# Patient Record
Sex: Male | Born: 1945
Health system: Southern US, Community
[De-identification: ages and names within clinical notes are randomized; demographics above are authoritative.]

## PROBLEM LIST (undated history)

## (undated) DIAGNOSIS — M25519 Pain in unspecified shoulder: Secondary | ICD-10-CM

## (undated) DIAGNOSIS — K5900 Constipation, unspecified: Secondary | ICD-10-CM

## (undated) DIAGNOSIS — R51 Headache: Secondary | ICD-10-CM

## (undated) DIAGNOSIS — R739 Hyperglycemia, unspecified: Secondary | ICD-10-CM

## (undated) DIAGNOSIS — M810 Age-related osteoporosis without current pathological fracture: Secondary | ICD-10-CM

## (undated) DIAGNOSIS — R972 Elevated prostate specific antigen [PSA]: Secondary | ICD-10-CM

## (undated) DIAGNOSIS — E782 Mixed hyperlipidemia: Secondary | ICD-10-CM

## (undated) DIAGNOSIS — M542 Cervicalgia: Secondary | ICD-10-CM

## (undated) DIAGNOSIS — G4733 Obstructive sleep apnea (adult) (pediatric): Secondary | ICD-10-CM

## (undated) DIAGNOSIS — R519 Headache, unspecified: Secondary | ICD-10-CM

## (undated) DIAGNOSIS — T148XXA Other injury of unspecified body region, initial encounter: Secondary | ICD-10-CM

## (undated) DIAGNOSIS — E079 Disorder of thyroid, unspecified: Secondary | ICD-10-CM

## (undated) DIAGNOSIS — M1990 Unspecified osteoarthritis, unspecified site: Secondary | ICD-10-CM

## (undated) DIAGNOSIS — H269 Unspecified cataract: Secondary | ICD-10-CM

## (undated) DIAGNOSIS — G4730 Sleep apnea, unspecified: Secondary | ICD-10-CM

## (undated) DIAGNOSIS — M549 Dorsalgia, unspecified: Secondary | ICD-10-CM

## (undated) DIAGNOSIS — F419 Anxiety disorder, unspecified: Secondary | ICD-10-CM

## (undated) DIAGNOSIS — F32A Depression, unspecified: Secondary | ICD-10-CM

## (undated) DIAGNOSIS — G629 Polyneuropathy, unspecified: Secondary | ICD-10-CM

## (undated) DIAGNOSIS — G43109 Migraine with aura, not intractable, without status migrainosus: Principal | ICD-10-CM

## (undated) DIAGNOSIS — Z8619 Personal history of other infectious and parasitic diseases: Secondary | ICD-10-CM

## (undated) DIAGNOSIS — N401 Enlarged prostate with lower urinary tract symptoms: Secondary | ICD-10-CM

## (undated) DIAGNOSIS — H3530 Unspecified macular degeneration: Secondary | ICD-10-CM

## (undated) DIAGNOSIS — R3915 Urgency of urination: Secondary | ICD-10-CM

## (undated) DIAGNOSIS — Z86010 Personal history of colonic polyps: Secondary | ICD-10-CM

## (undated) DIAGNOSIS — Z9989 Dependence on other enabling machines and devices: Secondary | ICD-10-CM

## (undated) HISTORY — DX: Urgency of urination: R39.15

## (undated) HISTORY — DX: Obstructive sleep apnea (adult) (pediatric): G47.33

## (undated) HISTORY — DX: Constipation, unspecified: K59.00

## (undated) HISTORY — DX: Dorsalgia, unspecified: M54.9

## (undated) HISTORY — DX: Hyperglycemia, unspecified: R73.9

## (undated) HISTORY — PX: TONSILLECTOMY: SUR1361

## (undated) HISTORY — DX: Unspecified macular degeneration: H35.30

## (undated) HISTORY — DX: Depression, unspecified: F32.A

## (undated) HISTORY — DX: Unspecified osteoarthritis, unspecified site: M19.90

## (undated) HISTORY — DX: Headache: R51

## (undated) HISTORY — DX: Pain in unspecified shoulder: M25.519

## (undated) HISTORY — DX: Other injury of unspecified body region, initial encounter: T14.8XXA

## (undated) HISTORY — PX: SKIN GRAFT: SHX250

## (undated) HISTORY — DX: Disorder of thyroid, unspecified: E07.9

## (undated) HISTORY — DX: Dependence on other enabling machines and devices: Z99.89

## (undated) HISTORY — DX: Anxiety disorder, unspecified: F41.9

## (undated) HISTORY — DX: Personal history of other infectious and parasitic diseases: Z86.19

## (undated) HISTORY — DX: Benign prostatic hyperplasia with lower urinary tract symptoms: N40.1

## (undated) HISTORY — DX: Sleep apnea, unspecified: G47.30

## (undated) HISTORY — DX: Polyneuropathy, unspecified: G62.9

## (undated) HISTORY — DX: Elevated prostate specific antigen (PSA): R97.20

## (undated) HISTORY — DX: Unspecified cataract: H26.9

## (undated) HISTORY — DX: Personal history of colonic polyps: Z86.010

## (undated) HISTORY — DX: Age-related osteoporosis without current pathological fracture: M81.0

## (undated) HISTORY — DX: Cervicalgia: M54.2

## (undated) HISTORY — DX: Headache, unspecified: R51.9

## (undated) HISTORY — DX: Mixed hyperlipidemia: E78.2

## (undated) HISTORY — DX: Migraine with aura, not intractable, without status migrainosus: G43.109

---
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
## 2004-04-06 ENCOUNTER — Encounter: Payer: Self-pay | Admitting: Pulmonary Disease

## 2005-07-30 ENCOUNTER — Ambulatory Visit: Payer: Self-pay | Admitting: Internal Medicine

## 2005-09-24 ENCOUNTER — Ambulatory Visit: Payer: Self-pay | Admitting: Internal Medicine

## 2006-02-11 ENCOUNTER — Ambulatory Visit: Payer: Self-pay | Admitting: Internal Medicine

## 2006-02-26 ENCOUNTER — Ambulatory Visit: Payer: Self-pay | Admitting: Internal Medicine

## 2007-05-20 ENCOUNTER — Ambulatory Visit: Payer: Self-pay | Admitting: Internal Medicine

## 2007-05-20 ENCOUNTER — Encounter: Payer: Self-pay | Admitting: Internal Medicine

## 2008-02-03 ENCOUNTER — Encounter: Payer: Self-pay | Admitting: Internal Medicine

## 2008-07-28 ENCOUNTER — Ambulatory Visit: Payer: Self-pay | Admitting: Pulmonary Disease

## 2008-07-28 DIAGNOSIS — G4733 Obstructive sleep apnea (adult) (pediatric): Secondary | ICD-10-CM | POA: Insufficient documentation

## 2008-11-12 ENCOUNTER — Ambulatory Visit: Payer: Self-pay | Admitting: Family Medicine

## 2008-11-12 DIAGNOSIS — L255 Unspecified contact dermatitis due to plants, except food: Secondary | ICD-10-CM

## 2009-06-06 ENCOUNTER — Ambulatory Visit: Payer: Self-pay | Admitting: Internal Medicine

## 2013-12-24 LAB — HM COLONOSCOPY

## 2014-09-01 ENCOUNTER — Ambulatory Visit: Payer: Self-pay | Admitting: Internal Medicine

## 2014-09-01 DIAGNOSIS — R4689 Other symptoms and signs involving appearance and behavior: Secondary | ICD-10-CM | POA: Insufficient documentation

## 2014-09-01 DIAGNOSIS — Z0289 Encounter for other administrative examinations: Secondary | ICD-10-CM

## 2015-04-21 DIAGNOSIS — H43813 Vitreous degeneration, bilateral: Secondary | ICD-10-CM | POA: Diagnosis not present

## 2015-04-21 DIAGNOSIS — H524 Presbyopia: Secondary | ICD-10-CM | POA: Diagnosis not present

## 2015-04-21 DIAGNOSIS — H2513 Age-related nuclear cataract, bilateral: Secondary | ICD-10-CM | POA: Diagnosis not present

## 2015-04-21 DIAGNOSIS — H3530 Unspecified macular degeneration: Secondary | ICD-10-CM | POA: Diagnosis not present

## 2015-06-24 DIAGNOSIS — R972 Elevated prostate specific antigen [PSA]: Secondary | ICD-10-CM | POA: Diagnosis not present

## 2015-06-24 DIAGNOSIS — N400 Enlarged prostate without lower urinary tract symptoms: Secondary | ICD-10-CM | POA: Diagnosis not present

## 2015-07-01 DIAGNOSIS — R972 Elevated prostate specific antigen [PSA]: Secondary | ICD-10-CM | POA: Diagnosis not present

## 2015-07-01 DIAGNOSIS — N401 Enlarged prostate with lower urinary tract symptoms: Secondary | ICD-10-CM | POA: Diagnosis not present

## 2015-07-01 DIAGNOSIS — N529 Male erectile dysfunction, unspecified: Secondary | ICD-10-CM | POA: Diagnosis not present

## 2015-09-24 DIAGNOSIS — H019 Unspecified inflammation of eyelid: Secondary | ICD-10-CM | POA: Diagnosis not present

## 2015-09-24 DIAGNOSIS — H5712 Ocular pain, left eye: Secondary | ICD-10-CM | POA: Diagnosis not present

## 2015-09-24 DIAGNOSIS — H00016 Hordeolum externum left eye, unspecified eyelid: Secondary | ICD-10-CM | POA: Diagnosis not present

## 2015-09-26 DIAGNOSIS — H00014 Hordeolum externum left upper eyelid: Secondary | ICD-10-CM | POA: Diagnosis not present

## 2016-01-23 DIAGNOSIS — R972 Elevated prostate specific antigen [PSA]: Secondary | ICD-10-CM | POA: Diagnosis not present

## 2016-02-08 ENCOUNTER — Encounter: Payer: Self-pay | Admitting: *Deleted

## 2016-02-08 ENCOUNTER — Telehealth: Payer: Self-pay | Admitting: *Deleted

## 2016-02-08 DIAGNOSIS — R972 Elevated prostate specific antigen [PSA]: Secondary | ICD-10-CM | POA: Diagnosis not present

## 2016-02-08 DIAGNOSIS — N529 Male erectile dysfunction, unspecified: Secondary | ICD-10-CM | POA: Diagnosis not present

## 2016-02-08 DIAGNOSIS — N401 Enlarged prostate with lower urinary tract symptoms: Secondary | ICD-10-CM | POA: Diagnosis not present

## 2016-02-08 DIAGNOSIS — N138 Other obstructive and reflux uropathy: Secondary | ICD-10-CM | POA: Diagnosis not present

## 2016-02-08 NOTE — Telephone Encounter (Signed)
Pt unavailable at time of call. Pt will try to return call later today.

## 2016-02-08 NOTE — Telephone Encounter (Signed)
Pre-Visit Call completed with patient and chart updated.   Pre-Visit Info documented in Specialty Comments under [employer].    

## 2016-02-09 ENCOUNTER — Ambulatory Visit (INDEPENDENT_AMBULATORY_CARE_PROVIDER_SITE_OTHER): Payer: Medicare Other | Admitting: Family Medicine

## 2016-02-09 ENCOUNTER — Encounter: Payer: Self-pay | Admitting: Family Medicine

## 2016-02-09 VITALS — BP 122/84 | HR 67 | Temp 98.3°F | Ht 65.0 in | Wt 190.0 lb

## 2016-02-09 DIAGNOSIS — Z860100 Personal history of colon polyps, unspecified: Secondary | ICD-10-CM

## 2016-02-09 DIAGNOSIS — N401 Enlarged prostate with lower urinary tract symptoms: Secondary | ICD-10-CM | POA: Diagnosis not present

## 2016-02-09 DIAGNOSIS — G4733 Obstructive sleep apnea (adult) (pediatric): Secondary | ICD-10-CM

## 2016-02-09 DIAGNOSIS — M25512 Pain in left shoulder: Secondary | ICD-10-CM

## 2016-02-09 DIAGNOSIS — R972 Elevated prostate specific antigen [PSA]: Secondary | ICD-10-CM | POA: Diagnosis not present

## 2016-02-09 DIAGNOSIS — R3915 Urgency of urination: Secondary | ICD-10-CM

## 2016-02-09 DIAGNOSIS — N138 Other obstructive and reflux uropathy: Secondary | ICD-10-CM | POA: Insufficient documentation

## 2016-02-09 DIAGNOSIS — H3530 Unspecified macular degeneration: Secondary | ICD-10-CM

## 2016-02-09 DIAGNOSIS — G43109 Migraine with aura, not intractable, without status migrainosus: Secondary | ICD-10-CM | POA: Diagnosis not present

## 2016-02-09 HISTORY — DX: Personal history of colonic polyps: Z86.010

## 2016-02-09 HISTORY — DX: Migraine with aura, not intractable, without status migrainosus: G43.109

## 2016-02-09 HISTORY — DX: Personal history of colon polyps, unspecified: Z86.0100

## 2016-02-09 HISTORY — DX: Benign prostatic hyperplasia with lower urinary tract symptoms: N40.1

## 2016-02-09 MED ORDER — CYCLOBENZAPRINE HCL 10 MG PO TABS
10.0000 mg | ORAL_TABLET | Freq: Every evening | ORAL | Status: DC | PRN
Start: 2016-02-09 — End: 2016-08-10

## 2016-02-09 NOTE — Assessment & Plan Note (Signed)
Used Flomax previously and it worked somewhat but not enough so he chose to stop. Is urinating well, but does move his urine well still

## 2016-02-09 NOTE — Assessment & Plan Note (Signed)
Follows with Dr Pete Glatter PSA generally between 6-7.5, more recently 7.9 Has had numerous biopsies all at [facility] and with Dr Pete Glatter all normal

## 2016-02-09 NOTE — Progress Notes (Signed)
Subjective:    Patient ID: Christopher Haynes, male    DOB: September 20, 1946, 70 y.o.   MRN: [id number]  Chief Complaint  Patient presents with  . Establish Care    HPI Patient is in today for new patient appointment. Patient has a past medical history significant for migraine headaches, macular degeneration, sleep apnea, colonic polyps. Is complaining of left shoulder pain. Describes stiffness as well as pain across the shoulder and up into the neck. No falls or recent injury. Follows with the [facility] and they manage his CPAP and mask as well as help him with his medications. He does have occasional optical migraines and has been seen by ophthalmology but no recent flare. Denies CP/palp/SOB/HA/congestion/fevers/GI or GU c/o. Taking meds as prescribed  Past Medical History  Diagnosis Date  . OSA on CPAP   . Headache   . Migraine with visual aura 02/09/2016  . H/O measles   . Thyroid disease     thyroiditis, h/o in 37s  . Elevated PSA   . Benign prostatic hyperplasia (BPH) with urinary urgency 02/09/2016  . History of colonic polyps 02/09/2016    [facility] roughly [facility] years ago. Now on 5 year plan  . Macular degeneration of left eye 02/19/2016  . Pain in joint, shoulder region 02/19/2016    Past Surgical History  Procedure Laterality Date  . Tonsillectomy  Age 44  . Skin graft  Age 20  . Ankle fracture surgery  66    Family History  Problem Relation Age of Onset  . Hypertension Mother   . Diabetes Mother   . Colon cancer Father   . Cancer Father     rectal with mets  . Hypertension Brother   . Obesity Son   . Other Son     fatty liver  . Allergic Disorder Son     seasonal  . Stroke Paternal Grandfather     Social History   Social History  . Marital Status: Married    Spouse Name: N/A  . Number of Children: N/A  . Years of Education: N/A   Occupational History  . [employer]    Social History Main Topics  . Smoking status: Never Smoker    . Smokeless tobacco: Not on file  . Alcohol Use: 0.0 oz/week    0 Standard drinks or equivalent per week  . Drug Use: No  . Sexual Activity: Yes     Comment: lives with wife, work in Scientist, research (life sciences) estate, no dietary restrictions   Other Topics Concern  . Not on file   Social History Narrative    Outpatient Prescriptions Prior to Visit  Medication Sig Dispense Refill  . OVER THE COUNTER MEDICATION OTC Iron Supplement     No facility-administered medications prior to visit.    No Active Allergies  Review of Systems  Constitutional: Negative for fever, chills and malaise/fatigue.  HENT: Negative for congestion and hearing loss.   Eyes: Negative for discharge.  Respiratory: Negative for cough, sputum production and shortness of breath.   Cardiovascular: Negative for chest pain, palpitations and leg swelling.  Gastrointestinal: Negative for heartburn, nausea, vomiting, abdominal pain, diarrhea, constipation and blood in stool.  Genitourinary: Negative for dysuria, urgency, frequency and hematuria.  Musculoskeletal: Positive for joint pain. Negative for myalgias, back pain and falls.  Skin: Negative for rash.  Neurological: Negative for dizziness, sensory change, loss of consciousness, weakness and headaches.  Endo/Heme/Allergies: Negative for environmental allergies. Does not bruise/bleed  easily.  Psychiatric/Behavioral: Negative for depression and suicidal ideas. The patient is not nervous/anxious and does not have insomnia.        Objective:    Physical Exam  Constitutional: He is oriented to person, place, and time. He appears well-developed and well-nourished. No distress.  HENT:  Head: Normocephalic and atraumatic.  Nose: Nose normal.  Eyes: Right eye exhibits no discharge. Left eye exhibits no discharge.  Neck: Normal range of motion. Neck supple.  Cardiovascular: Normal rate and regular rhythm.   No murmur heard. Pulmonary/Chest: Effort normal and breath sounds normal.    Abdominal: Soft. Bowel sounds are normal. There is no tenderness.  Musculoskeletal: He exhibits no edema.  Neurological: He is alert and oriented to person, place, and time.  Skin: Skin is warm and dry.  Psychiatric: He has a normal mood and affect.  Nursing note and vitals reviewed.   BP 122/84 mmHg  Pulse 67  Temp(Src) 98.3 F (36.8 C) (Oral)  Ht '5\' 5"'  (1.651 m)  Wt 190 lb (86.183 kg)  BMI 31.62 kg/m2  SpO2 98% Wt Readings from Last 3 Encounters:  02/16/16 184 lb (83.462 kg)  02/09/16 190 lb (86.183 kg)  06/06/09 193 lb 12.8 oz (87.907 kg)     No results found for: WBC, HGB, HCT, PLT, GLUCOSE, CHOL, TRIG, HDL, LDLDIRECT, LDLCALC, ALT, AST, NA, K, CL, CREATININE, BUN, CO2, TSH, PSA, INR, GLUF, HGBA1C, MICROALBUR  No results found for: TSH No results found for: WBC, HGB, HCT, MCV, PLT No results found for: NA, K, CHLORIDE, CO2, GLUCOSE, BUN, CREATININE, BILITOT, ALKPHOS, AST, ALT, PROT, ALBUMIN, CALCIUM, ANIONGAP, EGFR, GFR No results found for: CHOL No results found for: HDL No results found for: LDLCALC No results found for: TRIG No results found for: CHOLHDL No results found for: HGBA1C     Assessment & Plan:   Problem List Items Addressed This Visit    Benign prostatic hyperplasia (BPH) with urinary urgency    Used Flomax previously and it worked somewhat but not enough so he chose to stop. Is urinating well, but does move his urine well still      Elevated PSA    Follows with Dr Felipa Eth PSA generally between 6-7.5, more recently 7.9 Has had numerous biopsies all at [facility] and with Dr Felipa Eth all normal      History of colonic polyps   Macular degeneration of left eye    Follows with opthamology      Migraine with visual aura - Primary    Encouraged increased hydration, 64 ounces of clear fluids daily. Minimize alcohol and caffeine. Eat small frequent meals with lean proteins and complex carbs. Avoid high and low blood sugars. Get adequate sleep, 7-8 hours  a night. Needs exercise daily preferably in the morning.      Relevant Medications   traMADol (ULTRAM) 50 MG tablet   cyclobenzaprine (FLEXERIL) 10 MG tablet   Obstructive sleep apnea    Uses CPAP with full mask[facility] and [facility]ly      Pain in joint, shoulder region    Shoulder and neck. No acute injury. Encouraged moist heat and gentle stretching as tolerated. May try NSAIDs and prescription meds as directed and report if symptoms worsen or seek immediate care. May use Flexeril prn         I am having Mr. Palmeri start on cyclobenzaprine. I am also having him maintain his OVER THE COUNTER MEDICATION, diazepam, traMADol, and Omega-3 Fatty Acids (FISH  OIL PO).  Meds ordered this encounter  Medications  . diazepam (VALIUM) 10 MG tablet    Sig: Take 1 tablet by mouth daily as needed.  . traMADol (ULTRAM) 50 MG tablet    Sig: Take 1 tablet by mouth daily as needed.  . Omega-3 Fatty Acids (FISH OIL PO)    Sig: Take by mouth daily.  . cyclobenzaprine (FLEXERIL) 10 MG tablet    Sig: Take 1 tablet (10 mg total) by mouth at bedtime as needed for muscle spasms.    Dispense:  30 tablet    Refill:  1     Penni Homans, MD

## 2016-02-09 NOTE — Progress Notes (Signed)
Pre visit review using our clinic review tool, if applicable. No additional management support is needed unless otherwise documented below in the visit note. 

## 2016-02-09 NOTE — Patient Instructions (Addendum)
Salon Pas gel or patches regular or Lidocaine and/or Aspercreme gel or lidocaine patches   Back Pain, Adult Back pain is very common in adults.The cause of back pain is rarely dangerous and the pain often gets better over time.The cause of your back pain may not be known. Some common causes of back pain include:  Strain of the muscles or ligaments supporting the spine.  Wear and tear (degeneration) of the spinal disks.  Arthritis.  Direct injury to the back. For many people, back pain may return. Since back pain is rarely dangerous, most people can learn to manage this condition on their own. HOME CARE INSTRUCTIONS Watch your back pain for any changes. The following actions may help to lessen any discomfort you are feeling:  Remain active. It is stressful on your back to sit or stand in one place for long periods of time. Do not sit, drive, or stand in one place for more than 30 minutes at a time. Take short walks on even surfaces as soon as you are able.Try to increase the length of time you walk each day.  Exercise regularly as directed by your health care provider. Exercise helps your back heal faster. It also helps avoid future injury by keeping your muscles strong and flexible.  Do not stay in bed.Resting more than 1-2 days can delay your recovery.  Pay attention to your body when you bend and lift. The most comfortable positions are those that put less stress on your recovering back. Always use proper lifting techniques, including:  Bending your knees.  Keeping the load close to your body.  Avoiding twisting.  Find a comfortable position to sleep. Use a firm mattress and lie on your side with your knees slightly bent. If you lie on your back, put a pillow under your knees.  Avoid feeling anxious or stressed.Stress increases muscle tension and can worsen back pain.It is important to recognize when you are anxious or stressed and learn ways to manage it, such as with  exercise.  Take medicines only as directed by your health care provider. Over-the-counter medicines to reduce pain and inflammation are often the most helpful.Your health care provider may prescribe muscle relaxant drugs.These medicines help dull your pain so you can more quickly return to your normal activities and healthy exercise.  Apply ice to the injured area:  Put ice in a plastic bag.  Place a towel between your skin and the bag.  Leave the ice on for 20 minutes, 2-3 times a day for the first 2-3 days. After that, ice and heat may be alternated to reduce pain and spasms.  Maintain a healthy weight. Excess weight puts extra stress on your back and makes it difficult to maintain good posture. SEEK MEDICAL CARE IF:  You have pain that is not relieved with rest or medicine.  You have increasing pain going down into the legs or buttocks.  You have pain that does not improve in one week.  You have night pain.  You lose weight.  You have a fever or chills. SEEK IMMEDIATE MEDICAL CARE IF:   You develop new bowel or bladder control problems.  You have unusual weakness or numbness in your arms or legs.  You develop nausea or vomiting.  You develop abdominal pain.  You feel faint.   This information is not intended to replace advice given to you by your health care provider. Make sure you discuss any questions you have with your health care provider.  Document Released: 12/10/2005 Document Revised: 12/31/2014 Document Reviewed: 04/13/2014 Elsevier Interactive Patient Education 2016 Else[employer] Inc[employer]   Thoracic Outlet Syndrome Thoracic outlet syndrome (TOS) is a group of signs and symptoms that result when the vein, artery, or nerves that supply your arm and hand are squeezed (compressed). To reach your arm, all of these have to pass through a tight space under your collarbone and above your top rib (thoracic outlet). There are three types of TOS:  Compression of the nerves  that supply your arm and hand is called neurogenic TOS. Most people with TOS have this type.  Compression of the vein that returns blood from your arm and hand (subclavian vein) is called venous TOS.  Compression of the artery that carries blood to your arm and hand (subclavian artery) is called arterial TOS. Arterial TOS is the rarest type. Depending on which structures are affected, you may have symptoms on one side or both sides of your body. CAUSES  Neurogenic TOS may be caused by swelling or scarring in your neck muscles that results in the narrowing of your thoracic outlet. This leads to nerve compression. It can happen from:  Neck injuries from an auto accident (whiplash).  Falls.  Repetitive stress on your neck from working with your arms. This stress could be from using a keyboard all day or working on an assembly line.  Venous TOS may be caused by doing hard work with your arms, especially if you have to lift your arms above your head. A blood clot may form in the vein.  Arterial TOS may be caused by having an extra rib at the base of your neck (cervical rib). This rib presses on your subclavian artery. Over time, this pressure may cause a clot to form inside the artery, or the artery may weaken and balloon outward (aneurysm). RISK FACTORS  You may be at greater risk for neurogenic TOS after a neck injury or repetitive stress on your neck.  You may be at greater risk for venous TOS if you do strenuous and repetitive work with your arms.  You may be at greater risk for arterial TOS if you were born with a cervical rib. Risk factors for any type of TOS include:  Being male.  Being overweight.  Having poor posture. SIGNS AND SYMPTOMS  Your signs and symptoms will depend on the type of TOS that you have. Signs and symptoms of neurogenic TOS may include:  Pain in your shoulder, arm, or hand.  Tingling or numbness in your shoulder, arm, or hand.  Tiredness or weakness  of your shoulder, arm, or hand.  Neck pain.  Headache. Signs and symptoms of venous TOS may include:  Pain and swelling of your whole arm.  Arm skin that is darker than usual. Signs and symptoms of arterial TOS may include:  Pain and cramps in your arm or hand.  Pale arm skin.  Very cold hands. All signs and symptoms of TOS may be worse when you hold your arms over your head. DIAGNOSIS Your health care provider may suspect TOS from your symptoms. A physical exam will be done. During the exam, your health care provider may ask you to hold your arms over your head to check whether your symptoms get worse. Tests may also be done to confirm the diagnosis and to find out what is causing TOS. These may include:  Imaging studies, such as:  X-rays to look for a cervical rib.  A test using sound waves to  create an image (ultrasound).  CT scan.  MRI.  A test that involves measuring and recording the pulses in your wrists (pulse volume recording).  A test that involves measuring the conduction speed of nerve impulses in your arm (nerve conduction velocity test).  A test in which X-rays are done after dye is injected into your subclavian artery or vein (venography or arteriography). TREATMENT  Treatment depends on the type of TOS that you have.  Neurogenic TOS may be treated with:  Physical therapy to learn stretching exercises and good posture.  Occupational therapy to improve your workplace and home environment.  Medicine, including pain medicine, muscle relaxants, and anti-inflammatory medicine.  Surgery to remove scarred neck muscles or the first rib. This is rarely done for this type of TOS.  Venous TOS may be treated with:  Medicine, including blood thinners or blood clot dissolvers.  Surgery to remove a blood clot.  Surgery to remove the uppermost rib to make more space in the thoracic outlet.  Arterial TOS may be treated with surgery to:  Remove the cervical  rib.  Remove a blood clot (thrombus).  Repair an aneurysm. HOME CARE INSTRUCTIONS  Take medicines only as directed by your health care provider.  Maintain a healthy weight. Lose weight as directed by your health care provider.  Do stretching exercises at home as directed by your health care provider or physical therapist.  Maintain good posture.  Do not carry heavy bags over your shoulder.  Do not repetitively lift heavy objects over your head.  Take frequent breaks to stretch and rest your arms if you work at a keyboard or do other repetitive work with your hands and arms.  Keep all follow-up visits as directed by your health care provider. This is important. SEEK MEDICAL CARE IF:  You have pain, cramps, numbness, or tingling in your arm or hand.  Your arm or hand frequently feels tired.  Your arm develops a darker skin color than usual.  Your hand feels cold.  You have frequent headaches or neckaches. SEEK IMMEDIATE MEDICAL CARE IF:   You lose feeling in your arm or hand.  You are unable to move your fingers.  Your fingers turn a dark color.   This information is not intended to replace advice given to you by your health care provider. Make sure you discuss any questions you have with your health care provider.   Document Released: 11/30/2002 Document Revised: 12/31/2014 Document Reviewed: 05/12/2014 Elsevier Interactive Patient Education [employer]

## 2016-02-16 ENCOUNTER — Telehealth: Payer: Self-pay | Admitting: Family Medicine

## 2016-02-16 ENCOUNTER — Encounter (HOSPITAL_BASED_OUTPATIENT_CLINIC_OR_DEPARTMENT_OTHER): Payer: Self-pay | Admitting: *Deleted

## 2016-02-16 ENCOUNTER — Emergency Department (HOSPITAL_BASED_OUTPATIENT_CLINIC_OR_DEPARTMENT_OTHER): Payer: Worker's Compensation

## 2016-02-16 ENCOUNTER — Emergency Department (HOSPITAL_BASED_OUTPATIENT_CLINIC_OR_DEPARTMENT_OTHER)
Admission: EM | Admit: 2016-02-16 | Discharge: 2016-02-16 | Disposition: A | Discharge status: Home / Self Care | Payer: Worker's Compensation | Attending: Emergency Medicine | Admitting: Emergency Medicine

## 2016-02-16 DIAGNOSIS — Y998 Other external cause status: Secondary | ICD-10-CM | POA: Insufficient documentation

## 2016-02-16 DIAGNOSIS — G43109 Migraine with aura, not intractable, without status migrainosus: Secondary | ICD-10-CM | POA: Insufficient documentation

## 2016-02-16 DIAGNOSIS — S20211A Contusion of right front wall of thorax, initial encounter: Secondary | ICD-10-CM

## 2016-02-16 DIAGNOSIS — W11XXXA Fall on and from ladder, initial encounter: Secondary | ICD-10-CM | POA: Diagnosis not present

## 2016-02-16 DIAGNOSIS — Z87438 Personal history of other diseases of male genital organs: Secondary | ICD-10-CM | POA: Insufficient documentation

## 2016-02-16 DIAGNOSIS — R0781 Pleurodynia: Secondary | ICD-10-CM | POA: Diagnosis not present

## 2016-02-16 DIAGNOSIS — Z86010 Personal history of colonic polyps: Secondary | ICD-10-CM | POA: Insufficient documentation

## 2016-02-16 DIAGNOSIS — S0012XA Contusion of left eyelid and periocular area, initial encounter: Secondary | ICD-10-CM | POA: Diagnosis not present

## 2016-02-16 DIAGNOSIS — Y9389 Activity, other specified: Secondary | ICD-10-CM | POA: Diagnosis not present

## 2016-02-16 DIAGNOSIS — G4733 Obstructive sleep apnea (adult) (pediatric): Secondary | ICD-10-CM | POA: Diagnosis not present

## 2016-02-16 DIAGNOSIS — Z8739 Personal history of other diseases of the musculoskeletal system and connective tissue: Secondary | ICD-10-CM | POA: Diagnosis not present

## 2016-02-16 DIAGNOSIS — Y9289 Other specified places as the place of occurrence of the external cause: Secondary | ICD-10-CM | POA: Diagnosis not present

## 2016-02-16 DIAGNOSIS — S20311A Abrasion of right front wall of thorax, initial encounter: Secondary | ICD-10-CM | POA: Diagnosis not present

## 2016-02-16 DIAGNOSIS — Z8719 Personal history of other diseases of the digestive system: Secondary | ICD-10-CM | POA: Diagnosis not present

## 2016-02-16 DIAGNOSIS — S299XXA Unspecified injury of thorax, initial encounter: Secondary | ICD-10-CM | POA: Diagnosis present

## 2016-02-16 DIAGNOSIS — Z79899 Other long term (current) drug therapy: Secondary | ICD-10-CM | POA: Diagnosis not present

## 2016-02-16 MED ORDER — OXYCODONE-ACETAMINOPHEN 5-325 MG PO TABS
2.0000 | ORAL_TABLET | Freq: Once | ORAL | Status: AC
  Administered 2016-02-16: 2 via ORAL
  Filled 2016-02-16: qty 2

## 2016-02-16 MED ORDER — OXYCODONE-ACETAMINOPHEN 5-325 MG PO TABS
2.0000 | ORAL_TABLET | ORAL | Status: DC | PRN
Start: 2016-02-16 — End: 2016-02-20

## 2016-02-16 MED FILL — OXYCODONE/APAP 5-325: 5-325 | 2 days supply | Qty: 20 | Fill #0

## 2016-02-16 NOTE — ED Notes (Signed)
He fell 7' off a ladder landing onto concrete. Injury to his right shoulder and right ribs. Sob. He also hit his left eye.

## 2016-02-16 NOTE — Telephone Encounter (Signed)
[facility] - Client TELEPHONE ADVICE RECORD   [facility]     Patient Name: Christopher Haynes Initial Comment Caller states her husband just fell off a ladder, and having a hard time breathing.   DOB: 04-Jan-1946      Nurse Assessment  Nurse: Tera Mater RN, Elnita Maxwell Date/Time (Eastern Time): 02/16/2016 2:34:14 PM  Confirm and document reason for call. If symptomatic, describe symptoms. You must click the next button to save text entered. ---Caller states that he was approx 3-4 ft up on a ladder when the rung broke and he feel striking his right ribs on the metal and then his head just above the left eye. Pt is c/o sob and redness with bruising to the area.  Has the patient traveled out of the country within the last 30 days? ---Not Applicable  Does the patient have any new or worsening symptoms? ---Yes  Will a triage be completed? ---Yes  Related visit to physician within the last 2 weeks? ---No  Does the PT have any chronic conditions? (i.e. diabetes, asthma, etc.) ---No  Is this a behavioral health or substance abuse call? ---No    Guidelines     Guideline Title Affirmed Question Affirmed Notes   Chest Injury [1] Difficulty breathing AND [2] not severe    Final Disposition User   Go to ED Now Tera Mater, RN, Cheryl     Referrals   [facility] - ED   Disagree/Comply: Comply

## 2016-02-16 NOTE — Telephone Encounter (Signed)
Per patient's chart, he went to the ER as advised.

## 2016-02-16 NOTE — Telephone Encounter (Signed)
Wife called stating that patient had just fallen off of a ladder. Stated that he could not move and was having difficulty breathing. Wife wanted to bring him to the office for an X-Ray. Transferred to [facility]. Spoke with Selena Batten.

## 2016-02-16 NOTE — ED Provider Notes (Signed)
CSN: [id number]     Arrival date & time 02/16/16  1505 History   First MD Initiated Contact with Patient 02/16/16 1510     Chief Complaint  Patient presents with  . Fall      HPI  Patient presents for evaluation after a fall from a ladder. He was up proximally 7 feet off the ground on a ladder. One leg of the ladder broke. He landed on concrete with his right chest wall against the ladder and the ground. Has a contusion above his left eye. No headache or loss of conscious. No neck or back pain. Has abrasion to the skin and tenderness and pain in the right lower ribs. No abdominal pain. No upper or lower extremity pain  Past Medical History  Diagnosis Date  . OSA on CPAP   . Headache   . Migraine with visual aura 02/09/2016  . H/O measles   . 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 Elevated PSA   . Benign prostatic hyperplasia (BPH) with urinary urgency 02/09/2016  . History of colonic polyps 02/09/2016    [facility]. Now on 5 year plan   Past Surgical History  Procedure Laterality Date  . Tonsillectomy  Age 27  . Skin graft  Age 59  . Ankle fracture surgery  66   Family History  Problem Relation Age of Onset  . Hypertension Mother   . Diabetes Mother   . Colon cancer Father   . Cancer Father     rectal with mets  . Hypertension Brother   . Obesity Son   . Other Son     fatty liver  . Allergic Disorder Son     seasonal  . Stroke Paternal Grandfather    Social History  Substance Use Topics  . Smoking status: Never Smoker   . Smokeless tobacco: None  . Alcohol Use: 0.0 oz/week    0 Standard drinks or equivalent per week    Review of Systems  Constitutional: Negative for fever, chills, diaphoresis, appetite change and fatigue.  HENT: Negative for mouth sores, sore throat and trouble swallowing.        Left eyebrow contusion and hematoma  Eyes: Negative for visual disturbance.  Respiratory: Negative for cough,  chest tightness, shortness of breath and wheezing.        Right chest wall abrasion and pain  Cardiovascular: Negative for chest pain.  Gastrointestinal: Negative for nausea, vomiting, abdominal pain, diarrhea and abdominal distention.  Endocrine: Negative for polydipsia, polyphagia and polyuria.  Genitourinary: Negative for dysuria, frequency and hematuria.  Musculoskeletal: Negative for gait problem.  Skin: Negative for color change, pallor and rash.  Neurological: Negative for dizziness, syncope, light-headedness and headaches.  Hematological: Does not bruise/bleed easily.  Psychiatric/Behavioral: Negative for behavioral problems and confusion.      Allergies  Review of patient's allergies indicates no active allergies.  Home Medications   Prior to Admission medications   Medication Sig Start Date End Date Taking? Authorizing Provider  cyclobenzaprine (FLEXERIL) 10 MG tablet Take 1 tablet (10 mg total) by mouth at bedtime as needed for muscle spasms. 02/09/16   Bradd Canary, MD  diazepam (VALIUM) 10 MG tablet Take 1 tablet by mouth daily as needed. 06/22/15   Historical Provider, MD  Omega-3 Fatty Acids (FISH OIL PO) Take by mouth daily.    Historical Provider, MD  OVER THE COUNTER MEDICATION OTC Iron Supplement  Historical Provider, MD  oxyCODONE-acetaminophen (PERCOCET/ROXICET) 5-325 MG tablet Take 2 tablets by mouth every 4 (four) hours as needed. 02/16/16   Rolland Porter, MD  traMADol (ULTRAM) 50 MG tablet Take 1 tablet by mouth daily as needed. 12/06/14   Historical Provider, MD   BP 145/94 mmHg  Pulse 82  Temp(Src) 98.1 F (36.7 C) (Oral)  Resp 18  Ht  (1.651 m)  Wt 184 lb (83.462 kg)  BMI 30.62 kg/m2  SpO2 100% Physical Exam  Constitutional: He is oriented to person, place, and time. He appears well-developed and well-nourished. No distress.  HENT:  Head: Normocephalic.    Eyes: Conjunctivae are normal. Pupils are equal, round, and reactive to light. No  scleral icterus.  Neck: Normal range of motion. Neck supple. No thyromegaly present.  Cardiovascular: Normal rate and regular rhythm.  Exam reveals no gallop and no friction rub.   No murmur heard. Pulmonary/Chest: Effort normal and breath sounds normal. No respiratory distress. He has no wheezes. He has no rales.    Abdominal: Soft. Bowel sounds are normal. He exhibits no distension. There is no tenderness. There is no rebound.  Musculoskeletal: Normal range of motion.  Neurological: He is alert and oriented to person, place, and time.  Skin: Skin is warm and dry. No rash noted.  Psychiatric: He has a normal mood and affect. His behavior is normal.    ED Course  Procedures (including critical care time) Labs Review Labs Reviewed - No data to display  Imaging Review Dg Ribs Unilateral W/chest Right  02/16/2016  CLINICAL DATA:  Right anterior rib pain after falling from ladder today. EXAM: RIGHT RIBS AND CHEST - 3+ VIEW COMPARISON:  None. FINDINGS: Metallic BB was placed over the area of pain near the right lateral costal margin. No underlying rib fracture, pleural effusion or pneumothorax seen. The heart size and mediastinal contours are normal. The lungs are clear. There is deformity of the proximal right humeral diaphysis which appears nonacute. This may be secondary to an osteochondroma or remote fracture. This is incompletely visualized. IMPRESSION: 1. No evidence of acute rib fracture, pleural effusion or pneumothorax. 2. No acute cardiopulmonary process. 3. Proximal right humeral deformity, likely osteochondroma or sequela of remote trauma. Electronically Signed   By: Carey Bullocks M.D.   On: 02/16/2016 16:02   I have personally reviewed and evaluated these images and lab results as part of my medical decision-making.   EKG Interpretation None      MDM   Final diagnoses:  Chest wall contusion, right, initial encounter   No pneumothorax. No obvious rib fractures. Given  Percocet by mouth. Prescription for the same. Discussed pulmonary toilet and incentive spirometry.    Rolland Porter, MD 02/16/16 [phone]

## 2016-02-16 NOTE — Discharge Instructions (Signed)
Chest Contusion °A contusion is a deep bruise. Bruises happen when an injury causes bleeding under the skin. Signs of bruising include pain, puffiness (swelling), and discolored skin. The bruise may turn blue, purple, or yellow.  °HOME CARE °· Put ice on the injured area. °¨ Put ice in a plastic bag. °¨ Place a towel between the skin and the bag. °¨ Leave the ice on for 15-20 minutes at a time, 03-04 times a day for the first 48 hours. °· Only take medicine as told by your doctor. °· Rest. °· Take deep breaths (deep-breathing exercises) as told by your doctor. °· Stop smoking if you smoke. °· Do not lift objects over 5 pounds (2.3 kilograms) for 3 days or longer if told by your doctor. °GET HELP RIGHT AWAY IF:  °· You have more bruising or puffiness. °· You have pain that gets worse. °· You have trouble breathing. °· You are dizzy, weak, or pass out (faint). °· You have blood in your pee (urine) or poop (stool). °· You cough up or throw up (vomit) blood. °· Your puffiness or pain is not helped with medicines. °MAKE SURE YOU:  °· Understand these instructions. °· Will watch your condition. °· Will get help right away if you are not doing well or get worse. °  °This information is not intended to replace advice given to you by your health care provider. Make sure you discuss any questions you have with your health care provider. °  °Document Released: 05/28/2008 Document Revised: 09/03/2012 Document Reviewed: 06/02/2012 °Elsevier Interactive Patient Education ©2016 [employer] ° °

## 2016-02-19 ENCOUNTER — Encounter: Payer: Self-pay | Admitting: Family Medicine

## 2016-02-19 DIAGNOSIS — H3530 Unspecified macular degeneration: Secondary | ICD-10-CM | POA: Insufficient documentation

## 2016-02-19 DIAGNOSIS — M25519 Pain in unspecified shoulder: Secondary | ICD-10-CM

## 2016-02-19 HISTORY — DX: Unspecified macular degeneration: H35.30

## 2016-02-19 HISTORY — DX: Pain in unspecified shoulder: M25.519

## 2016-02-19 NOTE — Assessment & Plan Note (Signed)
Shoulder and neck. No acute injury. Encouraged moist heat and gentle stretching as tolerated. May try NSAIDs and prescription meds as directed and report if symptoms worsen or seek immediate care. May use Flexeril prn

## 2016-02-19 NOTE — Assessment & Plan Note (Signed)
Encouraged increased hydration, 64 ounces of clear fluids daily. Minimize alcohol and caffeine. Eat small frequent meals with lean proteins and complex carbs. Avoid high and low blood sugars. Get adequate sleep, 7-8 hours a night. Needs exercise daily preferably in the morning.  

## 2016-02-19 NOTE — Assessment & Plan Note (Signed)
Follows with opthamology ?

## 2016-02-19 NOTE — Assessment & Plan Note (Signed)
Uses CPAP with full mask, [facility] and uses it nightly

## 2016-02-20 ENCOUNTER — Telehealth: Payer: Self-pay

## 2016-02-20 ENCOUNTER — Encounter: Payer: Self-pay | Admitting: Family Medicine

## 2016-02-20 ENCOUNTER — Ambulatory Visit (INDEPENDENT_AMBULATORY_CARE_PROVIDER_SITE_OTHER): Payer: Medicare Other | Admitting: Family Medicine

## 2016-02-20 VITALS — BP 140/82 | HR 82 | Temp 97.8°F | Ht 65.0 in | Wt 195.0 lb

## 2016-02-20 DIAGNOSIS — N401 Enlarged prostate with lower urinary tract symptoms: Secondary | ICD-10-CM | POA: Diagnosis not present

## 2016-02-20 DIAGNOSIS — K5900 Constipation, unspecified: Secondary | ICD-10-CM | POA: Diagnosis not present

## 2016-02-20 DIAGNOSIS — R1084 Generalized abdominal pain: Secondary | ICD-10-CM

## 2016-02-20 DIAGNOSIS — S20219A Contusion of unspecified front wall of thorax, initial encounter: Secondary | ICD-10-CM

## 2016-02-20 DIAGNOSIS — T148XXA Other injury of unspecified body region, initial encounter: Secondary | ICD-10-CM

## 2016-02-20 DIAGNOSIS — R3915 Urgency of urination: Secondary | ICD-10-CM

## 2016-02-20 HISTORY — DX: Other injury of unspecified body region, initial encounter: T14.8XXA

## 2016-02-20 HISTORY — DX: Constipation, unspecified: K59.00

## 2016-02-20 LAB — URINALYSIS
BILIRUBIN URINE: NEGATIVE
HGB URINE DIPSTICK: NEGATIVE
Ketones, ur: NEGATIVE
LEUKOCYTES UA: NEGATIVE
NITRITE: NEGATIVE
Specific Gravity, Urine: 1.025 (ref 1.000–1.030)
Total Protein, Urine: NEGATIVE
UROBILINOGEN UA: 0.2 (ref 0.0–1.0)
Urine Glucose: NEGATIVE
pH: 6 (ref 5.0–8.0)

## 2016-02-20 LAB — COMPREHENSIVE METABOLIC PANEL
ALBUMIN: 4.1 g/dL (ref 3.5–5.2)
ALK PHOS: 87 U/L (ref 39–117)
ALT: 25 U/L (ref 0–53)
AST: 27 U/L (ref 0–37)
BUN: 13 mg/dL (ref 6–23)
CALCIUM: 9.6 mg/dL (ref 8.4–10.5)
CHLORIDE: 102 meq/L (ref 96–112)
CO2: 31 mEq/L (ref 19–32)
Creatinine, Ser: 0.86 mg/dL (ref 0.40–1.50)
GFR: 93.5 mL/min (ref >60.00)
Glucose, Bld: 108 mg/dL — ABNORMAL HIGH (ref 70–99)
POTASSIUM: 4.3 meq/L (ref 3.5–5.1)
Sodium: 139 mEq/L (ref 135–145)
TOTAL PROTEIN: 7.3 g/dL (ref 6.0–8.3)
Total Bilirubin: 0.4 mg/dL (ref 0.2–1.2)

## 2016-02-20 LAB — CBC
HCT: 41 % (ref 39.0–52.0)
HEMOGLOBIN: 14 g/dL (ref 13.0–17.0)
MCHC: 34.1 g/dL (ref 30.0–36.0)
MCV: 87 fl (ref 78.0–100.0)
PLATELETS: 268 10*3/uL (ref 150.0–400.0)
RBC: 4.71 Mil/uL (ref 4.22–5.81)
RDW: 12.9 % (ref 11.5–15.5)
WBC: 7.4 10*3/uL (ref 4.0–10.5)

## 2016-02-20 MED ORDER — OXYCODONE-ACETAMINOPHEN 10-325 MG PO TABS: 1.0000 | ORAL_TABLET | ORAL | Status: DC | PRN

## 2016-02-20 MED FILL — OXYCODONE-APAP 10-325 TAB: 10-325 | 10 days supply | Qty: 60 | Fill #0

## 2016-02-20 NOTE — Assessment & Plan Note (Signed)
Had some retention initially after abdominal wall contusion but restarted Flomax and urinating well with blood or pain. Will check UA today

## 2016-02-20 NOTE — Assessment & Plan Note (Signed)
S/p contusion. Moving somewhat better now. Encouraged increased hydration and fiber in diet. Daily probiotics. If bowels not moving can use MOM 2 tbls po in 4 oz of warm prune juice by mouth every 2-3 days. If no results then repeat in 4 hours with  Dulcolax suppository pr, may repeat again in 4 more hours as needed. Seek care if symptoms worsen. Consider daily Miralax and/or Dulcolax if symptoms persist.

## 2016-02-20 NOTE — Progress Notes (Signed)
Pre visit review using our clinic review tool, if applicable. No additional management support is needed unless otherwise documented below in the visit note. 

## 2016-02-20 NOTE — Telephone Encounter (Signed)
Pt has an appt scheduled with Dr. Abner Greenspan today (02/20/16) at 11 am.

## 2016-02-20 NOTE — Assessment & Plan Note (Addendum)
Right chest wall after fall from ladder last week, he fell striking his chest wall on metal ladder leg. Was seen in ER and rib xray was negative for fracture. He was given a small amTylenol/Acetaminophen/APAP 3000 mg in 24 hours. Naproxen 220 mg. 1-2 tabs twice daily with foods as needed with food Lidocaine patches as neededount of Percocet which only helped when he took 2. Change to Percocet 10/325 tabs 1 tab po q 4 hours prn severe pain. Ice tid report worsening symptoms with SOB, abdominal pain, constipation etc for further evaluation

## 2016-02-20 NOTE — Progress Notes (Signed)
Patient ID: Christopher Haynes, male   DOB: 06-13-1946, 70 y.o.   MRN: [id number]   Subjective:    Patient ID: [name], male    DOB: 1945-12-29, 70 y.o.   MRN: [id number]  Chief Complaint  Patient presents with  . Fall    HPI Patient is in today for evaluation of pain status post fall off of a ladder. Last week he was at work when the leg of a ladder when out from under him and he fell and his right chest wall/abdomen directly onto the metal leg. He had severe pain quickly. Shortness of breath and even some diaphoresis when the pain is severe. He was seen in the ER and x-ray of the ribs was negative for fracture but his pain is persistent. He was given a small number of Percocet which were somewhat helpful when he took 2 at a time but his pain is persistent. He denies that it is worsening. He did no constipation having trouble moving his bowels just after the fall but it is improving. They have had to give him MiraLAX as well as an enema but now he's moving bowels. They have not seen any bloody or tarry stool. There's been no diarrhea or anorexia. No nausea vomiting. He also difficulty with urinary retention initially but that has improved since they restart Flomax. They denied dysuria or hematuria. He has pain from his right mid axillary chest wall down through his right flank which worsens with position changes, deep breath and coughing.  Past Medical History  Diagnosis Date  . OSA on CPAP   . Headache   . Migraine with visual aura 02/09/2016  . H/O measles   . 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 Elevated PSA   . Benign prostatic hyperplasia (BPH) with urinary urgency 02/09/2016  . History of colonic polyps 02/09/2016    [facility] roughly [facility] years ago. Now on 5 year plan  . Macular degeneration of left eye 02/19/2016  . Pain in joint, shoulder region 02/19/2016  . Contusion 02/20/2016  . Constipation 02/20/2016    Past Surgical History  Procedure  Laterality Date  . Tonsillectomy  Age 35  . Skin graft  Age 19  . Ankle fracture surgery  66    Family History  Problem Relation Age of Onset  . Hypertension Mother   . Diabetes Mother   . Colon cancer Father   . Cancer Father     rectal with mets  . Hypertension Brother   . Obesity Son   . Other Son     fatty liver  . Allergic Disorder Son     seasonal  . Stroke Paternal Grandfather     Social History   Social History  . Marital Status: Married    Spouse Name: N/A  . Number of Children: N/A  . Years of Education: N/A   Occupational History  . [employer]    Social History Main Topics  . Smoking status: Never Smoker   . Smokeless tobacco: Not on file  . Alcohol Use: 0.0 oz/week    0 Standard drinks or equivalent per week  . Drug Use: No  . Sexual Activity: Yes     Comment: lives with wife, work in Scientist, research (life sciences) estate, no dietary restrictions   Other Topics Concern  . Not on file   Social History Narrative    Outpatient Prescriptions Prior to Visit  Medication Sig Dispense Refill  .  cyclobenzaprine (FLEXERIL) 10 MG tablet Take 1 tablet (10 mg total) by mouth at bedtime as needed for muscle spasms. 30 tablet 1  . diazepam (VALIUM) 10 MG tablet Take 1 tablet by mouth daily as needed.    . Omega-3 Fatty Acids (FISH OIL PO) Take by mouth daily.    Marland Kitchen OVER THE COUNTER MEDICATION OTC Iron Supplement    . traMADol (ULTRAM) 50 MG tablet Take 1 tablet by mouth daily as needed.    Marland Kitchen oxyCODONE-acetaminophen (PERCOCET/ROXICET) 5-325 MG tablet Take 2 tablets by mouth every 4 (four) hours as needed. 20 tablet 0   No facility-administered medications prior to visit.    No Active Allergies  Review of Systems  Constitutional: Negative for fever and malaise/fatigue.  HENT: Negative for congestion.   Eyes: Negative for discharge.  Respiratory: Positive for shortness of breath. Negative for cough, sputum production and wheezing.   Cardiovascular: Positive for chest pain.  Negative for palpitations and leg swelling.  Gastrointestinal: Positive for abdominal pain and constipation. Negative for nausea, vomiting, diarrhea, blood in stool and melena.  Genitourinary: Positive for flank pain. Negative for dysuria, urgency, frequency and hematuria.  Musculoskeletal: Positive for myalgias, back pain and falls.  Skin: Negative for rash.  Neurological: Negative for loss of consciousness and headaches.  Endo/Heme/Allergies: Negative for environmental allergies.  Psychiatric/Behavioral: Negative for depression. The patient is not nervous/anxious.        Objective:    Physical Exam  Constitutional: He is oriented to person, place, and time. He appears well-developed and well-nourished. No distress.  HENT:  Head: Normocephalic and atraumatic.  Nose: Nose normal.  Eyes: Right eye exhibits no discharge. Left eye exhibits no discharge.  Neck: Normal range of motion. Neck supple.  Cardiovascular: Normal rate and regular rhythm.   No murmur heard. Pulmonary/Chest: Effort normal and breath sounds normal.  Abdominal: Soft. Bowel sounds are normal. There is no tenderness.  Musculoskeletal: He exhibits no edema.  Neurological: He is alert and oriented to person, place, and time.  Skin: Skin is warm and dry.  Psychiatric: He has a normal mood and affect.  Nursing note and vitals reviewed.   BP 140/82 mmHg  Pulse 82  Temp(Src) 97.8 F (36.6 C) (Oral)  Ht '5\' 5"'  (1.651 m)  Wt 195 lb (88.451 kg)  BMI 32.45 kg/m2  SpO2 99% Wt Readings from Last 3 Encounters:  02/20/16 195 lb (88.451 kg)  02/16/16 184 lb (83.462 kg)  02/09/16 190 lb (86.183 kg)     No results found for: WBC, HGB, HCT, PLT, GLUCOSE, CHOL, TRIG, HDL, LDLDIRECT, LDLCALC, ALT, AST, NA, K, CL, CREATININE, BUN, CO2, TSH, PSA, INR, GLUF, HGBA1C, MICROALBUR  No results found for: TSH No results found for: WBC, HGB, HCT, MCV, PLT No results found for: NA, K, CHLORIDE, CO2, GLUCOSE, BUN, CREATININE, BILITOT,  ALKPHOS, AST, ALT, PROT, ALBUMIN, CALCIUM, ANIONGAP, EGFR, GFR No results found for: CHOL No results found for: HDL No results found for: LDLCALC No results found for: TRIG No results found for: CHOLHDL No results found for: HGBA1C     Assessment & Plan:   Problem List Items Addressed This Visit    Benign prostatic hyperplasia (BPH) with urinary urgency    Had some retention initially after abdominal wall contusion but restarted Flomax and urinating well with blood or pain. Will check UA today      Constipation    S/p contusion. Moving somewhat better now. Encouraged increased hydration and fiber in diet. Daily probiotics. If  bowels not moving can use MOM 2 tbls po in 4 oz of warm prune juice by mouth every 2-3 days. If no results then repeat in 4 hours with  Dulcolax suppository pr, may repeat again in 4 more hours as needed. Seek care if symptoms worsen. Consider daily Miralax and/or Dulcolax if symptoms persist.       Contusion    Right chest wall after fall from ladder last week, he fell striking his chest wall on metal ladder leg. Was seen in ER and rib xray was negative for fracture. He was given a small amTylenol/Acetaminophen/APAP 3000 mg in 24 hours. Naproxen 220 mg. 1-2 tabs twice daily with foods as needed with food Lidocaine patches as neededount of Percocet which only helped when he took 2. Change to Percocet 10/325 tabs 1 tab po q 4 hours prn severe pain. Ice tid report worsening symptoms with SOB, abdominal pain, constipation etc for further evaluation        Other Visit Diagnoses    Generalized abdominal pain    -  Primary    Relevant Orders    CBC    Comprehensive metabolic panel    Urinalysis       I have discontinued Mr. Lasch oxyCODONE-acetaminophen. I am also having him start on oxyCODONE-acetaminophen. Additionally, I am having him maintain his OVER THE COUNTER MEDICATION, diazepam, traMADol, Omega-3 Fatty Acids (FISH OIL PO), and cyclobenzaprine.  Meds  ordered this encounter  Medications  . oxyCODONE-acetaminophen (PERCOCET) 10-325 MG tablet    Sig: Take 1 tablet by mouth every 4 (four) hours as needed for pain.    Dispense:  60 tablet    Refill:  0     Penni Homans, MD
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
## 2016-02-20 NOTE — Patient Instructions (Addendum)
Encouraged increased hydration and fiber in diet. Daily probiotics. If bowels not moving can use MOM 2 tbls po in 4 oz of warm prune juice by mouth every 2-3 days. If no results then repeat in 4 hours with  Dulcolax suppository pr, may repeat again in 4 more hours as needed. Seek care if symptoms worsen. Consider daily Miralax and/or Dulcolax if symptoms persist.   Tylenol/Acetaminophen/APAP 3000 mg in 24 hours  Naproxen 220 mg. 1-2 tabs twice daily with foods as needed with food  Lidocaine patches as needed  Cardiac Contusion Cardiac contusion is an injury, or bruise, to the heart. With cardiac contusion, the chambers of the heart (atria and ventricles) are injured by strong impact (trauma) to the chest area. Mild injuries to the heart may cause no symptoms. More serious trauma to the heart may cause pain and irregular heartbeat. In rare cases, it can lead to shock and death. Depending on the trauma or accident, other body parts, such as the lungs and ribs, may also be injured. Prompt treatment is important to avoid complications. CAUSES  Chest trauma is the main cause. This may happen due to:   A car or bike accident.  Sports.  CPR.  Falling. RISK FACTORS  Driving recklessly or without a seat belt.  Playing contact sports.  SIGNS AND SYMPTOMS Symptoms of cardiac contusion may include:   Chest pain and discomfort.  Shortness of breath.  Fast or irregular heartbeat.  Bruising and skin discoloration.  Weakness.  Nausea or vomiting.  Passing out.  DIAGNOSIS  Diagnosis may include:   Medical history and physical exam.  Blood tests.  Chest X-ray.  Electrocardiogram (ECG).  Echocardiogram. An abdominal ultrasound and more detailed heart imaging may be performed to identify further injury. TREATMENT  Treatment depends on the severity of the contusion. In mild cases, treatment is not necessary. You may need:  Monitoring for 1-2 days.  Rest and supportive care at  home.  Follow-up examination by your health care provider. In more severe cases, treatment may include:  Medicines to manage fluids, pain, blood pressure, and heart rhythm. These may be given through an IV tube (intravenously).  Oxygen therapy.  Pacemaker to manage heart rhythm.  Ventilation to assist with breathing.  Chest tube to drain fluids.  Surgery to repair damaged structures in the heart. HOME CARE INSTRUCTIONS  Rest while your injury heals as directed by your health care provider.  Limit your activity as directed by your health care provider. Only return to your daily activities, such as work and sports, once your health care provider has approved.  Take medicines only as directed by your health care provider.  Do not drive or operate heavy machinery while taking pain medicine.  Apply ice to the injured area:  Put ice in a plastic bag.  Place a towel between your skin and the bag.  Leave the ice on for 20 minutes, 2-3 times a day.  Keep all follow-up visits as directed by your health care provider. This is important. SEEK MEDICAL CARE IF:   You feel weak or short of breath.  You have changes in your heartbeat.  Your pain is not controlled by medicine.  You have new symptoms. SEEK IMMEDIATE MEDICAL CARE IF:  You have severe chest pain.  You have an irregular heartbeat.  You have pain in your calf or leg. MAKE SURE YOU:   Understand these instructions.  Will watch your condition.  Will get help right away if you are  not doing well or get worse.   This information is not intended to replace advice given to you by your health care provider. Make sure you discuss any questions you have with your health care provider.   Document Released: 07/07/2014 Document Revised: 08/31/2015 Document Reviewed: 07/07/2014 Elsevier Interactive Patient Education [employer]

## 2016-04-23 DIAGNOSIS — H2513 Age-related nuclear cataract, bilateral: Secondary | ICD-10-CM | POA: Diagnosis not present

## 2016-04-23 DIAGNOSIS — H524 Presbyopia: Secondary | ICD-10-CM | POA: Diagnosis not present

## 2016-04-23 DIAGNOSIS — H353131 Nonexudative age-related macular degeneration, bilateral, early dry stage: Secondary | ICD-10-CM | POA: Diagnosis not present

## 2016-04-23 DIAGNOSIS — H1131 Conjunctival hemorrhage, right eye: Secondary | ICD-10-CM | POA: Diagnosis not present

## 2016-07-31 DIAGNOSIS — R972 Elevated prostate specific antigen [PSA]: Secondary | ICD-10-CM | POA: Diagnosis not present

## 2016-08-07 DIAGNOSIS — R972 Elevated prostate specific antigen [PSA]: Secondary | ICD-10-CM | POA: Diagnosis not present

## 2016-08-07 DIAGNOSIS — N401 Enlarged prostate with lower urinary tract symptoms: Secondary | ICD-10-CM | POA: Diagnosis not present

## 2016-08-07 DIAGNOSIS — N529 Male erectile dysfunction, unspecified: Secondary | ICD-10-CM | POA: Diagnosis not present

## 2016-08-07 DIAGNOSIS — N138 Other obstructive and reflux uropathy: Secondary | ICD-10-CM | POA: Diagnosis not present

## 2016-08-10 ENCOUNTER — Encounter: Payer: Self-pay | Admitting: Family Medicine

## 2016-08-10 ENCOUNTER — Telehealth: Payer: Self-pay | Admitting: Family Medicine

## 2016-08-10 ENCOUNTER — Ambulatory Visit (INDEPENDENT_AMBULATORY_CARE_PROVIDER_SITE_OTHER): Payer: Medicare Other | Admitting: Family Medicine

## 2016-08-10 DIAGNOSIS — R739 Hyperglycemia, unspecified: Secondary | ICD-10-CM | POA: Diagnosis not present

## 2016-08-10 DIAGNOSIS — M5489 Other dorsalgia: Secondary | ICD-10-CM | POA: Diagnosis not present

## 2016-08-10 MED ORDER — CYCLOBENZAPRINE HCL 10 MG PO TABS: 10.0000 mg | ORAL_TABLET | Freq: Every evening | ORAL | 2 refills | Status: DC | PRN

## 2016-08-10 NOTE — Progress Notes (Signed)
Pre visit review using our clinic review tool, if applicable. No additional management support is needed unless otherwise documented below in the visit note. 

## 2016-08-10 NOTE — Telephone Encounter (Signed)
[facility]

## 2016-08-10 NOTE — Patient Instructions (Signed)
Get Hep C  results and an immunization [facility] from [facility] Try the     Back Pain, Adult Back pain is very common in adults.The cause of back pain is rarely dangerous and the pain often gets better over time.The cause of your back pain may not be known. Some common causes of back pain include:  Strain of the muscles or ligaments supporting the spine.  Wear and tear (degeneration) of the spinal disks.  Arthritis.  Direct injury to the back. For many people, back pain may return. Since back pain is rarely dangerous, most people can learn to manage this condition on their own. HOME CARE INSTRUCTIONS Watch your back pain for any changes. The following actions may help to lessen any discomfort you are feeling:  Remain active. It is stressful on your back to sit or stand in one place for long periods of time. Do not sit, drive, or stand in one place for more than 30 minutes at a time. Take short walks on even surfaces as soon as you are able.Try to increase the length of time you walk each day.  Exercise regularly as directed by your health care provider. Exercise helps your back heal faster. It also helps avoid future injury by keeping your muscles strong and flexible.  Do not stay in bed.Resting more than 1-2 days can delay your recovery.  Pay attention to your body when you bend and lift. The most comfortable positions are those that put less stress on your recovering back. Always use proper lifting techniques, including:  Bending your knees.  Keeping the load close to your body.  Avoiding twisting.  Find a comfortable position to sleep. Use a firm mattress and lie on your side with your knees slightly bent. If you lie on your back, put a pillow under your knees.  Avoid feeling anxious or stressed.Stress increases muscle tension and can worsen back pain.It is important to recognize when you are anxious or stressed and learn ways to manage it, such as with exercise.  Take medicines  only as directed by your health care provider. Over-the-counter medicines to reduce pain and inflammation are often the most helpful.Your health care provider may prescribe muscle relaxant drugs.These medicines help dull your pain so you can more quickly return to your normal activities and healthy exercise.  Apply ice to the injured area:  Put ice in a plastic bag.  Place a towel between your skin and the bag.  Leave the ice on for 20 minutes, 2-3 times a day for the first 2-3 days. After that, ice and heat may be alternated to reduce pain and spasms.  Maintain a healthy weight. Excess weight puts extra stress on your back and makes it difficult to maintain good posture. SEEK MEDICAL CARE IF:  You have pain that is not relieved with rest or medicine.  You have increasing pain going down into the legs or buttocks.  You have pain that does not improve in one week.  You have night pain.  You lose weight.  You have a fever or chills. SEEK IMMEDIATE MEDICAL CARE IF:   You develop new bowel or bladder control problems.  You have unusual weakness or numbness in your arms or legs.  You develop nausea or vomiting.  You develop abdominal pain.  You feel faint.   This information is not intended to replace advice given to you by your health care provider. Make sure you discuss any questions you have with your health care provider.  Document Released: 12/10/2005 Document Revised: 12/31/2014 Document Reviewed: 04/13/2014 Elsevier Interactive Patient Education [employer].

## 2016-08-20 ENCOUNTER — Encounter: Payer: Self-pay | Admitting: Family Medicine

## 2016-08-20 DIAGNOSIS — M549 Dorsalgia, unspecified: Secondary | ICD-10-CM

## 2016-08-20 DIAGNOSIS — R739 Hyperglycemia, unspecified: Secondary | ICD-10-CM

## 2016-08-20 HISTORY — DX: Dorsalgia, unspecified: M54.9

## 2016-08-20 HISTORY — DX: Hyperglycemia, unspecified: R73.9

## 2016-08-20 NOTE — Progress Notes (Signed)
Patient ID: Christopher Haynes, male   DOB: [birth year], 70 y.o.   MRN: [id number]   Subjective:    Patient ID: Christopher Haynes, male    DOB: 07-15-1946, 70 y.o.   MRN: [id number]  Chief Complaint  Patient presents with  . Follow-up    HPI Patient is in today for follow up. Is struggling with intermittent back pain and diffuse myalgias at times. No injury or falls, no incontinence or radiculopathy. Denies CP/palp/SOB/HA/congestion/fevers/GI or GU c/o. Taking meds as prescribed  Past Medical History:  Diagnosis Date  . Back pain 08/20/2016  . Benign prostatic hyperplasia (BPH) with urinary urgency 02/09/2016  . Constipation 02/20/2016  . Contusion 02/20/2016  . Elevated PSA   . H/O measles   . Headache   . History of colonic polyps 02/09/2016   [facility]. Now on 5 year plan  . Hyperglycemia 08/20/2016  . Macular degeneration of left eye 02/19/2016  . Migraine with visual aura 02/09/2016  . OSA on CPAP   . Pain in joint, shoulder region 02/19/2016  . Thyroid disease    thyroiditis, h/o in 56s    Past Surgical History:  Procedure Laterality Date  . ANKLE FRACTURE SURGERY  66  . SKIN GRAFT  Age 62  . TONSILLECTOMY  Age 48    Family History  Problem Relation Age of Onset  . Hypertension Mother   . Diabetes Mother   . Colon cancer Father   . Cancer Father     rectal with mets  . Hypertension Brother   . Obesity Son   . Other Son     fatty liver  . Allergic Disorder Son     seasonal  . Stroke Paternal Grandfather     Social History   Social History  . Marital status: Married    Spouse name: N/A  . Number of children: N/A  . Years of education: N/A   Occupational History  . [employer]    Social History Main Topics  . Smoking status: Never Smoker  . Smokeless tobacco: Not on file  . Alcohol use 0.0 oz/week  . Drug use: No  . 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 Not on file   Social History Narrative  . No narrative on file    Outpatient Medications Prior to Visit  Medication Sig Dispense Refill  . diazepam (VALIUM) 10 MG tablet Take 1 tablet by mouth daily as needed.    . Omega-3 Fatty Acids (FISH OIL PO) Take by mouth daily.    Marland Kitchen OVER THE COUNTER MEDICATION OTC Iron Supplement    . oxyCODONE-acetaminophen (PERCOCET) 10-325 MG tablet Take 1 tablet by mouth every 4 (four) hours as needed for pain. 60 tablet 0  . traMADol (ULTRAM) 50 MG tablet Take 1 tablet by mouth daily as needed.    . cyclobenzaprine (FLEXERIL) 10 MG tablet Take 1 tablet (10 mg total) by mouth at bedtime as needed for muscle spasms. 30 tablet 1   No facility-administered medications prior to visit.     No Active Allergies  Review of Systems  Constitutional: Negative for fever and malaise/fatigue.  HENT: Negative for congestion.   Eyes: Negative for blurred vision.  Respiratory: Negative for shortness of breath.   Cardiovascular: Negative for chest pain, palpitations and leg swelling.  Gastrointestinal: Negative for abdominal pain, blood in  stool and nausea.  Genitourinary: Negative for dysuria and frequency.  Musculoskeletal: Positive for back pain. Negative for falls.  Skin: Negative for rash.  Neurological: Negative for dizziness, loss of consciousness and headaches.  Endo/Heme/Allergies: Negative for environmental allergies.  Psychiatric/Behavioral: Negative for depression. The patient is not nervous/anxious.        Objective:    Physical Exam  Constitutional: He is oriented to person, place, and time. He appears well-developed and well-nourished. No distress.  HENT:  Head: Normocephalic and atraumatic.  Nose: Nose normal.  Eyes: Right eye exhibits no discharge. Left eye exhibits no discharge.  Neck: Normal range of motion. Neck supple.  Cardiovascular: Normal rate and regular rhythm.   No murmur heard. Pulmonary/Chest: Effort  normal and breath sounds normal.  Abdominal: Soft. Bowel sounds are normal. There is no tenderness.  Musculoskeletal: He exhibits no edema.  Neurological: He is alert and oriented to person, place, and time.  Skin: Skin is warm and dry.  Psychiatric: He has a normal mood and affect.  Nursing note and vitals reviewed.   BP 122/72 (BP Location: Left Arm, Patient Position: Sitting, Cuff Size: Normal)   Pulse (!) 57   Temp 98.1 F (36.7 C) (Oral)   Ht 5\' 5"  (1.651 m)   Wt 184 lb (83.5 kg)   SpO2 96%   BMI 30.62 kg/m  Wt Readings from Last 3 Encounters:  08/10/16 184 lb (83.5 kg)  02/20/16 195 lb (88.5 kg)  02/16/16 184 lb (83.5 kg)     Lab Results  Component Value Date   WBC 7.4 02/20/2016   HGB 14.0 02/20/2016   HCT 41.0 02/20/2016   PLT 268.0 02/20/2016   GLUCOSE 108 (H) 02/20/2016   ALT 25 02/20/2016   AST 27 02/20/2016   NA 139 02/20/2016   K 4.3 02/20/2016   CL 102 02/20/2016   CREATININE 0.86 02/20/2016   BUN 13 02/20/2016   CO2 31 02/20/2016    No results found for: TSH Lab Results  Component Value Date   WBC 7.4 02/20/2016   HGB 14.0 02/20/2016   HCT 41.0 02/20/2016   MCV 87.0 02/20/2016   PLT 268.0 02/20/2016   Lab Results  Component Value Date   NA 139 02/20/2016   K 4.3 02/20/2016   CO2 31 02/20/2016   GLUCOSE 108 (H) 02/20/2016   BUN 13 02/20/2016   CREATININE 0.86 02/20/2016   BILITOT 0.4 02/20/2016   ALKPHOS 87 02/20/2016   AST 27 02/20/2016   ALT 25 02/20/2016   PROT 7.3 02/20/2016   ALBUMIN 4.1 02/20/2016   CALCIUM 9.6 02/20/2016   GFR 93.50 02/20/2016   No results found for: CHOL No results found for: HDL No results found for: LDLCALC No results found for: TRIG No results found for: CHOLHDL No results found for: ONGE9B     Assessment & Plan:   Problem List Items Addressed This Visit    Back pain    Encouraged moist heat and gentle stretching as tolerated. May try NSAIDs and prescription meds as directed and report if  symptoms worsen or seek immediate care. Encouraged topical treatments, given refill on Flexeril to use prn      Relevant Medications   cyclobenzaprine (FLEXERIL) 10 MG tablet   Hyperglycemia     minimize simple carbs. Increase exercise as tolerated.        Other Visit Diagnoses   None.     I am having Mr. Creelman maintain his OVER THE COUNTER MEDICATION, diazepam, traMADol,  Omega-3 Fatty Acids (FISH OIL PO), oxyCODONE-acetaminophen, and cyclobenzaprine.  Meds ordered this encounter  Medications  . cyclobenzaprine (FLEXERIL) 10 MG tablet    Sig: Take 1 tablet (10 mg total) by mouth at bedtime as needed for muscle spasms.    Dispense:  30 tablet    Refill:  2     Danise EdgeBLYTH, Cleota Pellerito, MD

## 2016-08-20 NOTE — Assessment & Plan Note (Signed)
minimize simple carbs. Increase exercise as tolerated.  

## 2016-08-20 NOTE — Assessment & Plan Note (Signed)
Encouraged moist heat and gentle stretching as tolerated. May try NSAIDs and prescription meds as directed and report if symptoms worsen or seek immediate care. Encouraged topical treatments, given refill on Flexeril to use prn

## 2017-02-08 ENCOUNTER — Encounter: Payer: Self-pay | Admitting: Family Medicine

## 2017-02-08 ENCOUNTER — Ambulatory Visit (INDEPENDENT_AMBULATORY_CARE_PROVIDER_SITE_OTHER): Payer: Medicare Other | Admitting: Family Medicine

## 2017-02-08 VITALS — BP 140/78 | HR 73 | Temp 98.3°F | Wt 191.6 lb

## 2017-02-08 DIAGNOSIS — G8929 Other chronic pain: Secondary | ICD-10-CM

## 2017-02-08 DIAGNOSIS — G4733 Obstructive sleep apnea (adult) (pediatric): Secondary | ICD-10-CM

## 2017-02-08 DIAGNOSIS — E782 Mixed hyperlipidemia: Secondary | ICD-10-CM | POA: Diagnosis not present

## 2017-02-08 DIAGNOSIS — Z0000 Encounter for general adult medical examination without abnormal findings: Secondary | ICD-10-CM | POA: Diagnosis not present

## 2017-02-08 DIAGNOSIS — R972 Elevated prostate specific antigen [PSA]: Secondary | ICD-10-CM | POA: Diagnosis not present

## 2017-02-08 DIAGNOSIS — M549 Dorsalgia, unspecified: Secondary | ICD-10-CM | POA: Diagnosis not present

## 2017-02-08 DIAGNOSIS — R739 Hyperglycemia, unspecified: Secondary | ICD-10-CM

## 2017-02-08 DIAGNOSIS — K5900 Constipation, unspecified: Secondary | ICD-10-CM

## 2017-02-08 HISTORY — DX: Mixed hyperlipidemia: E78.2

## 2017-02-08 LAB — CBC
HCT: 41.8 % (ref 39.0–52.0)
Hemoglobin: 14 g/dL (ref 13.0–17.0)
MCHC: 33.6 g/dL (ref 30.0–36.0)
MCV: 88.7 fl (ref 78.0–100.0)
Platelets: 229 10*3/uL (ref 150.0–400.0)
RBC: 4.71 Mil/uL (ref 4.22–5.81)
RDW: 13.2 % (ref 11.5–15.5)
WBC: 6.5 10*3/uL (ref 4.0–10.5)

## 2017-02-08 LAB — COMPREHENSIVE METABOLIC PANEL
ALT: 16 U/L (ref 0–53)
AST: 15 U/L (ref 0–37)
Albumin: 3.9 g/dL (ref 3.5–5.2)
Alkaline Phosphatase: 89 U/L (ref 39–117)
BUN: 18 mg/dL (ref 6–23)
CHLORIDE: 109 meq/L (ref 96–112)
CO2: 27 meq/L (ref 19–32)
CREATININE: 0.85 mg/dL (ref 0.40–1.50)
Calcium: 8.9 mg/dL (ref 8.4–10.5)
GFR: 94.51 mL/min (ref >60.00)
GLUCOSE: 108 mg/dL — AB (ref 70–99)
Potassium: 4.5 mEq/L (ref 3.5–5.1)
Sodium: 143 mEq/L (ref 135–145)
Total Bilirubin: 0.3 mg/dL (ref 0.2–1.2)
Total Protein: 6.2 g/dL (ref 6.0–8.3)

## 2017-02-08 LAB — LIPID PANEL
CHOL/HDL RATIO: 4
Cholesterol: 205 mg/dL — ABNORMAL HIGH (ref 0–200)
HDL: 48.9 mg/dL (ref >39.00)
LDL CALC: 137 mg/dL — AB (ref 0–99)
NONHDL: 156.08
Triglycerides: 95 mg/dL (ref 0.0–149.0)
VLDL: 19 mg/dL (ref 0.0–40.0)

## 2017-02-08 LAB — PSA: PSA: 7.85 ng/mL — ABNORMAL HIGH (ref 0.10–4.00)

## 2017-02-08 LAB — HEMOGLOBIN A1C: HEMOGLOBIN A1C: 6.1 % (ref 4.6–6.5)

## 2017-02-08 LAB — TSH: TSH: 1.84 u[IU]/mL (ref 0.35–4.50)

## 2017-02-08 NOTE — Progress Notes (Addendum)
Patient ID: Christopher Haynes, male   DOB: 1946/04/07, 71 y.o.   MRN: [id number]   Subjective:    Patient ID: Christopher Haynes, male    DOB: 04/02/1946, 71 y.o.   MRN: [id number]  Chief Complaint  Patient presents with  . Annual Exam  I acted as a Neurosurgeon for Dr. Abner Greenspan. Princess, RMA   HPI  Patient is in today for an annual exam and follow up on numerous medical concerns. He struggles with hyperglycemia, hyperlipidemia, back pain, constipation and long history or elevated PSA. No new or concerning urinary symptoms. Denies CP/palp/SOB/HA/congestion/fevers/GI or GU c/o. Taking meds as prescribed. Back pain is stable and uses Tramadol sparingly with good response. Is doing well with ADLs at home and tries to maintain a heart healthy diet.   Past Medical History:  Diagnosis Date  . Back pain 08/20/2016  . Benign prostatic hyperplasia (BPH) with urinary urgency 02/09/2016  . Constipation 02/20/2016  . Contusion 02/20/2016  . Elevated PSA   . H/O measles   . Headache   . History of colonic polyps 02/09/2016   [facility]. Now on 5 year plan  . Hyperglycemia 08/20/2016  . Hyperlipidemia, mixed 02/08/2017  . Macular degeneration of left eye 02/19/2016  . Migraine with visual aura 02/09/2016  . OSA on CPAP   . Pain in joint, shoulder region 02/19/2016  . Thyroid disease    thyroiditis, h/o in 24s    Past Surgical History:  Procedure Laterality Date  . ANKLE FRACTURE SURGERY  66  . SKIN GRAFT  Age 71  . TONSILLECTOMY  Age 57    Family History  Problem Relation Age of Onset  . Hypertension Mother   . Diabetes Mother   . Colon cancer Father   . Cancer Father     rectal with mets  . Hypertension Brother   . Obesity Son   . Other Son     fatty liver  . Allergic Disorder Son     seasonal  . Stroke Paternal Grandfather     Social History   Social History  . Marital status: Married    Spouse name: N/A  . Number of children: N/A  . Years of  education: N/A   Occupational History  . [employer]    Social History Main Topics  . Smoking status: Never Smoker  . Smokeless tobacco: Never Used  . Alcohol use 0.0 oz/week  . Drug use: No  . 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 Not on file   Social History Narrative  . No narrative on file    Outpatient Medications Prior to Visit  Medication Sig Dispense Refill  . cyclobenzaprine (FLEXERIL) 10 MG tablet Take 1 tablet (10 mg total) by mouth at bedtime as needed for muscle spasms. 30 tablet 2  . OVER THE COUNTER MEDICATION OTC Iron Supplement    . diazepam (VALIUM) 10 MG tablet Take 1 tablet by mouth daily as needed.    . Omega-3 Fatty Acids (FISH OIL PO) Take by mouth daily.    . traMADol (ULTRAM) 50 MG tablet Take 1 tablet by mouth daily as needed.    Marland Kitchen oxyCODONE-acetaminophen (PERCOCET) 10-325 MG tablet Take 1 tablet by mouth every 4 (four) hours as needed for pain. (Patient not taking: Reported on 02/08/2017) 60 tablet 0   No facility-administered medications prior  to visit.     Allergies  Allergen Reactions  . Aspirin Hives    Patient states that takes Aspirin regularly    Review of Systems  Constitutional: Negative for fever and malaise/fatigue.  HENT: Negative for congestion.   Eyes: Negative for blurred vision.  Respiratory: Negative for shortness of breath.   Cardiovascular: Negative for chest pain, palpitations and leg swelling.  Gastrointestinal: Positive for constipation. Negative for abdominal pain, blood in stool and nausea.  Genitourinary: Negative for dysuria and frequency.  Musculoskeletal: Positive for back pain. Negative for falls.  Skin: Negative for rash.  Neurological: Negative for dizziness, loss of consciousness and headaches.  Endo/Heme/Allergies: Negative for environmental allergies.  Psychiatric/Behavioral: Negative for depression. The patient is not  nervous/anxious.        Objective:    Physical Exam  Constitutional: He is oriented to person, place, and time. He appears well-developed and well-nourished. No distress.  HENT:  Head: Normocephalic and atraumatic.  Nose: Nose normal.  Eyes: Right eye exhibits no discharge. Left eye exhibits no discharge.  Neck: Normal range of motion. Neck supple.  Cardiovascular: Normal rate and regular rhythm.   No murmur heard. Pulmonary/Chest: Effort normal and breath sounds normal.  Abdominal: Soft. Bowel sounds are normal. There is no tenderness.  Musculoskeletal: He exhibits no edema.  Neurological: He is alert and oriented to person, place, and time.  Skin: Skin is warm and dry.  Psychiatric: He has a normal mood and affect.  Nursing note and vitals reviewed.   BP 140/78 (BP Location: Left Arm, Patient Position: Sitting, Cuff Size: Normal)   Pulse 73   Temp 98.3 F (36.8 C) (Oral)   Wt 191 lb 9.6 oz (86.9 kg)   SpO2 100%   BMI 31.88 kg/m  Wt Readings from Last 3 Encounters:  02/08/17 191 lb 9.6 oz (86.9 kg)  08/10/16 184 lb (83.5 kg)  02/20/16 195 lb (88.5 kg)     Lab Results  Component Value Date   WBC 6.5 02/08/2017   HGB 14.0 02/08/2017   HCT 41.8 02/08/2017   PLT 229.0 02/08/2017   GLUCOSE 108 (H) 02/08/2017   CHOL 205 (H) 02/08/2017   TRIG 95.0 02/08/2017   HDL 48.90 02/08/2017   LDLCALC 137 (H) 02/08/2017   ALT 16 02/08/2017   AST 15 02/08/2017   NA 143 02/08/2017   K 4.5 02/08/2017   CL 109 02/08/2017   CREATININE 0.85 02/08/2017   BUN 18 02/08/2017   CO2 27 02/08/2017   TSH 1.84 02/08/2017   PSA 7.85 (H) 02/08/2017   HGBA1C 6.1 02/08/2017    Lab Results  Component Value Date   TSH 1.84 02/08/2017   Lab Results  Component Value Date   WBC 6.5 02/08/2017   HGB 14.0 02/08/2017   HCT 41.8 02/08/2017   MCV 88.7 02/08/2017   PLT 229.0 02/08/2017   Lab Results  Component Value Date   NA 143 02/08/2017   K 4.5 02/08/2017   CO2 27 02/08/2017    GLUCOSE 108 (H) 02/08/2017   BUN 18 02/08/2017   CREATININE 0.85 02/08/2017   BILITOT 0.3 02/08/2017   ALKPHOS 89 02/08/2017   AST 15 02/08/2017   ALT 16 02/08/2017   PROT 6.2 02/08/2017   ALBUMIN 3.9 02/08/2017   CALCIUM 8.9 02/08/2017   GFR 94.51 02/08/2017   Lab Results  Component Value Date   CHOL 205 (H) 02/08/2017   Lab Results  Component Value Date   HDL 48.90 02/08/2017  Lab Results  Component Value Date   LDLCALC 137 (H) 02/08/2017   Lab Results  Component Value Date   TRIG 95.0 02/08/2017   Lab Results  Component Value Date   CHOLHDL 4 02/08/2017   Lab Results  Component Value Date   HGBA1C 6.1 02/08/2017       Assessment & Plan:   Problem List Items Addressed This Visit    Obstructive sleep apnea    [facility] [place] Using CPAP machine checked annually      Relevant Orders   CBC (Completed)   Comprehensive metabolic panel (Completed)   Elevated PSA    Follows with Dr Pete GlatterStoneKing      Relevant Orders   PSA (Completed)   Constipation    Encouraged increased hydration and fiber in diet. Daily probiotics. If bowels not moving can use MOM 2 tbls po in 4 oz of warm prune juice by mouth every 2-3 days. If no results then repeat in 4 hours with  Dulcolax suppository pr, may repeat again in 4 more hours as needed. Seek care if symptoms worsen. Consider daily Miralax and/or Dulcolax if symptoms persist.       Relevant Orders   CBC (Completed)   Comprehensive metabolic panel (Completed)   Back pain    Encouraged moist heat and gentle stretching as tolerated. May try NSAIDs and prescription meds as directed and report if symptoms worsen or seek immediate care. Tramadol prn sparingly      Hyperglycemia    hgba1c acceptable, minimize simple carbs. Increase exercise as tolerated.       Relevant Orders   Hemoglobin A1c (Completed)   CBC (Completed)   Comprehensive metabolic panel (Completed)   Hyperlipidemia, mixed   Relevant Orders    Lipid panel (Completed)   TSH (Completed)   Medicare annual wellness visit, subsequent - Primary    Patient denies any difficulties at home. No trouble with ADLs, depression or falls. See EMR for functional status screen and depression screen. No recent changes to vision or hearing. Is UTD with immunizations. Is UTD with screening. Discussed Advanced Directives. Encouraged heart healthy diet, exercise as tolerated and adequate sleep. See patient's problem list for health risk factors to monitor. See AVS for preventative healthcare recommendation schedule. [facility] for [facility] of other providers.  No concerns regarding ADLs, memory, vision or hearing. See sunfction in [facility] for further documentation.          I have discontinued Mr. Devota PaceDavila's oxyCODONE-acetaminophen. I am also having him maintain his OVER THE COUNTER MEDICATION, diazepam, traMADol, Omega-3 Fatty Acids (FISH OIL PO), and cyclobenzaprine.  No orders of the defined types were placed in this encounter.   CMA served as Neurosurgeonscribe during this visit. History, Physical and Plan performed by medical provider. Documentation and orders reviewed and attested to.  Danise EdgeStacey Blyth, MD

## 2017-02-08 NOTE — Assessment & Plan Note (Signed)
[facility] [place] Using CPAP machine checked annually

## 2017-02-08 NOTE — Assessment & Plan Note (Signed)
Encouraged increased hydration and fiber in diet. Daily probiotics. If bowels not moving can use MOM 2 tbls po in 4 oz of warm prune juice by mouth every 2-3 days. If no results then repeat in 4 hours with  Dulcolax suppository pr, may repeat again in 4 more hours as needed. Seek care if symptoms worsen. Consider daily Miralax and/or Dulcolax if symptoms persist.  

## 2017-02-08 NOTE — Patient Instructions (Signed)
DASH diet and MIND diet Preventive Care 65 Years and Older, Male Preventive care refers to lifestyle choices and visits with your health care provider that can promote health and wellness. What does preventive care include?  A yearly physical exam. This is also called an annual well check.  Dental exams once or twice a year.  Routine eye exams. Ask your health care provider how often you should have your eyes checked.  Personal lifestyle choices, including:  Daily care of your teeth and gums.  Regular physical activity.  Eating a healthy diet.  Avoiding tobacco and drug use.  Limiting alcohol use.  Practicing safe sex.  Taking low doses of aspirin every day.  Taking vitamin and mineral supplements as recommended by your health care provider. What happens during an annual well check? The services and screenings done by your health care provider during your annual well check will depend on your age, overall health, lifestyle risk factors, and family history of disease. Counseling  Your health care provider may ask you questions about your:  Alcohol use.  Tobacco use.  Drug use.  Emotional well-being.  Home and relationship well-being.  Sexual activity.  Eating habits.  History of falls.  Memory and ability to understand (cognition).  Work and work Statistician. Screening  You may have the following tests or measurements:  Height, weight, and BMI.  Blood pressure.  Lipid and cholesterol levels. These may be checked every 5 years, or more frequently if you are over 35 years old.  Skin check.  Lung cancer screening. You may have this screening every year starting at age 62 if you have a 30-pack-year history of smoking and currently smoke or have quit within the past 15 years.  Fecal occult blood test (FOBT) of the stool. You may have this test every year starting at age 66.  Flexible sigmoidoscopy or colonoscopy. You may have a sigmoidoscopy every 5 years  or a colonoscopy every 10 years starting at age 94.  Prostate cancer screening. Recommendations will vary depending on your family history and other risks.  Hepatitis C blood test.  Hepatitis B blood test.  Sexually transmitted disease (STD) testing.  Diabetes screening. This is done by checking your blood sugar (glucose) after you have not eaten for a while (fasting). You may have this done every 1-3 years.  Abdominal aortic aneurysm (AAA) screening. You may need this if you are a current or former smoker.  Osteoporosis. You may be screened starting at age 54 if you are at high risk. Talk with your health care provider about your test results, treatment options, and if necessary, the need for more tests. Vaccines  Your health care provider may recommend certain vaccines, such as:  Influenza vaccine. This is recommended every year.  Tetanus, diphtheria, and acellular pertussis (Tdap, Td) vaccine. You may need a Td booster every 10 years.  Varicella vaccine. You may need this if you have not been vaccinated.  Zoster vaccine. You may need this after age 40.  Measles, mumps, and rubella (MMR) vaccine. You may need at least one dose of MMR if you were born in 1957 or later. You may also need a second dose.  Pneumococcal 13-valent conjugate (PCV13) vaccine. One dose is recommended after age 65.  Pneumococcal polysaccharide (PPSV23) vaccine. One dose is recommended after age 61.  Meningococcal vaccine. You may need this if you have certain conditions.  Hepatitis A vaccine. You may need this if you have certain conditions or if you travel  or work in places where you may be exposed to hepatitis A.  Hepatitis B vaccine. You may need this if you have certain conditions or if you travel or work in places where you may be exposed to hepatitis B.  Haemophilus influenzae type b (Hib) vaccine. You may need this if you have certain risk factors. Talk to your health care provider about which  screenings and vaccines you need and how often you need them. This information is not intended to replace advice given to you by your health care provider. Make sure you discuss any questions you have with your health care provider. Document Released: 01/06/2016 Document Revised: 08/29/2016 Document Reviewed: 10/11/2015 Elsevier Interactive Patient Education  2017 [employer].

## 2017-02-08 NOTE — Assessment & Plan Note (Signed)
hgba1c acceptable, minimize simple carbs. Increase exercise as tolerated.  

## 2017-02-08 NOTE — Assessment & Plan Note (Signed)
Follows with Dr StoneKing.  

## 2017-02-08 NOTE — Progress Notes (Signed)
Pre visit review using our clinic review tool, if applicable. No additional management support is needed unless otherwise documented below in the visit note. 

## 2017-02-11 DIAGNOSIS — Z0000 Encounter for general adult medical examination without abnormal findings: Secondary | ICD-10-CM | POA: Insufficient documentation

## 2017-02-11 NOTE — Assessment & Plan Note (Addendum)
Patient denies any difficulties at home. No trouble with ADLs, depression or falls. See EMR for functional status screen and depression screen. No recent changes to vision or hearing. Is UTD with immunizations. Is UTD with screening. Discussed Advanced Directives. Encouraged heart healthy diet, exercise as tolerated and adequate sleep. See patient's problem list for health risk factors to monitor. See AVS for preventative healthcare recommendation schedule. See Care Team function in [facility] for names of other providers.  No concerns regarding ADLs, memory, vision or hearing. See sunfction in EPIC for further documentation.

## 2017-02-11 NOTE — Assessment & Plan Note (Signed)
Encouraged moist heat and gentle stretching as tolerated. May try NSAIDs and prescription meds as directed and report if symptoms worsen or seek immediate care. Tramadol prn sparingly

## 2017-04-24 DIAGNOSIS — R972 Elevated prostate specific antigen [PSA]: Secondary | ICD-10-CM | POA: Diagnosis not present

## 2017-04-25 DIAGNOSIS — H353132 Nonexudative age-related macular degeneration, bilateral, intermediate dry stage: Secondary | ICD-10-CM | POA: Diagnosis not present

## 2017-04-25 DIAGNOSIS — H25813 Combined forms of age-related cataract, bilateral: Secondary | ICD-10-CM | POA: Diagnosis not present

## 2017-04-25 DIAGNOSIS — H43813 Vitreous degeneration, bilateral: Secondary | ICD-10-CM | POA: Diagnosis not present

## 2017-04-25 DIAGNOSIS — H524 Presbyopia: Secondary | ICD-10-CM | POA: Diagnosis not present

## 2017-05-03 DIAGNOSIS — N401 Enlarged prostate with lower urinary tract symptoms: Secondary | ICD-10-CM | POA: Diagnosis not present

## 2017-05-03 DIAGNOSIS — N529 Male erectile dysfunction, unspecified: Secondary | ICD-10-CM | POA: Diagnosis not present

## 2017-05-03 DIAGNOSIS — N138 Other obstructive and reflux uropathy: Secondary | ICD-10-CM | POA: Diagnosis not present

## 2017-05-03 DIAGNOSIS — R972 Elevated prostate specific antigen [PSA]: Secondary | ICD-10-CM | POA: Diagnosis not present

## 2017-05-15 DIAGNOSIS — N420 Calculus of prostate: Secondary | ICD-10-CM | POA: Diagnosis not present

## 2017-05-15 DIAGNOSIS — R972 Elevated prostate specific antigen [PSA]: Secondary | ICD-10-CM | POA: Diagnosis not present

## 2017-08-05 ENCOUNTER — Encounter: Payer: Self-pay | Admitting: Family Medicine

## 2017-08-05 ENCOUNTER — Ambulatory Visit (INDEPENDENT_AMBULATORY_CARE_PROVIDER_SITE_OTHER): Payer: Medicare Other | Admitting: Family Medicine

## 2017-08-05 DIAGNOSIS — G629 Polyneuropathy, unspecified: Secondary | ICD-10-CM

## 2017-08-05 DIAGNOSIS — R739 Hyperglycemia, unspecified: Secondary | ICD-10-CM | POA: Diagnosis not present

## 2017-08-05 DIAGNOSIS — Z79891 Long term (current) use of opiate analgesic: Secondary | ICD-10-CM | POA: Diagnosis not present

## 2017-08-05 DIAGNOSIS — Z79899 Other long term (current) drug therapy: Secondary | ICD-10-CM | POA: Diagnosis not present

## 2017-08-05 DIAGNOSIS — G6289 Other specified polyneuropathies: Secondary | ICD-10-CM | POA: Diagnosis not present

## 2017-08-05 DIAGNOSIS — M542 Cervicalgia: Secondary | ICD-10-CM

## 2017-08-05 DIAGNOSIS — F419 Anxiety disorder, unspecified: Secondary | ICD-10-CM | POA: Diagnosis not present

## 2017-08-05 DIAGNOSIS — R972 Elevated prostate specific antigen [PSA]: Secondary | ICD-10-CM | POA: Diagnosis not present

## 2017-08-05 DIAGNOSIS — E782 Mixed hyperlipidemia: Secondary | ICD-10-CM

## 2017-08-05 DIAGNOSIS — M25562 Pain in left knee: Secondary | ICD-10-CM

## 2017-08-05 DIAGNOSIS — M25561 Pain in right knee: Secondary | ICD-10-CM | POA: Insufficient documentation

## 2017-08-05 DIAGNOSIS — I824Z9 Acute embolism and thrombosis of unspecified deep veins of unspecified distal lower extremity: Secondary | ICD-10-CM | POA: Insufficient documentation

## 2017-08-05 HISTORY — DX: Anxiety disorder, unspecified: F41.9

## 2017-08-05 HISTORY — DX: Polyneuropathy, unspecified: G62.9

## 2017-08-05 HISTORY — DX: Cervicalgia: M54.2

## 2017-08-05 LAB — COMPREHENSIVE METABOLIC PANEL
ALBUMIN: 4 g/dL (ref 3.5–5.2)
ALK PHOS: 82 U/L (ref 39–117)
ALT: 23 U/L (ref 0–53)
AST: 23 U/L (ref 0–37)
BUN: 19 mg/dL (ref 6–23)
CALCIUM: 9 mg/dL (ref 8.4–10.5)
CHLORIDE: 104 meq/L (ref 96–112)
CO2: 30 mEq/L (ref 19–32)
CREATININE: 0.82 mg/dL (ref 0.40–1.50)
GFR: 98.37 mL/min (ref >60.00)
Glucose, Bld: 107 mg/dL — ABNORMAL HIGH (ref 70–99)
POTASSIUM: 4 meq/L (ref 3.5–5.1)
Sodium: 141 mEq/L (ref 135–145)
Total Bilirubin: 0.5 mg/dL (ref 0.2–1.2)
Total Protein: 6.1 g/dL (ref 6.0–8.3)

## 2017-08-05 LAB — HEMOGLOBIN A1C: Hgb A1c MFr Bld: 6 % (ref 4.6–6.5)

## 2017-08-05 MED ORDER — DIAZEPAM 10 MG PO TABS: 10.0000 mg | ORAL_TABLET | Freq: Every day | ORAL | 2 refills | Status: DC | PRN

## 2017-08-05 MED ORDER — TRAMADOL HCL 50 MG PO TABS: 50.0000 mg | ORAL_TABLET | Freq: Every day | ORAL | 0 refills | Status: DC | PRN

## 2017-08-05 NOTE — Assessment & Plan Note (Signed)
Is struggling with increased stress at work and in the world in general. Is allowed a refill of Diazepam to use prn just not with the Tramadol, uses for insomnia prn

## 2017-08-05 NOTE — Assessment & Plan Note (Signed)
Worse after over use. Uses the Tramadol with good relief a couple times a month. Refill given

## 2017-08-05 NOTE — Assessment & Plan Note (Signed)
Felt a pop in left knee while trying to help move a bar about 3 weeks ago. Improving but still significant pain if hits or twists it. Has been alternating ice and heat and it is improving, will notify us if does not resolve

## 2017-08-05 NOTE — Assessment & Plan Note (Signed)
hgba1c acceptable, minimize simple carbs. Increase exercise as tolerated. Check level again

## 2017-08-05 NOTE — Assessment & Plan Note (Signed)
Encouraged heart healthy diet, increase exercise, avoid trans fats, consider a krill oil cap daily 

## 2017-08-05 NOTE — Assessment & Plan Note (Signed)
L>R foot, add a vitamin B complex and monitor

## 2017-08-05 NOTE — Patient Instructions (Addendum)
Try some Lidocaine gel on top of the Diclofenac, Aspercreme, [employer] and [address]. Shingrix new shingles shot 2 shots over 6 months, can be obtained at the pharmacy Prevnar  Is cheaper at pharmacy  Vitamin B complex for the foot  Knee Pain, Adult Knee pain in adults is common. It can be caused by many things, including:  Arthritis.  A fluid-filled sac (cyst) or growth in your knee.  An infection in your knee.  An injury that will not heal.  Damage, swelling, or irritation of the tissues that support your knee.  Knee pain is usually not a sign of a serious problem. The pain may go away on its own with time and rest. If it does not, a health care provider may order tests to find the cause of the pain. These may include:  Imaging tests, such as an X-ray, MRI, or ultrasound.  Joint aspiration. In this test, fluid is removed from the knee.  Arthroscopy. In this test, a lighted tube is inserted into knee and an image is projected onto a TV screen.  A biopsy. In this test, a sample of tissue is removed from the body and studied under a microscope.  Follow these instructions at home: Pay attention to any changes in your symptoms. Take these actions to relieve your pain. Activity  Rest your knee.  Do not do things that cause pain or make pain worse.  Avoid high-impact activities or exercises, such as running, jumping rope, or doing jumping jacks. General instructions  Take over-the-counter and prescription medicines only as told by your health care provider.  Raise (elevate) your knee above the level of your heart when you are sitting or lying down.  Sleep with a pillow under your knee.  If directed, apply ice to the knee: ? Put ice in a plastic bag. ? Place a towel between your skin and the bag. ? Leave the ice on for 20 minutes, 2-3 times a day.  Ask your health care provider if you should wear an elastic knee support.  Lose weight if you are overweight. Extra weight  can put pressure on your knee.  Do not use any products that contain nicotine or tobacco, such as cigarettes and e-cigarettes. Smoking may slow the healing of any bone and joint problems that you may have. If you need help quitting, ask your health care provider. Contact a health care provider if:  Your knee pain continues, changes, or gets worse.  You have a fever along with knee pain.  Your knee buckles or locks up.  Your knee swells, and the swelling becomes worse. Get help right away if:  Your knee feels warm to the touch.  You cannot move your knee.  You have severe pain in your knee.  You have chest pain.  You have trouble breathing. Summary  Knee pain in adults is common. It can be caused by many things, including, arthritis, infection, cysts, or injury.  Knee pain is usually not a sign of a serious problem, but if it does not go away, a health care provider may perform tests to know the cause of the pain.  Pay attention to any changes in your symptoms. Relieve your pain with rest, medicines, light activity, and use of ice.  Get help if your pain continues or becomes very severe, or if your knee buckles or locks up, or if you have chest pain or trouble breathing. This information is not intended to replace advice given to you  by your health care provider. Make sure you discuss any questions you have with your health care provider. Document Released: 10/07/2007 Document Revised: 11/30/2016 Document Reviewed: 11/30/2016 Elsevier Interactive Patient Education  [employer]2018 Elsevier Inc.

## 2017-08-05 NOTE — Assessment & Plan Note (Signed)
Is following with Dr [facility].

## 2017-08-05 NOTE — Progress Notes (Signed)
Subjective:  I acted as a Neurosurgeon for Dr. Abner Greenspan. [place], [place]  Patient ID: Christopher Haynes, male    DOB: [birth year], 71 y.o.   MRN: [id number]  No chief complaint on file.   HPI  Patient is in today for 6 month follow up. Patient has no  Acute concerns. About 3 weeks ago he was lifting a heavy bar and interstitial his left knee. Felt a bit of a pop. No swelling or warmth or redness that he has been bracing and alternating ice and heat with good results. Does believe it is on the mend. No recent febrile illness or hospitalization. Does acknowledge some recent worsening of numbness in his feet. Right is beginning to have symptoms versus left has had symptoms for years secondary to an old injury. Denies CP/palp/SOB/HA/congestion/fevers/GI or GU c/o. Taking meds as prescribed  Patient Care Team: Bradd Canary, MD as PCP - General (Family Medicine) Pete Glatter, Danford Bad., MD as Referring Physician (Urology) Mckinley Jewel, MD as Consulting Physician (Ophthalmology) Floyde Parkins, MD (Unknown Physician Specialty)   Past Medical History:  Diagnosis Date  . Anxiety 08/05/2017  . Back pain 08/20/2016  . Benign prostatic hyperplasia (BPH) with urinary urgency 02/09/2016  . Constipation 02/20/2016  . Contusion 02/20/2016  . Elevated PSA   . H/O measles   . Headache   . History of colonic polyps 02/09/2016   [facility]. Now on 5 year plan  . Hyperglycemia 08/20/2016  . Hyperlipidemia, mixed 02/08/2017  . Macular degeneration of left eye 02/19/2016  . Migraine with visual aura 02/09/2016  . Neck pain 08/05/2017  . OSA on CPAP   . Pain in joint, shoulder region 02/19/2016  . Peripheral neuropathy 08/05/2017  . Thyroid disease    thyroiditis, h/o in 35s    Past Surgical History:  Procedure Laterality Date  . ANKLE FRACTURE SURGERY  66  . SKIN GRAFT  Age 20  . TONSILLECTOMY  Age 30    Family History  Problem Relation Age of Onset  .  Hypertension Mother   . Diabetes Mother   . Colon cancer Father   . Cancer Father        rectal with mets  . Hypertension Brother   . Obesity Son   . Other Son        fatty liver  . Allergic Disorder Son        seasonal  . Stroke Paternal Grandfather     Social History   Social History  . Marital status: Married    Spouse name: N/A  . Number of children: N/A  . Years of education: N/A   Occupational History  . [employer]    Social History Main Topics  . Smoking status: Never Smoker  . Smokeless tobacco: Never Used  . Alcohol use 0.0 oz/week  . Drug use: No  . 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 Not on file   Social History Narrative  . No narrative on file    Outpatient Medications Prior to Visit  Medication Sig Dispense Refill  . cyclobenzaprine (FLEXERIL) 10 MG tablet Take 1 tablet (10 mg total) by mouth at bedtime as needed for muscle spasms. 30 tablet 2  . Omega-3 Fatty Acids (FISH OIL PO) Take by mouth daily.    Marland Kitchen OVER THE COUNTER MEDICATION OTC Iron Supplement    .  diazepam (VALIUM) 10 MG tablet Take 1 tablet by mouth daily as needed.    . traMADol (ULTRAM) 50 MG tablet Take 1 tablet by mouth daily as needed.     No facility-administered medications prior to visit.     Allergies  Allergen Reactions  . Aspirin Hives    Patient states that takes Aspirin regularly    Review of Systems  Constitutional: Negative for fever and malaise/fatigue.  HENT: Negative for congestion.   Eyes: Negative for blurred vision.  Respiratory: Negative for cough and shortness of breath.   Cardiovascular: Negative for chest pain, palpitations and leg swelling.  Gastrointestinal: Negative for vomiting.  Musculoskeletal: Positive for joint pain. Negative for back pain.  Skin: Negative for rash.  Neurological: Negative for loss of consciousness and headaches.       Objective:     Physical Exam  Constitutional: He is oriented to person, place, and time. He appears well-developed and well-nourished. No distress.  HENT:  Head: Normocephalic and atraumatic.  Eyes: Conjunctivae are normal.  Neck: Normal range of motion. No thyromegaly present.  Cardiovascular: Normal rate and regular rhythm.   Pulmonary/Chest: Effort normal and breath sounds normal. He has no wheezes.  Abdominal: Soft. Bowel sounds are normal. There is no tenderness.  Musculoskeletal: Normal range of motion. He exhibits no edema or deformity.  Neurological: He is alert and oriented to person, place, and time.  Skin: Skin is warm and dry. He is not diaphoretic.  Psychiatric: He has a normal mood and affect.    BP 136/80 (BP Location: Left Arm, Patient Position: Sitting, Cuff Size: Normal)   Pulse 99   Temp 98.4 F (36.9 C) (Oral)   Resp 18   Wt 186 lb 6.4 oz (84.6 kg)   SpO2 94%   BMI 31.02 kg/m  Wt Readings from Last 3 Encounters:  08/05/17 186 lb 6.4 oz (84.6 kg)  02/08/17 191 lb 9.6 oz (86.9 kg)  08/10/16 184 lb (83.5 kg)   BP Readings from Last 3 Encounters:  08/05/17 136/80  02/08/17 140/78  08/10/16 122/72     Immunization History  Administered Date(s) Administered  . Influenza-Unspecified 09/24/2015  . Pneumococcal-Unspecified 12/24/2009  . Tdap 12/25/2011    Health Maintenance  Topic Date Due  . PNA vac Low Risk Adult (1 of 2 - PCV13) 05/09/2011  . INFLUENZA VACCINE  07/24/2017  . COLONOSCOPY  12/24/2018  . TETANUS/TDAP  12/24/2021  . Hepatitis C Screening  Completed    Lab Results  Component Value Date   WBC 6.5 02/08/2017   HGB 14.0 02/08/2017   HCT 41.8 02/08/2017   PLT 229.0 02/08/2017   GLUCOSE 108 (H) 02/08/2017   CHOL 205 (H) 02/08/2017   TRIG 95.0 02/08/2017   HDL 48.90 02/08/2017   LDLCALC 137 (H) 02/08/2017   ALT 16 02/08/2017   AST 15 02/08/2017   NA 143 02/08/2017   K 4.5 02/08/2017   CL 109 02/08/2017   CREATININE 0.85 02/08/2017   BUN 18  02/08/2017   CO2 27 02/08/2017   TSH 1.84 02/08/2017   PSA 7.85 (H) 02/08/2017   HGBA1C 6.1 02/08/2017    Lab Results  Component Value Date   TSH 1.84 02/08/2017   Lab Results  Component Value Date   WBC 6.5 02/08/2017   HGB 14.0 02/08/2017   HCT 41.8 02/08/2017   MCV 88.7 02/08/2017   PLT 229.0 02/08/2017   Lab Results  Component Value Date   NA 143 02/08/2017   K  4.5 02/08/2017   CO2 27 02/08/2017   GLUCOSE 108 (H) 02/08/2017   BUN 18 02/08/2017   CREATININE 0.85 02/08/2017   BILITOT 0.3 02/08/2017   ALKPHOS 89 02/08/2017   AST 15 02/08/2017   ALT 16 02/08/2017   PROT 6.2 02/08/2017   ALBUMIN 3.9 02/08/2017   CALCIUM 8.9 02/08/2017   GFR 94.51 02/08/2017   Lab Results  Component Value Date   CHOL 205 (H) 02/08/2017   Lab Results  Component Value Date   HDL 48.90 02/08/2017   Lab Results  Component Value Date   LDLCALC 137 (H) 02/08/2017   Lab Results  Component Value Date   TRIG 95.0 02/08/2017   Lab Results  Component Value Date   CHOLHDL 4 02/08/2017   Lab Results  Component Value Date   HGBA1C 6.1 02/08/2017         Assessment & Plan:   Problem List Items Addressed This Visit    Elevated PSA    Is [facility] with [facility]bin [facility] at [facility].      Hyperglycemia    hgba1c acceptable, minimize simple carbs. Increase exercise as tolerated. Check level again      Relevant Orders   Comprehensive metabolic panel   Hemoglobin A1c   Hyperlipidemia, mixed    Encouraged heart healthy diet, increase exercise, avoid trans fats, consider a krill oil cap daily      Neck pain    Worse after over use. Uses the Tramadol with good relief a couple times a month. Refill given      Anxiety    Is struggling with increased stress at work and in the world in general. Is allowed a refill of Diazepam to use prn just not with the Tramadol, uses for insomnia prn      Relevant Medications   diazepam (VALIUM) 10 MG tablet   Knee pain, left    Felt a pop in  left knee while trying to help move a bar about 3 weeks ago. Improving but still significant pain if hits or twists it. Has been alternating ice and heat and it is improving, will notify us if does not resolve      Peripheral neuropathy    L>R foot, add a vitamin B complex and monitor      Relevant Medications   diazepam (VALIUM) 10 MG tablet      I have changed Mr. Frix diazepam and traMADol. I am also having him maintain his OVER THE COUNTER MEDICATION, Omega-3 Fatty Acids (FISH OIL PO), and cyclobenzaprine.  Meds ordered this encounter  Medications  . diazepam (VALIUM) 10 MG tablet    Sig: Take 1 tablet (10 mg total) by mouth daily as needed.    Dispense:  30 tablet    Refill:  2  . traMADol (ULTRAM) 50 MG tablet    Sig: Take 1 tablet (50 mg total) by mouth daily as needed.    Dispense:  30 tablet    Refill:  0    CMA served as scribe during this visit. History, Physical and Plan performed by medical provider. Documentation and orders reviewed and attested to.  Danise Edge, MD

## 2017-09-19 ENCOUNTER — Ambulatory Visit (INDEPENDENT_AMBULATORY_CARE_PROVIDER_SITE_OTHER): Payer: Medicare Other | Admitting: Family Medicine

## 2017-09-19 ENCOUNTER — Ambulatory Visit (HOSPITAL_BASED_OUTPATIENT_CLINIC_OR_DEPARTMENT_OTHER)
Admission: RE | Admit: 2017-09-19 | Discharge: 2017-09-19 | Disposition: A | Discharge status: Home / Self Care | Payer: Medicare Other | Source: Ambulatory Visit | Attending: Family Medicine | Admitting: Family Medicine

## 2017-09-19 ENCOUNTER — Encounter: Payer: Self-pay | Admitting: Family Medicine

## 2017-09-19 VITALS — BP 118/80

## 2017-09-19 DIAGNOSIS — M25562 Pain in left knee: Secondary | ICD-10-CM

## 2017-09-19 DIAGNOSIS — S8992XA Unspecified injury of left lower leg, initial encounter: Secondary | ICD-10-CM | POA: Diagnosis not present

## 2017-09-19 MED ORDER — TRAMADOL HCL 50 MG PO TABS: 50.0000 mg | ORAL_TABLET | Freq: Every day | ORAL | 0 refills | Status: AC | PRN

## 2017-09-19 MED FILL — traMADol HCL 50 MG TABS: 50 | 30 days supply | Qty: 30 | Fill #0

## 2017-09-19 NOTE — Progress Notes (Signed)
Patient ID: Christopher Haynes, male    DOB: [birth year]  Age: 71 y.o. MRN: [id number]    Subjective:  Subjective  HPI [name] presents for L knee pain --  He accidentally twisted his knee while  Review of Systems  Constitutional: Negative.   HENT: Negative for congestion, ear pain, hearing loss, nosebleeds, postnasal drip, rhinorrhea, sinus pressure, sneezing and tinnitus.   Eyes: Negative for photophobia, discharge, itching and visual disturbance.  Respiratory: Negative.   Cardiovascular: Negative.   Gastrointestinal: Negative for abdominal distention, abdominal pain, anal bleeding, blood in stool and constipation.  Endocrine: Negative.   Genitourinary: Negative.   Musculoskeletal: Positive for joint swelling.  Skin: Negative.   Allergic/Immunologic: Negative.   Neurological: Negative for dizziness, weakness, light-headedness, numbness and headaches.  Psychiatric/Behavioral: Negative for agitation, confusion, decreased concentration, dysphoric mood, sleep disturbance and suicidal ideas. The patient is not nervous/anxious.     History Past Medical History:  Diagnosis Date  . Anxiety 08/05/2017  . Back pain 08/20/2016  . Benign prostatic hyperplasia (BPH) with urinary urgency 02/09/2016  . Constipation 02/20/2016  . Contusion 02/20/2016  . Elevated PSA   . H/O measles   . Headache   . History of colonic polyps 02/09/2016   [facility]. Now on 5 year plan  . Hyperglycemia 08/20/2016  . Hyperlipidemia, mixed 02/08/2017  . Macular degeneration of left eye 02/19/2016  . Migraine with visual aura 02/09/2016  . Neck pain 08/05/2017  . OSA on CPAP   . Pain in joint, shoulder region 02/19/2016  . Peripheral neuropathy 08/05/2017  . Thyroid disease    thyroiditis, h/o in 24s    He has a past surgical history that includes Tonsillectomy (Age 56); Skin graft (Age 25); and Ankle fracture surgery (66).   His family history includes Allergic Disorder  in his son; Cancer in his father; Colon cancer in his father; Diabetes in his mother; Hypertension in his brother and mother; Obesity in his son; Other in his son; Stroke in his paternal grandfather.He reports that he has never smoked. He has never used smokeless tobacco. He reports that he drinks alcohol. He reports that he does not use drugs.  Current Outpatient Prescriptions on File Prior to Visit  Medication Sig Dispense Refill  . cyclobenzaprine (FLEXERIL) 10 MG tablet Take 1 tablet (10 mg total) by mouth at bedtime as needed for muscle spasms. 30 tablet 2  . diazepam (VALIUM) 10 MG tablet Take 1 tablet (10 mg total) by mouth daily as needed. 30 tablet 2  . Omega-3 Fatty Acids (FISH OIL PO) Take by mouth daily.    Marland Kitchen OVER THE COUNTER MEDICATION OTC Iron Supplement     No current facility-administered medications on file prior to visit.      Objective:  Objective  Physical Exam  Musculoskeletal: He exhibits edema and tenderness.       Left knee: He exhibits swelling. Tenderness found. Medial joint line tenderness noted.  Nursing note and vitals reviewed.  BP 118/80  Wt Readings from Last 3 Encounters:  08/05/17 186 lb 6.4 oz (84.6 kg)  02/08/17 191 lb 9.6 oz (86.9 kg)  08/10/16 184 lb (83.5 kg)     Lab Results  Component Value Date   WBC 6.5 02/08/2017   HGB 14.0 02/08/2017   HCT 41.8 02/08/2017   PLT 229.0 02/08/2017   GLUCOSE 107 (H) 08/05/2017   CHOL 205 (H) 02/08/2017   TRIG 95.0 02/08/2017   HDL 48.90  02/08/2017   LDLCALC 137 (H) 02/08/2017   ALT 23 08/05/2017   AST 23 08/05/2017   NA 141 08/05/2017   K 4.0 08/05/2017   CL 104 08/05/2017   CREATININE 0.82 08/05/2017   BUN 19 08/05/2017   CO2 30 08/05/2017   TSH 1.84 02/08/2017   PSA 7.85 (H) 02/08/2017   HGBA1C 6.0 08/05/2017    Dg Knee Complete 4 Views Left  Result Date: 09/19/2017 CLINICAL DATA:  Injured left knee yesterday with pain medially EXAM: LEFT KNEE - COMPLETE 4+ VIEW COMPARISON:  None.  FINDINGS: Very mild degenerative changes present in the left knee primarily involving the medial compartment where there is slight loss of joint space and mild spurring. The lateral and patellofemoral compartments are relatively well preserved. No fracture is seen. No definite joint effusion is noted although a tiny amount of fluid may be present. IMPRESSION: 1. No acute fracture. Question tiny amount of fluid in the joint space. 2. Degenerative change primarily involving the medial compartment. Electronically Signed   By: Dwyane Dee M.D.   On: 09/19/2017 17:09     Assessment & Plan:  Plan  I am having [name] maintain his OVER THE COUNTER MEDICATION, Omega-3 Fatty Acids (FISH OIL PO), cyclobenzaprine, diazepam, and traMADol.  Meds ordered this encounter  Medications  . traMADol (ULTRAM) 50 MG tablet    Sig: Take 1 tablet (50 mg total) by mouth daily as needed.    Dispense:  30 tablet    Refill:  0    Problem List Items Addressed This Visit      Unprioritized   Knee pain, left - Primary   Relevant Medications   traMADol (ULTRAM) 50 MG tablet   Other Relevant Orders   DG Knee Complete 4 Views Left (Completed)    ice , elevation, rest and compression Refer to sport med if no better  Follow-up: Return if symptoms worsen or fail to improve.  Donato Schultz, DO

## 2017-09-19 NOTE — Patient Instructions (Signed)

## 2017-09-20 ENCOUNTER — Telehealth: Payer: Self-pay | Admitting: Family Medicine

## 2017-09-20 NOTE — Telephone Encounter (Signed)
Caller name: Remo Lipps  Relation to pt: from [facility] back number: [phone] c   Reason for call:   Would like to discuss date of service 09/19/17  claim # [id number] regarding workers comp , please advise (advised insurance we don't handle workers comp)

## 2017-11-18 DIAGNOSIS — R972 Elevated prostate specific antigen [PSA]: Secondary | ICD-10-CM | POA: Diagnosis not present

## 2017-11-25 ENCOUNTER — Other Ambulatory Visit: Payer: Self-pay | Admitting: Orthopaedic Surgery

## 2017-11-26 DIAGNOSIS — N401 Enlarged prostate with lower urinary tract symptoms: Secondary | ICD-10-CM | POA: Diagnosis not present

## 2017-11-26 DIAGNOSIS — N529 Male erectile dysfunction, unspecified: Secondary | ICD-10-CM | POA: Diagnosis not present

## 2017-11-26 DIAGNOSIS — N138 Other obstructive and reflux uropathy: Secondary | ICD-10-CM | POA: Diagnosis not present

## 2017-11-26 DIAGNOSIS — R972 Elevated prostate specific antigen [PSA]: Secondary | ICD-10-CM | POA: Diagnosis not present

## 2017-11-29 DIAGNOSIS — H0014 Chalazion left upper eyelid: Secondary | ICD-10-CM | POA: Diagnosis not present

## 2017-12-06 ENCOUNTER — Encounter (HOSPITAL_BASED_OUTPATIENT_CLINIC_OR_DEPARTMENT_OTHER): Payer: Self-pay | Admitting: *Deleted

## 2017-12-10 ENCOUNTER — Other Ambulatory Visit: Payer: Self-pay | Admitting: Orthopaedic Surgery

## 2017-12-11 NOTE — H&P (Signed)
Christopher Haynes is an 71 y.o. male.   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  He is here through [employer] about his left knee.  About 2 months ago he was taking out some trash and twisted and felt a terrible pain in his left knee.  He fell to the ground.  This occurred on 09/18/17.  He has been taking some ibuprofen to no avail.  He has also been working with some elevation and ice.  This wakes him from sleep and makes walking fairly difficult.  He has constant severe pain in the knee.  He does not feel it is getting better and if anything it is getting worse.    Imaging/Tests: I reviewed an MRI scan films and report of a study done [facility] on 10/27/17.  This does show a complex tear of the medial meniscus but also a subacute subchondral fracture of the medial tibial plateau which is minimally impacted.  Past Medical History:  Diagnosis Date  . Anxiety 08/05/2017  . Back pain 08/20/2016  . Benign prostatic hyperplasia (BPH) with urinary urgency 02/09/2016  . Constipation 02/20/2016  . Contusion 02/20/2016  . Elevated PSA   . H/O measles   . Headache   . History of colonic polyps 02/09/2016   [facility] roughly 2 years ago. Now on 5 year plan  . Hyperglycemia 08/20/2016  . Hyperlipidemia, mixed 02/08/2017  . Macular degeneration of left eye 02/19/2016  . Migraine with visual aura 02/09/2016  . Neck pain 08/05/2017  . OSA on CPAP   . Pain in joint, shoulder region 02/19/2016  . Peripheral neuropathy 08/05/2017  . Thyroid disease    thyroiditis, h/o in 5120s    Past Surgical History:  Procedure Laterality Date  . ANKLE FRACTURE SURGERY  66  . SKIN GRAFT  Age 71  . TONSILLECTOMY  Age 89    Family History  Problem Relation Age of Onset  . Hypertension Mother   . Diabetes Mother   . Colon cancer Father   . Cancer Father        rectal with mets  . Hypertension Brother   . Obesity  Son   . Other Son        fatty liver  . Allergic Disorder Son        seasonal  . Stroke Paternal Grandfather    Social History:  reports that  has never smoked. he has never used smokeless tobacco. He reports that he drinks alcohol. He reports that he does not use drugs.  Allergies:  Allergies  Allergen Reactions  . Aspirin Hives    Patient states that takes Aspirin regularly    No medications prior to admission.    No results found for this or any previous visit (from the past 48 hour(s)). No results found.  Review of Systems  Musculoskeletal: Positive for joint pain.       Left knee  All other systems reviewed and are negative.   Height 5\' 5"  (1.651 m), weight 76.2 kg (168 lb). Physical Exam  Constitutional: He is oriented to person, place, and time. He appears well-developed and well-nourished.  HENT:  Head: Normocephalic and atraumatic.  Eyes: Pupils are equal, round, and reactive to light.  Neck: Normal range of motion.  Cardiovascular: Normal rate and regular rhythm.  Respiratory: Effort normal.  GI: Soft.  Musculoskeletal:  Left knee  is terribly painful over the medial joint line and about the proximal tibia on the medial aspect.  His motion is nearly full.  He has trace effusion.  Ligaments feel stable.  Hip motion is full and straight leg raise is negative.  Sensation and motor function are intact in his feet with palpable pulses on both sides.    Neurological: He is alert and oriented to person, place, and time.  Skin: Skin is warm and dry.  Psychiatric: He has a normal mood and affect. His behavior is normal. Judgment and thought content normal.     Assessment/Plan Assessment: Left knee torn medial meniscus and subchondral fracture by MRI 2018  Plan: Christopher Haynes persists with some terrible pain about his left knee now 2 months from his injury.  Things are not getting better.  His MRI scan is 6 weeks from the injury and still shows significant edema about his  fracture site.  I think his best option is surgical.  I reviewed risk of anesthesia and infection related to an outpatient procedure where we will perform an arthroscopy to address his torn meniscus and also a sub-chondroplasty to address the fracture.    Bernice Mullin, Ginger OrganNDREW PAUL, PA-C 12/11/2017, 8:15 AM

## 2017-12-13 ENCOUNTER — Ambulatory Visit (HOSPITAL_BASED_OUTPATIENT_CLINIC_OR_DEPARTMENT_OTHER)
Admission: RE | Admit: 2017-12-13 | Discharge: 2017-12-13 | Disposition: A | Discharge status: Home / Self Care | Payer: Worker's Compensation | Source: Ambulatory Visit | Attending: Orthopaedic Surgery | Admitting: Orthopaedic Surgery

## 2017-12-13 ENCOUNTER — Encounter (HOSPITAL_BASED_OUTPATIENT_CLINIC_OR_DEPARTMENT_OTHER): Payer: Self-pay

## 2017-12-13 ENCOUNTER — Ambulatory Visit (HOSPITAL_BASED_OUTPATIENT_CLINIC_OR_DEPARTMENT_OTHER): Payer: Worker's Compensation | Admitting: Certified Registered"

## 2017-12-13 ENCOUNTER — Other Ambulatory Visit: Payer: Self-pay

## 2017-12-13 ENCOUNTER — Encounter (HOSPITAL_BASED_OUTPATIENT_CLINIC_OR_DEPARTMENT_OTHER)
Admission: RE | Disposition: A | Discharge status: Home / Self Care | Payer: Self-pay | Source: Ambulatory Visit | Attending: Orthopaedic Surgery

## 2017-12-13 DIAGNOSIS — E782 Mixed hyperlipidemia: Secondary | ICD-10-CM | POA: Diagnosis not present

## 2017-12-13 DIAGNOSIS — G629 Polyneuropathy, unspecified: Secondary | ICD-10-CM | POA: Insufficient documentation

## 2017-12-13 DIAGNOSIS — S82142A Displaced bicondylar fracture of left tibia, initial encounter for closed fracture: Secondary | ICD-10-CM | POA: Diagnosis not present

## 2017-12-13 DIAGNOSIS — M94262 Chondromalacia, left knee: Secondary | ICD-10-CM | POA: Diagnosis not present

## 2017-12-13 DIAGNOSIS — F419 Anxiety disorder, unspecified: Secondary | ICD-10-CM | POA: Insufficient documentation

## 2017-12-13 DIAGNOSIS — R3915 Urgency of urination: Secondary | ICD-10-CM | POA: Diagnosis not present

## 2017-12-13 DIAGNOSIS — G4733 Obstructive sleep apnea (adult) (pediatric): Secondary | ICD-10-CM | POA: Insufficient documentation

## 2017-12-13 DIAGNOSIS — Z79899 Other long term (current) drug therapy: Secondary | ICD-10-CM | POA: Insufficient documentation

## 2017-12-13 DIAGNOSIS — S83242A Other tear of medial meniscus, current injury, left knee, initial encounter: Secondary | ICD-10-CM | POA: Insufficient documentation

## 2017-12-13 DIAGNOSIS — N401 Enlarged prostate with lower urinary tract symptoms: Secondary | ICD-10-CM | POA: Insufficient documentation

## 2017-12-13 DIAGNOSIS — X501XXA Overexertion from prolonged static or awkward postures, initial encounter: Secondary | ICD-10-CM | POA: Diagnosis not present

## 2017-12-13 DIAGNOSIS — Y93E9 Activity, other interior property and clothing maintenance: Secondary | ICD-10-CM | POA: Diagnosis not present

## 2017-12-13 HISTORY — PX: KNEE ARTHROSCOPY WITH SUBCHONDROPLASTY: SHX6732

## 2017-12-13 SURGERY — ARTHROSCOPY, KNEE, WITH SUBCHONDROPLASTY
Anesthesia: General | Site: Knee | Laterality: Left

## 2017-12-13 MED ORDER — ONDANSETRON HCL 4 MG/2ML IJ SOLN
INTRAMUSCULAR | Status: AC
  Filled 2017-12-13: qty 2

## 2017-12-13 MED ORDER — LIDOCAINE 2% (20 MG/ML) 5 ML SYRINGE
INTRAMUSCULAR | Status: AC
  Filled 2017-12-13: qty 5

## 2017-12-13 MED ORDER — SCOPOLAMINE 1 MG/3DAYS TD PT72: 1.0000 | MEDICATED_PATCH | Freq: Once | TRANSDERMAL | Status: DC | PRN

## 2017-12-13 MED ORDER — BUPIVACAINE-EPINEPHRINE 0.5% -1:200000 IJ SOLN
INTRAMUSCULAR | Status: DC | PRN
  Administered 2017-12-13: 20 mL

## 2017-12-13 MED ORDER — CEFAZOLIN SODIUM-DEXTROSE 2-4 GM/100ML-% IV SOLN
INTRAVENOUS | Status: AC
  Filled 2017-12-13: qty 100

## 2017-12-13 MED ORDER — HYDROMORPHONE HCL 2 MG PO TABS: 2.0000 mg | ORAL_TABLET | ORAL | 0 refills | Status: DC | PRN

## 2017-12-13 MED ORDER — DEXAMETHASONE SODIUM PHOSPHATE 10 MG/ML IJ SOLN
INTRAMUSCULAR | Status: AC
  Filled 2017-12-13: qty 1

## 2017-12-13 MED ORDER — FENTANYL CITRATE (PF) 100 MCG/2ML IJ SOLN
50.0000 ug | INTRAMUSCULAR | Status: DC | PRN
  Administered 2017-12-13 (×2): 50 ug via INTRAVENOUS

## 2017-12-13 MED ORDER — MIDAZOLAM HCL 2 MG/2ML IJ SOLN
1.0000 mg | INTRAMUSCULAR | Status: DC | PRN
  Administered 2017-12-13: 2 mg via INTRAVENOUS

## 2017-12-13 MED ORDER — LACTATED RINGERS IV SOLN: INTRAVENOUS | Status: DC

## 2017-12-13 MED ORDER — CHLORHEXIDINE GLUCONATE 4 % EX LIQD: 60.0000 mL | Freq: Once | CUTANEOUS | Status: DC

## 2017-12-13 MED ORDER — LIDOCAINE 2% (20 MG/ML) 5 ML SYRINGE
INTRAMUSCULAR | Status: DC | PRN
Start: 2017-12-13 — End: 2017-12-13
  Administered 2017-12-13: 40 mg via INTRAVENOUS

## 2017-12-13 MED ORDER — FENTANYL CITRATE (PF) 100 MCG/2ML IJ SOLN
INTRAMUSCULAR | Status: AC
  Filled 2017-12-13: qty 2

## 2017-12-13 MED ORDER — PROPOFOL 10 MG/ML IV BOLUS
INTRAVENOUS | Status: DC | PRN
  Administered 2017-12-13: 180 mg via INTRAVENOUS

## 2017-12-13 MED ORDER — CEFAZOLIN SODIUM-DEXTROSE 2-4 GM/100ML-% IV SOLN
2.0000 g | INTRAVENOUS | Status: AC
Start: 2017-12-13 — End: 2017-12-13
  Administered 2017-12-13: 2 g via INTRAVENOUS

## 2017-12-13 MED ORDER — MIDAZOLAM HCL 2 MG/2ML IJ SOLN
INTRAMUSCULAR | Status: AC
  Filled 2017-12-13: qty 2

## 2017-12-13 MED ORDER — DEXAMETHASONE SODIUM PHOSPHATE 4 MG/ML IJ SOLN
INTRAMUSCULAR | Status: DC | PRN
  Administered 2017-12-13: 10 mg via INTRAVENOUS

## 2017-12-13 MED ORDER — PROPOFOL 10 MG/ML IV BOLUS
INTRAVENOUS | Status: AC
  Filled 2017-12-13: qty 40

## 2017-12-13 MED ORDER — FENTANYL CITRATE (PF) 100 MCG/2ML IJ SOLN
25.0000 ug | INTRAMUSCULAR | Status: DC | PRN
  Administered 2017-12-13 (×2): 50 ug via INTRAVENOUS

## 2017-12-13 MED ORDER — HYDROMORPHONE HCL 2 MG PO TABS
ORAL_TABLET | ORAL | Status: AC
  Filled 2017-12-13: qty 1

## 2017-12-13 MED ORDER — SODIUM CHLORIDE 0.9 % IR SOLN
Status: DC | PRN
  Administered 2017-12-13: 1

## 2017-12-13 MED ORDER — HYDROMORPHONE HCL 2 MG PO TABS
2.0000 mg | ORAL_TABLET | Freq: Once | ORAL | Status: AC
  Administered 2017-12-13: 2 mg via ORAL

## 2017-12-13 MED ORDER — LACTATED RINGERS IV SOLN
INTRAVENOUS | Status: DC
  Administered 2017-12-13: 12:00:00 via INTRAVENOUS

## 2017-12-13 MED FILL — HYDROmorphone HCL 2 MG TABS: 2 | 4 days supply | Qty: 40 | Fill #0

## 2017-12-13 SURGICAL SUPPLY — 39 items
BANDAGE ACE 6X5 VEL STRL LF (GAUZE/BANDAGES/DRESSINGS) ×2 IMPLANT
BLADE CUDA 5.5 (BLADE) IMPLANT
BLADE CUTTER GATOR 3.5 (BLADE) ×2 IMPLANT
BLADE GREAT WHITE 4.2 (BLADE) IMPLANT
BNDG GAUZE ELAST 4 BULKY (GAUZE/BANDAGES/DRESSINGS) ×2 IMPLANT
DRAPE ARTHROSCOPY W/POUCH 90 (DRAPES) ×2 IMPLANT
DRAPE OEC MINIVIEW 54X84 (DRAPES) ×1 IMPLANT
DRAPE U-SHAPE 47X51 STRL (DRAPES) ×2 IMPLANT
DRSG EMULSION OIL 3X3 NADH (GAUZE/BANDAGES/DRESSINGS) ×2 IMPLANT
DURAPREP 26ML APPLICATOR (WOUND CARE) ×4 IMPLANT
ELECT MENISCUS 165MM 90D (ELECTRODE) ×1 IMPLANT
ELECT REM PT RETURN 9FT ADLT (ELECTROSURGICAL) ×2
ELECTRODE REM PT RTRN 9FT ADLT (ELECTROSURGICAL) ×1 IMPLANT
GAUZE SPONGE 4X4 12PLY STRL (GAUZE/BANDAGES/DRESSINGS) ×2 IMPLANT
GLOVE BIO SURGEON STRL SZ 6.5 (GLOVE) ×1 IMPLANT
GLOVE BIO SURGEON STRL SZ8 (GLOVE) ×4 IMPLANT
GLOVE BIOGEL PI IND STRL 7.0 (GLOVE) IMPLANT
GLOVE BIOGEL PI IND STRL 8 (GLOVE) ×2 IMPLANT
GLOVE BIOGEL PI INDICATOR 7.0 (GLOVE) ×1
GLOVE BIOGEL PI INDICATOR 8 (GLOVE) ×2
GOWN STRL REUS W/ TWL LRG LVL3 (GOWN DISPOSABLE) ×1 IMPLANT
GOWN STRL REUS W/ TWL XL LVL3 (GOWN DISPOSABLE) ×2 IMPLANT
GOWN STRL REUS W/TWL LRG LVL3 (GOWN DISPOSABLE) ×2
GOWN STRL REUS W/TWL XL LVL3 (GOWN DISPOSABLE) ×4
IV NS IRRIG 3000ML ARTHROMATIC (IV SOLUTION) ×2 IMPLANT
KIT MIXER ACCUMIX (KITS) ×2 IMPLANT
KNEE KIT SCP W/SIDE ACCUPORT (Joint) ×1 IMPLANT
KNEE WRAP E Z 3 GEL PACK (MISCELLANEOUS) ×2 IMPLANT
MANIFOLD NEPTUNE II (INSTRUMENTS) ×1 IMPLANT
PACK ARTHROSCOPY DSU (CUSTOM PROCEDURE TRAY) ×2 IMPLANT
PACK BASIN DAY SURGERY FS (CUSTOM PROCEDURE TRAY) ×2 IMPLANT
PENCIL BUTTON HOLSTER BLD 10FT (ELECTRODE) ×1 IMPLANT
SET ARTHROSCOPY TUBING (MISCELLANEOUS) ×2
SET ARTHROSCOPY TUBING LN (MISCELLANEOUS) ×1 IMPLANT
SHEET MEDIUM DRAPE 40X70 STRL (DRAPES) ×3 IMPLANT
SYR 3ML 18GX1 1/2 (SYRINGE) IMPLANT
TOWEL OR 17X24 6PK STRL BLUE (TOWEL DISPOSABLE) ×2 IMPLANT
TOWEL OR NON WOVEN STRL DISP B (DISPOSABLE) ×2 IMPLANT
WATER STERILE IRR 1000ML POUR (IV SOLUTION) ×2 IMPLANT

## 2017-12-13 NOTE — Op Note (Signed)
#  [id number] 

## 2017-12-13 NOTE — Discharge Instructions (Signed)

## 2017-12-13 NOTE — Anesthesia Preprocedure Evaluation (Addendum)
Anesthesia Evaluation  Patient identified by MRN, date of birth, ID band Patient awake    Reviewed: Allergy & Precautions, NPO status , Patient's Chart, lab work & pertinent test results  Airway Mallampati: II  TM Distance: >3 FB     Dental   Pulmonary sleep apnea ,    breath sounds clear to auscultation       Cardiovascular negative cardio ROS   Rhythm:Regular Rate:Normal     Neuro/Psych    GI/Hepatic negative GI ROS, Neg liver ROS,   Endo/Other  negative endocrine ROS  Renal/GU negative Renal ROS     Musculoskeletal   Abdominal   Peds  Hematology   Anesthesia Other Findings   Reproductive/Obstetrics                             Anesthesia Physical Anesthesia Plan  ASA: III  Anesthesia Plan: General   Post-op Pain Management:    Induction: Intravenous  PONV Risk Score and Plan: 2 and Treatment may vary due to age or medical condition, Ondansetron, Dexamethasone and Midazolam  Airway Management Planned:   Additional Equipment:   Intra-op Plan:   Post-operative Plan: Extubation in OR  Informed Consent: I have reviewed the patients History and Physical, chart, labs and discussed the procedure including the risks, benefits and alternatives for the proposed anesthesia with the patient or authorized representative who has indicated his/her understanding and acceptance.   Dental advisory given  Plan Discussed with: CRNA and Anesthesiologist  Anesthesia Plan Comments:         Anesthesia Quick Evaluation

## 2017-12-13 NOTE — Transfer of Care (Signed)
Immediate Anesthesia Transfer of Care Note  Patient: Christopher Haynes  Procedure(s) Performed: KNEE ARTHROSCOPY WITH SUBCHONDROPLASTY (Left Knee)  Patient Location: PACU  Anesthesia Type:General  Level of Consciousness: awake and sedated  Airway & Oxygen Therapy: Patient Spontanous Breathing and Patient connected to face mask oxygen  Post-op Assessment: Report given to RN and Post -op Vital signs reviewed and stable  Post vital signs: Reviewed and stable  Last Vitals:  Vitals:   12/13/17 1116  BP: 133/76  Pulse: (!) 58  Resp: 18  Temp: 36.6 C  SpO2: 100%    Last Pain:  Vitals:   12/13/17 1116  TempSrc: Oral         Complications: No apparent anesthesia complications

## 2017-12-13 NOTE — Anesthesia Procedure Notes (Signed)
Procedure Name: LMA Insertion Performed by: Hannan Hutmacher W, CRNA Pre-anesthesia Checklist: Patient identified, Emergency Drugs available, Suction available and Patient being monitored Patient Re-evaluated:Patient Re-evaluated prior to induction Oxygen Delivery Method: Circle system utilized Preoxygenation: Pre-oxygenation with 100% oxygen Induction Type: IV induction Ventilation: Mask ventilation without difficulty LMA: LMA inserted LMA Size: 5.0 Number of attempts: 1 Placement Confirmation: positive ETCO2 Tube secured with: Tape Dental Injury: Teeth and Oropharynx as per pre-operative assessment        

## 2017-12-13 NOTE — Op Note (Signed)
NAMLurline Idol:  Mayorquin, Mahin                ACCOUNT NO.:  [id number]  MEDICAL RECORD NO.:  [id number]  LOCATION:                                 FACILITY:  PHYSICIAN:  Lubertha Basqueeter G. Jerl Santosalldorf, M.D.     DATE OF BIRTH:  DATE OF PROCEDURE:  12/13/2017 DATE OF DISCHARGE:                              OPERATIVE REPORT   PREOPERATIVE DIAGNOSES: 1. Left knee torn medial meniscus. 2. Left knee chondromalacia. 3. Left knee subchondral fracture, medial tibial plateau.  POSTOPERATIVE DIAGNOSES: 1. Left knee torn medial meniscus. 2. Left knee chondromalacia. 3. Left knee subchondral fracture, medial tibial plateau.  PROCEDURES: 1. Left knee partial medial meniscectomy. 2. Left knee abrasion chondroplasty. 3. Left knee percutaneous arthroscopic assisted fixation of medial     tibial plateau fracture.  ANESTHESIA:  General.  SURGEON:  Lubertha BasquePeter G. Jerl Santosalldorf, M.D.  ASSISTANT:  Elodia FlorenceAndrew Nida, PA.  INDICATION FOR PROCEDURE:  The patient is a 71 year old man who works as a Mudloggerbuilding manager.  About 3 months back, he was taking out trash and twisted and had terrible pain in his left knee.  He fell to the ground. He has been persisting with difficulty despite ibuprofen and rest.  He underwent an MRI scan, which showed a medial meniscus tear but also a subchondral fracture of the tibial plateau.  A good deal of his pain was below the joint line though he did have a trace effusion and a McMurray test, which was positive for pain.  He was offered a knee arthroscopy along with a sub-chondroplasty to address his tibial plateau fracture. Informed operative consent was obtained after discussion of possible complications including reaction to anesthesia and infection.  SUMMARY OF FINDINGS AND PROCEDURE:  Under general anesthesia, the patient first underwent a left knee arthroscopy.  The suprapatellar pouch was benign while the patellofemoral joint exhibited some focal breakdown on the undersurface of the patella.  This  was addressed with chondroplasty and abrasion to bleeding bone on some small areas.  In the medial compartment, he had a displaced tear of the medial meniscus, addressed with about 10% partial medial meniscectomy.  He had really minimal degenerative change in this compartment.  ACL looked normal and the lateral compartment was benign.  At this point, we performed the percutaneous fixation of his medial tibial plateau fracture.  We performed a sub-chondroplasty with the Biomet system placing calcium phosphate into the defect.  We used fluoroscopy throughout the case to make appropriate intraoperative decisions and read all of these views myself.  DESCRIPTION OF PROCEDURE:  The patient was taken to the operating suite where general anesthetic was applied without difficulty.  He was positioned supine and prepped and draped in normal sterile fashion. After administration of IV Kefzol and an appropriate time out, an arthroscopy of the left knee was performed through total of 2 portals. Findings were as noted above.  Procedure consisted predominantly of the partial medial meniscectomy, which was done with basket and shaver taking this back to a stable rim.  We then performed the chondroplasty and abrasion as described above.  At this point, we performed the percutaneous arthroscopic assisted fixation of the medial tibial plateau fracture.  We  used fluoroscopy throughout this portion of the case.  I made a small incision over the medial tibial plateau and advanced the guide pin through the fracture site.  This was found to be in appropriate position on AP and lateral views and centered within the fracture.  I then implanted 5 mL of calcium phosphate implant and allowed about 10 minutes for this to fully harden.  I then placed the scope in the knee once again and found no extravasation of the material into the joint.  We irrigated followed by placement of Adaptic over the portals.  Dry gauze  was applied along with a loose Ace wrap.  Estimated blood loss and intraoperative fluids can be obtained from anesthesia records.  DISPOSITION:  The patient was extubated in the operating room and taken to recovery room in stable addition.  He was to go home same-day and follow up in the office closely.  I will contact him by phone tonight.     Lubertha BasquePeter G. Jerl Santosalldorf, M.D.     PGD/MEDQ  D:  12/13/2017  T:  12/13/2017  Job:  [id number]

## 2017-12-13 NOTE — Interval H&P Note (Signed)
History and Physical Interval Note:  12/13/2017 12:56 PM  Christopher Haynes  has presented today for surgery, with the diagnosis of LEFT KNEE MEDIAL MENISCAL TEAR AND SUBCHONDRAL FRACTURE  The various methods of treatment have been discussed with the patient and family. After consideration of risks, benefits and other options for treatment, the patient has consented to  Procedure(s): KNEE ARTHROSCOPY WITH SUBCHONDROPLASTY (Left) as a surgical intervention .  The patient's history has been reviewed, patient examined, no change in status, stable for surgery.  I have reviewed the patient's chart and labs.  Questions were answered to the patient's satisfaction.     Skylynne Schlechter G

## 2017-12-18 ENCOUNTER — Encounter (HOSPITAL_BASED_OUTPATIENT_CLINIC_OR_DEPARTMENT_OTHER): Payer: Self-pay | Admitting: Orthopaedic Surgery

## 2017-12-23 NOTE — Anesthesia Postprocedure Evaluation (Signed)
Anesthesia Post Note  Patient: Christopher Haynes  Procedure(s) Performed: KNEE ARTHROSCOPY WITH SUBCHONDROPLASTY (Left Knee)     Patient location during evaluation: PACU Anesthesia Type: General Level of consciousness: awake Pain management: pain level controlled Respiratory status: spontaneous breathing Cardiovascular status: stable Anesthetic complications: no    Last Vitals:  Vitals:   12/13/17 1515 12/13/17 1550  BP: (!) 145/87 (!) 151/75  Pulse: 71 77  Resp: 14 16  Temp:  36.6 C  SpO2: 100% 100%    Last Pain:  Vitals:   12/18/17 0949  TempSrc:   PainSc: 0-No pain                 Lakeshia Dohner

## 2018-02-13 ENCOUNTER — Ambulatory Visit (INDEPENDENT_AMBULATORY_CARE_PROVIDER_SITE_OTHER): Payer: Medicare Other | Admitting: Family Medicine

## 2018-02-13 ENCOUNTER — Encounter: Payer: Self-pay | Admitting: Family Medicine

## 2018-02-13 VITALS — BP 120/72 | HR 51 | Temp 97.6°F | Resp 18 | Ht 65.0 in | Wt 174.8 lb

## 2018-02-13 DIAGNOSIS — E782 Mixed hyperlipidemia: Secondary | ICD-10-CM | POA: Diagnosis not present

## 2018-02-13 DIAGNOSIS — Z0000 Encounter for general adult medical examination without abnormal findings: Secondary | ICD-10-CM

## 2018-02-13 DIAGNOSIS — M25562 Pain in left knee: Secondary | ICD-10-CM | POA: Diagnosis not present

## 2018-02-13 DIAGNOSIS — Z79899 Other long term (current) drug therapy: Secondary | ICD-10-CM | POA: Diagnosis not present

## 2018-02-13 DIAGNOSIS — R972 Elevated prostate specific antigen [PSA]: Secondary | ICD-10-CM

## 2018-02-13 DIAGNOSIS — F419 Anxiety disorder, unspecified: Secondary | ICD-10-CM

## 2018-02-13 DIAGNOSIS — E559 Vitamin D deficiency, unspecified: Secondary | ICD-10-CM | POA: Diagnosis not present

## 2018-02-13 DIAGNOSIS — R739 Hyperglycemia, unspecified: Secondary | ICD-10-CM | POA: Diagnosis not present

## 2018-02-13 DIAGNOSIS — G6289 Other specified polyneuropathies: Secondary | ICD-10-CM | POA: Diagnosis not present

## 2018-02-13 LAB — CBC
HEMATOCRIT: 43.2 % (ref 39.0–52.0)
Hemoglobin: 14.4 g/dL (ref 13.0–17.0)
MCHC: 33.4 g/dL (ref 30.0–36.0)
MCV: 90.4 fl (ref 78.0–100.0)
PLATELETS: 249 10*3/uL (ref 150.0–400.0)
RBC: 4.78 Mil/uL (ref 4.22–5.81)
RDW: 13.5 % (ref 11.5–15.5)
WBC: 4.2 10*3/uL (ref 4.0–10.5)

## 2018-02-13 LAB — NO SPECIMEN RECEIVED: Tests (Ordered): 92489

## 2018-02-13 LAB — TSH: TSH: 2.25 u[IU]/mL (ref 0.35–4.50)

## 2018-02-13 LAB — COMPREHENSIVE METABOLIC PANEL
ALBUMIN: 4 g/dL (ref 3.5–5.2)
ALK PHOS: 94 U/L (ref 39–117)
ALT: 14 U/L (ref 0–53)
AST: 15 U/L (ref 0–37)
BUN: 15 mg/dL (ref 6–23)
CALCIUM: 9.7 mg/dL (ref 8.4–10.5)
CHLORIDE: 106 meq/L (ref 96–112)
CO2: 31 mEq/L (ref 19–32)
Creatinine, Ser: 0.89 mg/dL (ref 0.40–1.50)
GFR: 89.36 mL/min (ref >60.00)
Glucose, Bld: 98 mg/dL (ref 70–99)
POTASSIUM: 4.8 meq/L (ref 3.5–5.1)
SODIUM: 142 meq/L (ref 135–145)
TOTAL PROTEIN: 6.9 g/dL (ref 6.0–8.3)
Total Bilirubin: 0.6 mg/dL (ref 0.2–1.2)

## 2018-02-13 LAB — LIPID PANEL
CHOLESTEROL: 199 mg/dL (ref 0–200)
HDL: 64.2 mg/dL (ref >39.00)
LDL CALC: 112 mg/dL — AB (ref 0–99)
NonHDL: 134.42
TRIGLYCERIDES: 113 mg/dL (ref 0.0–149.0)
Total CHOL/HDL Ratio: 3
VLDL: 22.6 mg/dL (ref 0.0–40.0)

## 2018-02-13 LAB — HEMOGLOBIN A1C: Hgb A1c MFr Bld: 5.8 % (ref 4.6–6.5)

## 2018-02-13 LAB — FOLATE: FOLATE: 12.8 ng/mL (ref >5.9)

## 2018-02-13 LAB — VITAMIN D 25 HYDROXY (VIT D DEFICIENCY, FRACTURES): VITD: 34.49 ng/mL (ref 30.00–100.00)

## 2018-02-13 MED ORDER — DIAZEPAM 10 MG PO TABS: 10.0000 mg | ORAL_TABLET | Freq: Every day | ORAL | 2 refills | Status: DC | PRN

## 2018-02-13 MED FILL — diazePAM 10 MG TABS: 10 | 30 days supply | Qty: 30 | Fill #0

## 2018-02-13 NOTE — Assessment & Plan Note (Signed)
Following Dr Pete GlatterStoneKing and sees them every 6 months. Has had multiple biopsies in past

## 2018-02-13 NOTE — Assessment & Plan Note (Signed)
New symptoms in right foot recently. Will check some labs to confirm no deficiency

## 2018-02-13 NOTE — Assessment & Plan Note (Signed)
Is following with Dr Margreta Journeyahldorf after a work related injury

## 2018-02-13 NOTE — Patient Instructions (Signed)
Preventive Care 65 Years and Older, Male Preventive care refers to lifestyle choices and visits with your health care provider that can promote health and wellness. What does preventive care include?  A yearly physical exam. This is also called an annual well check.  Dental exams once or twice a year.  Routine eye exams. Ask your health care provider how often you should have your eyes checked.  Personal lifestyle choices, including: ? Daily care of your teeth and gums. ? Regular physical activity. ? Eating a healthy diet. ? Avoiding tobacco and drug use. ? Limiting alcohol use. ? Practicing safe sex. ? Taking low doses of aspirin every day. ? Taking vitamin and mineral supplements as recommended by your health care provider. What happens during an annual well check? The services and screenings done by your health care provider during your annual well check will depend on your age, overall health, lifestyle risk factors, and family history of disease. Counseling Your health care provider may ask you questions about your:  Alcohol use.  Tobacco use.  Drug use.  Emotional well-being.  Home and relationship well-being.  Sexual activity.  Eating habits.  History of falls.  Memory and ability to understand (cognition).  Work and work environment.  Screening You may have the following tests or measurements:  Height, weight, and BMI.  Blood pressure.  Lipid and cholesterol levels. These may be checked every 5 years, or more frequently if you are over 50 years old.  Skin check.  Lung cancer screening. You may have this screening every year starting at age 55 if you have a 30-pack-year history of smoking and currently smoke or have quit within the past 15 years.  Fecal occult blood test (FOBT) of the stool. You may have this test every year starting at age 50.  Flexible sigmoidoscopy or colonoscopy. You may have a sigmoidoscopy every 5 years or a colonoscopy every 10  years starting at age 50.  Prostate cancer screening. Recommendations will vary depending on your family history and other risks.  Hepatitis C blood test.  Hepatitis B blood test.  Sexually transmitted disease (STD) testing.  Diabetes screening. This is done by checking your blood sugar (glucose) after you have not eaten for a while (fasting). You may have this done every 1-3 years.  Abdominal aortic aneurysm (AAA) screening. You may need this if you are a current or former smoker.  Osteoporosis. You may be screened starting at age 70 if you are at high risk.  Talk with your health care provider about your test results, treatment options, and if necessary, the need for more tests. Vaccines Your health care provider may recommend certain vaccines, such as:  Influenza vaccine. This is recommended every year.  Tetanus, diphtheria, and acellular pertussis (Tdap, Td) vaccine. You may need a Td booster every 10 years.  Varicella vaccine. You may need this if you have not been vaccinated.  Zoster vaccine. You may need this after age 60.  Measles, mumps, and rubella (MMR) vaccine. You may need at least one dose of MMR if you were born in 1957 or later. You may also need a second dose.  Pneumococcal 13-valent conjugate (PCV13) vaccine. One dose is recommended after age 72.  Pneumococcal polysaccharide (PPSV23) vaccine. One dose is recommended after age 72.  Meningococcal vaccine. You may need this if you have certain conditions.  Hepatitis A vaccine. You may need this if you have certain conditions or if you travel or work in places where you   may be exposed to hepatitis A.  Hepatitis B vaccine. You may need this if you have certain conditions or if you travel or work in places where you may be exposed to hepatitis B.  Haemophilus influenzae type b (Hib) vaccine. You may need this if you have certain risk factors.  Talk to your health care provider about which screenings and vaccines  you need and how often you need them. This information is not intended to replace advice given to you by your health care provider. Make sure you discuss any questions you have with your health care provider. Document Released: 01/06/2016 Document Revised: 08/29/2016 Document Reviewed: 10/11/2015 Elsevier Interactive Patient Education  2018 [employer]  

## 2018-02-13 NOTE — Assessment & Plan Note (Signed)
minimize simple carbs. Increase exercise as tolerated.  

## 2018-02-13 NOTE — Progress Notes (Signed)
Subjective:  I acted as a Neurosurgeonscribe for Dr. Abner GreenspanBlyth. Princess, [place]MA  Patient ID: Christopher IdolFidel Haynes, male    DOB: Feb 11, 1946, 72 y.o.   MRN: [id number]  No chief complaint on file.   HPI  Patient is in today for an annual exam and follow up on chronic medical concerns including anxiety, peripheral neuropathy, hyperglycemia and more. He feels well today. He suffered a fall at work and had a left knee injury including to his medial meniscus. He also cracked his patella and it had to be treated by Dr Margreta Journeyahldorf. He has been off work as a result but is improving. He is doing well with his activities of daily living and tries to stay active and maintain a heart healthy diet. Denies CP/palp/SOB/HA/congestion/fevers/GI or GU c/o. Taking meds as prescribed  Patient Care Team: Bradd CanaryBlyth, Stacey A, MD as PCP - General (Family Medicine) Pete GlatterStoneking, Danford BadBradley J., MD as Referring Physician (Urology) Mckinley JewelStoneburner, Sara, MD as Consulting Physician (Ophthalmology) Floyde Parkinsyer, Christopher, MD (Unknown Physician Specialty)   Past Medical History:  Diagnosis Date  . Anxiety 08/05/2017  . Back pain 08/20/2016  . Benign prostatic hyperplasia (BPH) with urinary urgency 02/09/2016  . Constipation 02/20/2016  . Contusion 02/20/2016  . Elevated PSA   . H/O measles   . Headache   . History of colonic polyps 02/09/2016   [facility] roughly [facility] years ago. Now on 5 year plan  . Hyperglycemia 08/20/2016  . Hyperlipidemia, mixed 02/08/2017  . Macular degeneration of left eye 02/19/2016  . Migraine with visual aura 02/09/2016  . Neck pain 08/05/2017  . OSA on CPAP   . Pain in joint, shoulder region 02/19/2016  . Peripheral neuropathy 08/05/2017  . Thyroid disease    thyroiditis, h/o in 5920s    Past Surgical History:  Procedure Laterality Date  . ANKLE FRACTURE SURGERY  66  . KNEE ARTHROSCOPY WITH SUBCHONDROPLASTY Left 12/13/2017   Procedure: KNEE ARTHROSCOPY WITH SUBCHONDROPLASTY;  Surgeon: Marcene Corningalldorf, Peter, MD;   Location: [facility];  Service: Orthopedics;  Laterality: Left;  . SKIN GRAFT  Age 72  . TONSILLECTOMY  Age 89    Family History  Problem Relation Age of Onset  . Hypertension Mother   . Diabetes Mother   . Colon cancer Father   . Cancer Father        rectal with mets  . Hypertension Brother   . Obesity Son   . Other Son        fatty liver  . Allergic Disorder Son        seasonal  . Stroke Paternal Grandfather     Social History   Socioeconomic History  . Marital status: Married    Spouse name: Not on file  . Number of children: Not on file  . Years of education: Not on file  . Highest education level: Not on file  Social Needs  . Financial resource strain: Not on file  . Food insecurity - worry: Not on file  . Food insecurity - inability: Not on file  . Transportation needs - medical: Not on file  . Transportation needs - non-medical: Not on file  Occupational History  . Occupation: Customer service managereal Estate Agent  Tobacco Use  . Smoking status: Never Smoker  . Smokeless tobacco: Never Used  Substance and Sexual Activity  . Alcohol use: Yes    Alcohol/week: 0.0 oz  . Drug use: No  . Sexual activity: Yes    Comment: lives with  wife, work in Audiological scientist estate, no dietary restrictions  Other Topics Concern  . Not on file  Social History Narrative  . Not on file    Outpatient Medications Prior to Visit  Medication Sig Dispense Refill  . Omega-3 Fatty Acids (FISH OIL PO) Take by mouth daily.    . tamsulosin (FLOMAX) 0.4 MG CAPS capsule Take 0.4 mg by mouth.    . traMADol (ULTRAM) 50 MG tablet Take 1 tablet (50 mg total) by mouth daily as needed. 30 tablet 0  . diazepam (VALIUM) 10 MG tablet Take 1 tablet (10 mg total) by mouth daily as needed. 30 tablet 2  . HYDROmorphone (DILAUDID) 2 MG tablet Take 1-2 tablets (2-4 mg total) by mouth every 4 (four) hours as needed for severe pain. 40 tablet 0  . diclofenac sodium (VOLTAREN) 1 % GEL Apply 2 g topically 4 (four) times  daily.     No facility-administered medications prior to visit.     Allergies  Allergen Reactions  . Aspirin Hives    Patient states that takes Aspirin regularly without issues. Per primary at [facility] recommends no asa with hives because can exacerbate hives.    Review of Systems  Constitutional: Negative for chills, fever and malaise/fatigue.  HENT: Negative for congestion and hearing loss.   Eyes: Negative for discharge.  Respiratory: Negative for cough, sputum production and shortness of breath.   Cardiovascular: Negative for chest pain, palpitations and leg swelling.  Gastrointestinal: Negative for abdominal pain, blood in stool, constipation, diarrhea, heartburn, nausea and vomiting.  Genitourinary: Negative for dysuria, frequency, hematuria and urgency.  Musculoskeletal: Positive for joint pain. Negative for back pain, falls and myalgias.  Skin: Negative for rash.  Neurological: Negative for dizziness, sensory change, loss of consciousness, weakness and headaches.  Endo/Heme/Allergies: Negative for environmental allergies. Does not bruise/bleed easily.  Psychiatric/Behavioral: Negative for depression and suicidal ideas. The patient is not nervous/anxious and does not have insomnia.        Objective:    Physical Exam  Constitutional: He is oriented to person, place, and time. He appears well-developed and well-nourished. No distress.  HENT:  Head: Normocephalic and atraumatic.  Eyes: Conjunctivae are normal.  Neck: Neck supple. No thyromegaly present.  Cardiovascular: Normal rate, regular rhythm and normal heart sounds.  No murmur heard. Pulmonary/Chest: Effort normal and breath sounds normal. No respiratory distress. He has no wheezes.  Abdominal: Soft. Bowel sounds are normal. He exhibits no mass. There is no tenderness.  Musculoskeletal: He exhibits no edema.  Lymphadenopathy:    He has no cervical adenopathy.  Neurological: He is alert and oriented to person, place, and  time.  Skin: Skin is warm and dry.  Psychiatric: He has a normal mood and affect. His behavior is normal.    BP 120/72 (BP Location: Left Arm, Patient Position: Sitting, Cuff Size: Normal)   Pulse (!) 51   Temp 97.6 F (36.4 C) (Oral)   Resp 18   Ht 5\' 5"  (1.651 m)   Wt 174 lb 12.8 oz (79.3 kg)   SpO2 98%   BMI 29.09 kg/m  Wt Readings from Last 3 Encounters:  02/13/18 174 lb 12.8 oz (79.3 kg)  12/13/17 171 lb 2 oz (77.6 kg)  08/05/17 186 lb 6.4 oz (84.6 kg)   BP Readings from Last 3 Encounters:  02/13/18 120/72  12/13/17 (!) 151/75  09/19/17 118/80     Immunization History  Administered Date(s) Administered  . Influenza-Unspecified 09/24/2015  . Pneumococcal-Unspecified 12/24/2009  .  Tdap 12/25/2011    Health Maintenance  Topic Date Due  . PNA vac Low Risk Adult (1 of 2 - PCV13) 05/09/2011  . INFLUENZA VACCINE  07/24/2017  . COLONOSCOPY  12/24/2018  . TETANUS/TDAP  12/24/2021  . Hepatitis C Screening  Completed    Lab Results  Component Value Date   WBC 4.2 02/13/2018   HGB 14.4 02/13/2018   HCT 43.2 02/13/2018   PLT 249.0 02/13/2018   GLUCOSE 98 02/13/2018   CHOL 199 02/13/2018   TRIG 113.0 02/13/2018   HDL 64.20 02/13/2018   LDLCALC 112 (H) 02/13/2018   ALT 14 02/13/2018   AST 15 02/13/2018   NA 142 02/13/2018   K 4.8 02/13/2018   CL 106 02/13/2018   CREATININE 0.89 02/13/2018   BUN 15 02/13/2018   CO2 31 02/13/2018   TSH 2.25 02/13/2018   PSA 7.85 (H) 02/08/2017   HGBA1C 5.8 02/13/2018    Lab Results  Component Value Date   TSH 2.25 02/13/2018   Lab Results  Component Value Date   WBC 4.2 02/13/2018   HGB 14.4 02/13/2018   HCT 43.2 02/13/2018   MCV 90.4 02/13/2018   PLT 249.0 02/13/2018   Lab Results  Component Value Date   NA 142 02/13/2018   K 4.8 02/13/2018   CO2 31 02/13/2018   GLUCOSE 98 02/13/2018   BUN 15 02/13/2018   CREATININE 0.89 02/13/2018   BILITOT 0.6 02/13/2018   ALKPHOS 94 02/13/2018   AST 15 02/13/2018   ALT  14 02/13/2018   PROT 6.9 02/13/2018   ALBUMIN 4.0 02/13/2018   CALCIUM 9.7 02/13/2018   GFR 89.36 02/13/2018   Lab Results  Component Value Date   CHOL 199 02/13/2018   Lab Results  Component Value Date   HDL 64.20 02/13/2018   Lab Results  Component Value Date   LDLCALC 112 (H) 02/13/2018   Lab Results  Component Value Date   TRIG 113.0 02/13/2018   Lab Results  Component Value Date   CHOLHDL 3 02/13/2018   Lab Results  Component Value Date   HGBA1C 5.8 02/13/2018         Assessment & Plan:   Problem List Items Addressed This Visit    Elevated PSA    Following Dr Pete Glatter and sees them every 6 months. Has had multiple biopsies in past      Hyperglycemia     minimize simple carbs. Increase exercise as tolerated.       Relevant Orders   Folate (Completed)   Vitamin B1   Hemoglobin A1c (Completed)   Comprehensive metabolic panel (Completed)   TSH (Completed)   Hyperlipidemia, mixed    Encouraged heart healthy diet, increase exercise, avoid trans fats, consider a krill oil cap daily      Relevant Orders   Folate (Completed)   Vitamin B1   Lipid panel (Completed)   TSH (Completed)   Medicare annual wellness visit, subsequent    Patient denies any difficulties at home. No trouble with ADLs, depression or falls. See EMR for functional status screen and depression screen. No recent changes to vision or hearing. Is UTD with immunizations. Is UTD with screening. Discussed Advanced Directives. Encouraged heart healthy diet, exercise as tolerated and adequate sleep. See patient's problem list for health risk factors to monitor. See AVS for preventative healthcare recommendation schedule.      Anxiety    Using Diazepam prn with good results, uds and contract utd and refill allowed  Relevant Medications   diazepam (VALIUM) 10 MG tablet   Knee pain, left    Is following with Dr Margreta Journey after a work related injury      Relevant Orders   Folate  (Completed)   Vitamin B1   CBC (Completed)   Peripheral neuropathy    New symptoms in right foot recently. Will check some labs to confirm no deficiency      Relevant Medications   diazepam (VALIUM) 10 MG tablet   Other Relevant Orders   Folate (Completed)   Vitamin B1   CBC (Completed)   Vitamin D deficiency    Daily supplements      Relevant Orders   VITAMIN D 25 Hydroxy (Vit-D Deficiency, Fractures) (Completed)   Comprehensive metabolic panel (Completed)   TSH (Completed)    Other Visit Diagnoses    High risk medication use    -  Primary   Relevant Orders   Pain Mgmt, Profile 8 w/Conf, U   Folate (Completed)   Vitamin B1      I have discontinued Tomio Ullmer's HYDROmorphone. I am also having him maintain his Omega-3 Fatty Acids (FISH OIL PO), traMADol, tamsulosin, diazepam, and diclofenac sodium.  Meds ordered this encounter  Medications  . diazepam (VALIUM) 10 MG tablet    Sig: Take 1 tablet (10 mg total) by mouth daily as needed.    Dispense:  30 tablet    Refill:  2    CMA served as scribe during this visit. History, Physical and Plan performed by medical provider. Documentation and orders reviewed and attested to.  Danise Edge, MD

## 2018-02-13 NOTE — Assessment & Plan Note (Signed)
Encouraged heart healthy diet, increase exercise, avoid trans fats, consider a krill oil cap daily 

## 2018-02-13 NOTE — Assessment & Plan Note (Signed)
Daily supplements 

## 2018-02-15 NOTE — Assessment & Plan Note (Signed)
Using Diazepam prn with good results, uds and contract utd and refill allowed

## 2018-02-15 NOTE — Assessment & Plan Note (Signed)
Patient denies any difficulties at home. No trouble with ADLs, depression or falls. See EMR for functional status screen and depression screen. No recent changes to vision or hearing. Is UTD with immunizations. Is UTD with screening. Discussed Advanced Directives. Encouraged heart healthy diet, exercise as tolerated and adequate sleep. See patient's problem list for health risk factors to monitor. See AVS for preventative healthcare recommendation schedule. 

## 2018-02-18 LAB — PAIN MGMT, PROFILE 8 W/CONF, U
6 ACETYLMORPHINE: NEGATIVE ng/mL (ref <10)
AMINOCLONAZEPAM: NEGATIVE ng/mL (ref <25)
AMPHETAMINES: NEGATIVE ng/mL (ref <500)
Alcohol Metabolites: POSITIVE ng/mL — AB (ref <500)
Alphahydroxyalprazolam: NEGATIVE ng/mL (ref <25)
Alphahydroxymidazolam: NEGATIVE ng/mL (ref <50)
Alphahydroxytriazolam: NEGATIVE ng/mL (ref <50)
Benzodiazepines: POSITIVE ng/mL — AB (ref <100)
Buprenorphine, Urine: NEGATIVE ng/mL (ref <5)
Cocaine Metabolite: NEGATIVE ng/mL (ref <150)
Creatinine: 103.8 mg/dL
Ethyl Glucuronide (ETG): 1303 ng/mL — ABNORMAL HIGH (ref <500)
Ethyl Sulfate (ETS): 235 ng/mL — ABNORMAL HIGH (ref <100)
HYDROXYETHYLFLURAZEPAM: NEGATIVE ng/mL (ref <50)
Lorazepam: NEGATIVE ng/mL (ref <50)
MARIJUANA METABOLITE: NEGATIVE ng/mL (ref <20)
MDMA: NEGATIVE ng/mL (ref <500)
NORDIAZEPAM: 248 ng/mL — AB (ref <50)
OPIATES: NEGATIVE ng/mL (ref <100)
OXAZEPAM: 375 ng/mL — AB (ref <50)
OXIDANT: NEGATIVE ug/mL (ref <200)
OXYCODONE: NEGATIVE ng/mL (ref <100)
PH: 5.97 (ref 4.5–9.0)
Temazepam: 452 ng/mL — ABNORMAL HIGH (ref <50)

## 2018-02-18 LAB — VITAMIN B1: Vitamin B1 (Thiamine): 11 nmol/L (ref 8–30)

## 2018-04-25 DIAGNOSIS — H52203 Unspecified astigmatism, bilateral: Secondary | ICD-10-CM | POA: Diagnosis not present

## 2018-04-25 DIAGNOSIS — H353122 Nonexudative age-related macular degeneration, left eye, intermediate dry stage: Secondary | ICD-10-CM | POA: Diagnosis not present

## 2018-04-25 DIAGNOSIS — H25812 Combined forms of age-related cataract, left eye: Secondary | ICD-10-CM | POA: Diagnosis not present

## 2018-04-25 DIAGNOSIS — H353111 Nonexudative age-related macular degeneration, right eye, early dry stage: Secondary | ICD-10-CM | POA: Diagnosis not present

## 2018-06-06 ENCOUNTER — Telehealth: Payer: Self-pay | Admitting: Family Medicine

## 2018-06-06 MED ORDER — DIAZEPAM 10 MG PO TABS: 10.0000 mg | ORAL_TABLET | Freq: Every day | ORAL | 0 refills | Status: DC | PRN

## 2018-06-06 MED FILL — diazePAM 10 MG TABS: 10 | 30 days supply | Qty: 30 | Fill #0

## 2018-06-06 NOTE — Telephone Encounter (Signed)
Request for Valuim 10 mg tab, LR 02/13/18 for  #30 tabs and 2 refills  [facility] LOV  02/13/18 NOV  08/14/18 Dr. Abner GreenspanBlyth

## 2018-06-06 NOTE — Addendum Note (Signed)
Addended by: Willow OraPAZ, Jashan Cotten E on: 06/06/2018 01:42 PM   Modules accepted: Orders

## 2018-06-06 NOTE — Telephone Encounter (Signed)
Patient requesting refill on Valium 10 mg. Last refill 02/13/18 last OV 02/13/18. Please advise.

## 2018-06-06 NOTE — Telephone Encounter (Signed)
Copied from CRM [phone]. Topic: Quick Communication - Rx Refill/Question >> Jun 06, 2018  8:08 AM Cipriano BunkerLambe, Annette S wrote:  Medication:  diazepam (VALIUM) 10 MG tablet  Has the patient contacted their pharmacy? Yes.   (Agent: If no, request that the patient contact the pharmacy for the refill.) (Agent: If yes, when and what did the pharmacy advise?)  Preferred Pharmacy (with phone number or street name):  [facility] [place], [place] [id number] [place] [address] Phone: [phone] Fax: [phone]    Agent: Please be advised that RX refills may take up to 3 business days. We ask that you follow-up with your pharmacy.

## 2018-06-06 NOTE — Telephone Encounter (Signed)
Sent!

## 2018-07-01 DIAGNOSIS — R972 Elevated prostate specific antigen [PSA]: Secondary | ICD-10-CM | POA: Diagnosis not present

## 2018-07-15 DIAGNOSIS — N138 Other obstructive and reflux uropathy: Secondary | ICD-10-CM | POA: Diagnosis not present

## 2018-07-15 DIAGNOSIS — R972 Elevated prostate specific antigen [PSA]: Secondary | ICD-10-CM | POA: Diagnosis not present

## 2018-07-15 DIAGNOSIS — N529 Male erectile dysfunction, unspecified: Secondary | ICD-10-CM | POA: Diagnosis not present

## 2018-07-15 DIAGNOSIS — N401 Enlarged prostate with lower urinary tract symptoms: Secondary | ICD-10-CM | POA: Diagnosis not present

## 2018-07-25 ENCOUNTER — Other Ambulatory Visit: Payer: Self-pay

## 2018-08-14 ENCOUNTER — Ambulatory Visit (INDEPENDENT_AMBULATORY_CARE_PROVIDER_SITE_OTHER): Payer: Medicare Other | Admitting: Family Medicine

## 2018-08-14 VITALS — BP 122/68 | HR 59 | Temp 98.1°F | Resp 18 | Wt 176.2 lb

## 2018-08-14 DIAGNOSIS — M25562 Pain in left knee: Secondary | ICD-10-CM

## 2018-08-14 DIAGNOSIS — E559 Vitamin D deficiency, unspecified: Secondary | ICD-10-CM | POA: Diagnosis not present

## 2018-08-14 DIAGNOSIS — R972 Elevated prostate specific antigen [PSA]: Secondary | ICD-10-CM

## 2018-08-14 DIAGNOSIS — R739 Hyperglycemia, unspecified: Secondary | ICD-10-CM

## 2018-08-14 DIAGNOSIS — E782 Mixed hyperlipidemia: Secondary | ICD-10-CM

## 2018-08-14 DIAGNOSIS — G6289 Other specified polyneuropathies: Secondary | ICD-10-CM | POA: Diagnosis not present

## 2018-08-14 NOTE — Patient Instructions (Signed)
Lidocaine gel by the companies [employer] and [employer]  Cholesterol Cholesterol is a white, waxy, fat-like substance that is needed by the human body in small amounts. The liver makes all the cholesterol we need. Cholesterol is carried from the liver by the blood through the blood vessels. Deposits of cholesterol (plaques) may build up on blood vessel (artery) walls. Plaques make the arteries narrower and stiffer. Cholesterol plaques increase the risk for heart attack and stroke. You cannot feel your cholesterol level even if it is very high. The only way to know that it is high is to have a blood test. Once you know your cholesterol levels, you should keep a record of the test results. Work with your health care provider to keep your levels in the desired range. What do the results mean?  Total cholesterol is a rough measure of all the cholesterol in your blood.  LDL (low-density lipoprotein) is the "bad" cholesterol. This is the type that causes plaque to build up on the artery walls. You want this level to be low.  HDL (high-density lipoprotein) is the "good" cholesterol because it cleans the arteries and carries the LDL away. You want this level to be high.  Triglycerides are fat that the body can either burn for energy or store. High levels are closely linked to heart disease. What are the desired levels of cholesterol?  Total cholesterol below 200.  LDL below 100 for people who are at risk, below 70 for people at very high risk.  HDL above 40 is good. A level of 60 or higher is considered to be protective against heart disease.  Triglycerides below 150. How can I lower my cholesterol? Diet Follow your diet program as told by your health care provider.  Choose fish or white meat chicken and Mal[place]key, roasted or baked. Limit fatty cuts of red meat, fried foods, and processed meats, such as sausage and lunch meats.  Eat lots of fresh fruits and vegetables.  Choose whole  grains, beans, pasta, potatoes, and cereals.  Choose olive oil, corn oil, or canola oil, and use only small amounts.  Avoid butter, mayonnaise, shortening, or palm kernel oils.  Avoid foods with trans fats.  Drink skim or nonfat milk and eat low-fat or nonfat yogurt and cheeses. Avoid whole milk, cream, ice cream, egg yolks, and full-fat cheeses.  Healthier desserts include angel food cake, ginger snaps, animal crackers, hard candy, popsicles, and low-fat or nonfat frozen yogurt. Avoid pastries, cakes, pies, and cookies.  Exercise  Follow your exercise program as told by your health care provider. A regular program: ? Helps to decrease LDL and raise HDL. ? Helps with weight control.  Do things that increase your activity level, such as gardening, walking, and taking the stairs.  Ask your health care provider about ways that you can be more active in your daily life.  Medicine  Take over-the-counter and prescription medicines only as told by your health care provider. ? Medicine may be prescribed by your health care provider to help lower cholesterol and decrease the risk for heart disease. This is usually done if diet and exercise have failed to bring down cholesterol levels. ? If you have several risk factors, you may need medicine even if your levels are normal.  This information is not intended to replace advice given to you by your health care provider. Make sure you discuss any questions you have with your health care provider. Document Released: 09/04/2001 Document Revised: 07/07/2016 Document  Reviewed: 06/09/2016 Elsevier Interactive Patient Education  [employer].

## 2018-08-14 NOTE — Assessment & Plan Note (Signed)
Supplement and monitor 

## 2018-08-14 NOTE — Assessment & Plan Note (Signed)
Follows with Dr Margreta Journeyahldorf and as just had a course of gel shots and that has helped some, encouraged to keep active.

## 2018-08-17 NOTE — Progress Notes (Signed)
Subjective:    Patient ID: Christopher Haynes, male    DOB: Sep 16, 1946, 72 y.o.   MRN: [id number]  No chief complaint on file.   HPI Patient is in today for follow up and he is feeling well. Is following with Dr Margreta Journey of orthopaedics and is doing better after a course of injections. No recent febrile illness or hospitalizations. No acute concerns. Denies CP/palp/SOB/HA/congestion/fevers/GI or GU c/o. Taking meds as prescribed  Past Medical History:  Diagnosis Date  . Anxiety 08/05/2017  . Back pain 08/20/2016  . Benign prostatic hyperplasia (BPH) with urinary urgency 02/09/2016  . Constipation 02/20/2016  . Contusion 02/20/2016  . Elevated PSA   . H/O measles   . Headache   . History of colonic polyps 02/09/2016   [facility]. Now on 5 year plan  . Hyperglycemia 08/20/2016  . Hyperlipidemia, mixed 02/08/2017  . Macular degeneration of left eye 02/19/2016  . Migraine with visual aura 02/09/2016  . Neck pain 08/05/2017  . OSA on CPAP   . Pain in joint, shoulder region 02/19/2016  . Peripheral neuropathy 08/05/2017  . Thyroid disease    thyroiditis, h/o in 65s    Past Surgical History:  Procedure Laterality Date  . ANKLE FRACTURE SURGERY  66  . KNEE ARTHROSCOPY WITH SUBCHONDROPLASTY Left 12/13/2017   Procedure: KNEE ARTHROSCOPY WITH SUBCHONDROPLASTY;  Surgeon: Marcene Corning, MD;  Location: [facility];  Service: Orthopedics;  Laterality: Left;  . SKIN GRAFT  Age 17  . TONSILLECTOMY  Age 44    Family History  Problem Relation Age of Onset  . Hypertension Mother   . Diabetes Mother   . Colon cancer Father   . Cancer Father        rectal with mets  . Hypertension Brother   . Obesity Son   . Other Son        fatty liver  . Allergic Disorder Son        seasonal  . Stroke Paternal Grandfather     Social History   Socioeconomic History  . Marital status: Married    Spouse name: Not on file  . Number of children: Not  on file  . Years of education: Not on file  . Highest education level: Not on file  Occupational History  . Occupation: Customer service manager  Social Needs  . Financial resource strain: Not on file  . Food insecurity:    Worry: Not on file    Inability: Not on file  . Transportation needs:    Medical: Not on file    Non-medical: Not on file  Tobacco Use  . Smoking status: Never Smoker  . Smokeless tobacco: Never Used  Substance and Sexual Activity  . Alcohol use: Yes    Alcohol/week: 0.0 standard drinks  . Drug use: No  . Sexual activity: Yes    Comment: lives with wife, work in Aud[employer] estate, no dietary restrictions  Lifestyle  . Physical activity:    Days per week: Not on file    Minutes per session: Not on file  . Stress: Not on file  Relationships  . Social connections:    Talks on phone: Not on file    Gets together: Not on file    Attends religious service: Not on file    Active member of club or organization: Not on file    Attends meetings of clubs or organizations: Not on file  Relationship status: Not on file  . Intimate partner violence:    Fear of current or ex partner: Not on file    Emotionally abused: Not on file    Physically abused: Not on file    Forced sexual activity: Not on file  Other Topics Concern  . Not on file  Social History Narrative  . Not on file    Outpatient Medications Prior to Visit  Medication Sig Dispense Refill  . diazepam (VALIUM) 10 MG tablet Take 1 tablet (10 mg total) by mouth daily as needed. 30 tablet 0  . diclofenac sodium (VOLTAREN) 1 % GEL Apply 2 g topically 4 (four) times daily.    . Omega-3 Fatty Acids (FISH OIL PO) Take by mouth daily.    . tamsulosin (FLOMAX) 0.4 MG CAPS capsule Take 0.4 mg by mouth.    . traMADol (ULTRAM) 50 MG tablet Take 1 tablet (50 mg total) by mouth daily as needed. 30 tablet 0   No facility-administered medications prior to visit.     Allergies  Allergen Reactions  . Aspirin Hives     Patient states that takes Aspirin regularly without issues. [facility] with hives because can exacerbate hives.    Review of Systems  Constitutional: Negative for fever and malaise/fatigue.  HENT: Negative for congestion.   Eyes: Negative for blurred vision.  Respiratory: Negative for shortness of breath.   Cardiovascular: Negative for chest pain, palpitations and leg swelling.  Gastrointestinal: Negative for abdominal pain, blood in stool and nausea.  Genitourinary: Negative for dysuria and frequency.  Musculoskeletal: Negative for falls.  Skin: Negative for rash.  Neurological: Positive for sensory change. Negative for dizziness, loss of consciousness and headaches.  Endo/Heme/Allergies: Negative for environmental allergies.  Psychiatric/Behavioral: Negative for depression. The patient is not nervous/anxious.        Objective:    Physical Exam  Constitutional: He is oriented to person, place, and time. He appears well-developed and well-nourished. No distress.  HENT:  Head: Normocephalic and atraumatic.  Nose: Nose normal.  Eyes: Right eye exhibits no discharge. Left eye exhibits no discharge.  Neck: Normal range of motion. Neck supple.  Cardiovascular: Normal rate and regular rhythm.  No murmur heard. Pulmonary/Chest: Effort normal and breath sounds normal.  Abdominal: Soft. Bowel sounds are normal. There is no tenderness.  Musculoskeletal: He exhibits no edema.  Neurological: He is alert and oriented to person, place, and time.  Skin: Skin is warm and dry.  Psychiatric: He has a normal mood and affect.  Nursing note and vitals reviewed.   BP 122/68 (BP Location: Left Arm, Patient Position: Sitting, Cuff Size: Normal)   Pulse (!) 59   Temp 98.1 F (36.7 C) (Oral)   Resp 18   Wt 176 lb 3.2 oz (79.9 kg)   SpO2 96%   BMI 29.32 kg/m  Wt Readings from Last 3 Encounters:  08/14/18 176 lb 3.2 oz (79.9 kg)  02/13/18 174 lb 12.8 oz (79.3 kg)    12/13/17 171 lb 2 oz (77.6 kg)     Lab Results  Component Value Date   WBC 4.2 02/13/2018   HGB 14.4 02/13/2018   HCT 43.2 02/13/2018   PLT 249.0 02/13/2018   GLUCOSE 98 02/13/2018   CHOL 199 02/13/2018   TRIG 113.0 02/13/2018   HDL 64.20 02/13/2018   LDLCALC 112 (H) 02/13/2018   ALT 14 02/13/2018   AST 15 02/13/2018   NA 142 02/13/2018   K 4.8  02/13/2018   CL 106 02/13/2018   CREATININE 0.89 02/13/2018   BUN 15 02/13/2018   CO2 31 02/13/2018   TSH 2.25 02/13/2018   PSA 7.85 (H) 02/08/2017   HGBA1C 5.8 02/13/2018    Lab Results  Component Value Date   TSH 2.25 02/13/2018   Lab Results  Component Value Date   WBC 4.2 02/13/2018   HGB 14.4 02/13/2018   HCT 43.2 02/13/2018   MCV 90.4 02/13/2018   PLT 249.0 02/13/2018   Lab Results  Component Value Date   NA 142 02/13/2018   K 4.8 02/13/2018   CO2 31 02/13/2018   GLUCOSE 98 02/13/2018   BUN 15 02/13/2018   CREATININE 0.89 02/13/2018   BILITOT 0.6 02/13/2018   ALKPHOS 94 02/13/2018   AST 15 02/13/2018   ALT 14 02/13/2018   PROT 6.9 02/13/2018   ALBUMIN 4.0 02/13/2018   CALCIUM 9.7 02/13/2018   GFR 89.36 02/13/2018   Lab Results  Component Value Date   CHOL 199 02/13/2018   Lab Results  Component Value Date   HDL 64.20 02/13/2018   Lab Results  Component Value Date   LDLCALC 112 (H) 02/13/2018   Lab Results  Component Value Date   TRIG 113.0 02/13/2018   Lab Results  Component Value Date   CHOLHDL 3 02/13/2018   Lab Results  Component Value Date   HGBA1C 5.8 02/13/2018       Assessment & Plan:   Problem List Items Addressed This Visit    Elevated PSA   Hyperglycemia    hgba1c acceptable, minimize simple carbs. Increase exercise as tolerated.       Relevant Orders   TSH   Comprehensive metabolic panel   Hemoglobin A1c   Hyperlipidemia, mixed - Primary   Relevant Orders   TSH   Lipid panel   Knee pain, left    Follows with Dr Margreta Journey and as just had a course of gel shots  and that has helped some, encouraged to keep active.      Peripheral neuropathy   Relevant Orders   TSH   CBC   Vitamin D deficiency    Supplement and monitor      Relevant Orders   VITAMIN D 25 Hydroxy (Vit-D Deficiency, Fractures)   TSH   Comprehensive metabolic panel      I am having Lurline Idol maintain his Omega-3 Fatty Acids (FISH OIL PO), traMADol, tamsulosin, diclofenac sodium, and diazepam.  No orders of the defined types were placed in this encounter.    Danise Edge, MD

## 2018-08-17 NOTE — Assessment & Plan Note (Signed)
hgba1c acceptable, minimize simple carbs. Increase exercise as tolerated.  

## 2018-10-17 ENCOUNTER — Other Ambulatory Visit: Payer: Self-pay | Admitting: Internal Medicine

## 2018-10-20 NOTE — Telephone Encounter (Signed)
Requesting:Valium  Contract:yes UDS:low risk next screen 08/18/18 Last OV:08/14/18 Next OV:02/19/19 Last Refill:06/06/18  #[id number] Database:   Please advise

## 2018-10-21 MED FILL — diazePAM 10 MG TABS: 10 | 30 days supply | Qty: 30 | Fill #0

## 2018-12-26 ENCOUNTER — Other Ambulatory Visit: Payer: Self-pay | Admitting: Family Medicine

## 2018-12-29 NOTE — Telephone Encounter (Signed)
Requesting:valium Contract:yes UDS:low risk next screen 08/18/18 Last OV:08/14/18 Next OV:02/19/19 Last Refill:10/20/18  #[id number] Database:   Please advise

## 2018-12-30 MED FILL — DIAZEPAM 10 MG TABS: 10 | 30 days supply | Qty: 30 | Fill #0

## 2019-01-23 DIAGNOSIS — R972 Elevated prostate specific antigen [PSA]: Secondary | ICD-10-CM | POA: Diagnosis not present

## 2019-02-05 DIAGNOSIS — N529 Male erectile dysfunction, unspecified: Secondary | ICD-10-CM | POA: Diagnosis not present

## 2019-02-05 DIAGNOSIS — N138 Other obstructive and reflux uropathy: Secondary | ICD-10-CM | POA: Diagnosis not present

## 2019-02-05 DIAGNOSIS — R972 Elevated prostate specific antigen [PSA]: Secondary | ICD-10-CM | POA: Diagnosis not present

## 2019-02-05 DIAGNOSIS — N401 Enlarged prostate with lower urinary tract symptoms: Secondary | ICD-10-CM | POA: Diagnosis not present

## 2019-02-16 ENCOUNTER — Other Ambulatory Visit (INDEPENDENT_AMBULATORY_CARE_PROVIDER_SITE_OTHER): Payer: Medicare Other

## 2019-02-16 ENCOUNTER — Other Ambulatory Visit: Payer: Self-pay

## 2019-02-16 DIAGNOSIS — G6289 Other specified polyneuropathies: Secondary | ICD-10-CM

## 2019-02-16 DIAGNOSIS — R739 Hyperglycemia, unspecified: Secondary | ICD-10-CM

## 2019-02-16 DIAGNOSIS — E782 Mixed hyperlipidemia: Secondary | ICD-10-CM

## 2019-02-16 DIAGNOSIS — E559 Vitamin D deficiency, unspecified: Secondary | ICD-10-CM | POA: Diagnosis not present

## 2019-02-16 LAB — COMPREHENSIVE METABOLIC PANEL
ALK PHOS: 98 U/L (ref 39–117)
ALT: 25 U/L (ref 0–53)
AST: 20 U/L (ref 0–37)
Albumin: 4.2 g/dL (ref 3.5–5.2)
BUN: 17 mg/dL (ref 6–23)
CHLORIDE: 107 meq/L (ref 96–112)
CO2: 30 mEq/L (ref 19–32)
Calcium: 9.6 mg/dL (ref 8.4–10.5)
Creatinine, Ser: 0.95 mg/dL (ref 0.40–1.50)
GFR: 77.76 mL/min (ref >60.00)
GLUCOSE: 103 mg/dL — AB (ref 70–99)
POTASSIUM: 5.4 meq/L — AB (ref 3.5–5.1)
Sodium: 144 mEq/L (ref 135–145)
Total Bilirubin: 0.5 mg/dL (ref 0.2–1.2)
Total Protein: 6.5 g/dL (ref 6.0–8.3)

## 2019-02-16 LAB — LIPID PANEL
CHOLESTEROL: 232 mg/dL — AB (ref 0–200)
HDL: 55.8 mg/dL (ref >39.00)
LDL CALC: 152 mg/dL — AB (ref 0–99)
NONHDL: 175.71
Total CHOL/HDL Ratio: 4
Triglycerides: 117 mg/dL (ref 0.0–149.0)
VLDL: 23.4 mg/dL (ref 0.0–40.0)

## 2019-02-16 LAB — CBC
HEMATOCRIT: 42.1 % (ref 39.0–52.0)
Hemoglobin: 14.2 g/dL (ref 13.0–17.0)
MCHC: 33.7 g/dL (ref 30.0–36.0)
MCV: 89.4 fl (ref 78.0–100.0)
PLATELETS: 246 10*3/uL (ref 150.0–400.0)
RBC: 4.71 Mil/uL (ref 4.22–5.81)
RDW: 13.1 % (ref 11.5–15.5)
WBC: 4.5 10*3/uL (ref 4.0–10.5)

## 2019-02-16 LAB — HEMOGLOBIN A1C: Hgb A1c MFr Bld: 5.8 % (ref 4.6–6.5)

## 2019-02-16 LAB — VITAMIN D 25 HYDROXY (VIT D DEFICIENCY, FRACTURES): VITD: 45.02 ng/mL (ref 30.00–100.00)

## 2019-02-16 LAB — TSH: TSH: 2.9 u[IU]/mL (ref 0.35–4.50)

## 2019-02-19 ENCOUNTER — Ambulatory Visit (INDEPENDENT_AMBULATORY_CARE_PROVIDER_SITE_OTHER): Payer: Medicare Other | Admitting: Family Medicine

## 2019-02-19 ENCOUNTER — Encounter: Payer: Self-pay | Admitting: Family Medicine

## 2019-02-19 VITALS — BP 122/82 | HR 65 | Temp 98.4°F | Resp 18 | Ht 64.0 in | Wt 187.4 lb

## 2019-02-19 DIAGNOSIS — E782 Mixed hyperlipidemia: Secondary | ICD-10-CM

## 2019-02-19 DIAGNOSIS — Z79899 Other long term (current) drug therapy: Secondary | ICD-10-CM | POA: Diagnosis not present

## 2019-02-19 DIAGNOSIS — R972 Elevated prostate specific antigen [PSA]: Secondary | ICD-10-CM

## 2019-02-19 DIAGNOSIS — K439 Ventral hernia without obstruction or gangrene: Secondary | ICD-10-CM

## 2019-02-19 DIAGNOSIS — E559 Vitamin D deficiency, unspecified: Secondary | ICD-10-CM

## 2019-02-19 DIAGNOSIS — G44209 Tension-type headache, unspecified, not intractable: Secondary | ICD-10-CM | POA: Diagnosis not present

## 2019-02-19 DIAGNOSIS — R51 Headache: Secondary | ICD-10-CM

## 2019-02-19 DIAGNOSIS — R3915 Urgency of urination: Secondary | ICD-10-CM

## 2019-02-19 DIAGNOSIS — E669 Obesity, unspecified: Secondary | ICD-10-CM

## 2019-02-19 DIAGNOSIS — N401 Enlarged prostate with lower urinary tract symptoms: Secondary | ICD-10-CM

## 2019-02-19 DIAGNOSIS — R739 Hyperglycemia, unspecified: Secondary | ICD-10-CM | POA: Diagnosis not present

## 2019-02-19 DIAGNOSIS — R519 Headache, unspecified: Secondary | ICD-10-CM | POA: Insufficient documentation

## 2019-02-19 DIAGNOSIS — R252 Cramp and spasm: Secondary | ICD-10-CM

## 2019-02-19 DIAGNOSIS — Z0000 Encounter for general adult medical examination without abnormal findings: Secondary | ICD-10-CM

## 2019-02-19 LAB — COMPREHENSIVE METABOLIC PANEL
ALT: 27 U/L (ref 0–53)
AST: 21 U/L (ref 0–37)
Albumin: 4.3 g/dL (ref 3.5–5.2)
Alkaline Phosphatase: 99 U/L (ref 39–117)
BUN: 18 mg/dL (ref 6–23)
CO2: 30 mEq/L (ref 19–32)
Calcium: 9.4 mg/dL (ref 8.4–10.5)
Chloride: 102 mEq/L (ref 96–112)
Creatinine, Ser: 0.88 mg/dL (ref 0.40–1.50)
GFR: 84.94 mL/min (ref >60.00)
GLUCOSE: 90 mg/dL (ref 70–99)
Potassium: 4.4 mEq/L (ref 3.5–5.1)
Sodium: 139 mEq/L (ref 135–145)
Total Bilirubin: 0.5 mg/dL (ref 0.2–1.2)
Total Protein: 6.8 g/dL (ref 6.0–8.3)

## 2019-02-19 LAB — MAGNESIUM: Magnesium: 2 mg/dL (ref 1.5–2.5)

## 2019-02-19 MED ORDER — DIAZEPAM 10 MG PO TABS: 10.0000 mg | ORAL_TABLET | Freq: Every day | ORAL | 0 refills | Status: DC | PRN

## 2019-02-19 MED FILL — DIAZEPAM 10 MG TABS: 10 | 30 days supply | Qty: 30 | Fill #0

## 2019-02-19 NOTE — Assessment & Plan Note (Signed)
Following with Dr Pete Glatter.

## 2019-02-19 NOTE — Assessment & Plan Note (Signed)
Encouraged DASH diet, decrease po intake and increase exercise as tolerated. Needs 7-8 hours of sleep nightly. Avoid trans fats, eat small, frequent meals every 4-5 hours with lean proteins, complex carbs and healthy fats. Minimize simple carbs, declines referral  

## 2019-02-19 NOTE — Assessment & Plan Note (Signed)
Tolerating statin, encouraged heart healthy diet, avoid trans fats, minimize simple carbs and saturated fats. Increase exercise as tolerated 

## 2019-02-19 NOTE — Assessment & Plan Note (Addendum)
Continues to follow with Dr Pete Glatter, MR last year c/w BPH in appearance. They continue to monitor

## 2019-02-19 NOTE — Assessment & Plan Note (Signed)
Patient encouraged to maintain heart healthy diet, regular exercise, adequate sleep. Consider daily probiotics. Take medications as prescribed 

## 2019-02-19 NOTE — Assessment & Plan Note (Signed)
hgba1c acceptable, minimize simple carbs. Increase exercise as tolerated.  

## 2019-02-19 NOTE — Assessment & Plan Note (Signed)
Supplement and monitor 

## 2019-02-19 NOTE — Assessment & Plan Note (Signed)
Has been avoiding core exercises. No pain or symptoms.

## 2019-02-19 NOTE — Assessment & Plan Note (Addendum)
Encouraged increased hydration, 64 ounces of clear fluids daily. Minimize alcohol and caffeine. Eat small frequent meals with lean proteins and complex carbs. Avoid high and low blood sugars. Get adequate sleep, 7-8 hours a night. Needs exercise daily preferably in the morning. He is having this worked up at the [place]. He is haing 2-3 bad headaches a month now. Had a CT scan done at [facility] February 12, 2019. This was normal. His headaches start in the neck on the let then generalize to scalp and end in pain behind eye which leads to nausea. Does not endorse noise sensitivity. Maybe some photophobia. He is following up with the [place] in April. They tried Sumatriptan but it bothered his stomach. Neurology at [facility] follows him for the pain and a history of vertigo and ocular migraine

## 2019-02-22 NOTE — Progress Notes (Signed)
Subjective:    Patient ID: Christopher Haynes, male    DOB: November 07, 1946, 73 y.o.   MRN: [id number]  No chief complaint on file.   HPI Patient is in today for preventative exam and follow-up on chronic medical concerns.  He has been struggling with headaches that he says started in his left neck generalized to his occiput and then spread causing pain behind his eye.  He has been following with neurology they tried sumatriptan but it caused stomach trouble.  He did have a CAT scan of his head and it was unremarkable.  He notes he has some left shoulder and left arm pain and cramps at times as well.  No recent fall or trauma.  He does have an abdominal hernia so he is worried about exercising extensively.  No recent febrile illness or hospitalization.  He is managing his activities of daily living well.  Maintains a heart healthy diet and tries to exercise several times a week. Denies CP/palp/SOB/HA/congestion/fevers/GI or GU c/o. Taking meds as prescribed  Past Medical History:  Diagnosis Date  . Anxiety 08/05/2017  . Back pain 08/20/2016  . Benign prostatic hyperplasia (BPH) with urinary urgency 02/09/2016  . Constipation 02/20/2016  . Contusion 02/20/2016  . Elevated PSA   . H/O measles   . Headache   . History of colonic polyps 02/09/2016   [facility]. Now on 5 year plan  . Hyperglycemia 08/20/2016  . Hyperlipidemia, mixed 02/08/2017  . Macular degeneration of left eye 02/19/2016  . Migraine with visual aura 02/09/2016  . Neck pain 08/05/2017  . OSA on CPAP   . Pain in joint, shoulder region 02/19/2016  . Peripheral neuropathy 08/05/2017  . Thyroid disease    thyroiditis, h/o in 46s    Past Surgical History:  Procedure Laterality Date  . ANKLE FRACTURE SURGERY  66  . KNEE ARTHROSCOPY WITH SUBCHONDROPLASTY Left 12/13/2017   Procedure: KNEE ARTHROSCOPY WITH SUBCHONDROPLASTY;  Surgeon: Marcene Corning, MD;  Location: [facility];  Service:  Orthopedics;  Laterality: Left;  . SKIN GRAFT  Age 12  . TONSILLECTOMY  Age 57    Family History  Problem Relation Age of Onset  . Hypertension Mother   . Diabetes Mother   . Colon cancer Father   . Cancer Father        rectal with mets  . Hypertension Brother   . Obesity Son   . Other Son        fatty liver  . Allergic Disorder Son        seasonal  . Stroke Paternal Grandfather     Social History   Socioeconomic History  . Marital status: Married    Spouse name: Not on file  . Number of children: Not on file  . Years of education: Not on file  . Highest education level: Not on file  Occupational History  . Occupation: Customer service manager  Social Needs  . Financial resource strain: Not on file  . Food insecurity:    Worry: Not on file    Inability: Not on file  . Transportation needs:    Medical: Not on file    Non-medical: Not on file  Tobacco Use  . Smoking status: Never Smoker  . Smokeless tobacco: Never Used  Substance and Sexual Activity  . Alcohol use: Yes    Alcohol/week: 0.0 standard drinks  . Drug use: No  . Sexual activity: Yes  Comment: lives with wife, work in [employer] estate, no dietary restrictions  Lifestyle  . Physical activity:    Days per week: Not on file    Minutes per session: Not on file  . Stress: Not on file  Relationships  . Social connections:    Talks on phone: Not on file    Gets together: Not on file    Attends religious service: Not on file    Active member of club or organization: Not on file    Attends meetings of clubs or organizations: Not on file    Relationship status: Not on file  . Intimate partner violence:    Fear of current or ex partner: Not on file    Emotionally abused: Not on file    Physically abused: Not on file    Forced sexual activity: Not on file  Other Topics Concern  . Not on file  Social History Narrative  . Not on file    Outpatient Medications Prior to Visit  Medication Sig Dispense Refill  .  diclofenac sodium (VOLTAREN) 1 % GEL Apply 2 g topically 4 (four) times daily.    . Omega-3 Fatty Acids (FISH OIL PO) Take by mouth daily.    . tamsulosin (FLOMAX) 0.4 MG CAPS capsule Take 0.4 mg by mouth.    . traMADol (ULTRAM) 50 MG tablet Take 1 tablet (50 mg total) by mouth daily as needed. 30 tablet 0  . diazepam (VALIUM) 10 MG tablet TAKE 1 TABLET BY MOUTH ONCE DAILY AS NEEDED 30 tablet 0   No facility-administered medications prior to visit.     Allergies  Allergen Reactions  . Aspirin Hives    Patient states that takes Aspirin regularly without issues. Per primary at [facility] recommends no asa with hives because can exacerbate hives.    Review of Systems  Constitutional: Negative for chills, fever and malaise/fatigue.  HENT: Negative for congestion and hearing loss.   Eyes: Negative for discharge.  Respiratory: Negative for cough, sputum production and shortness of breath.   Cardiovascular: Negative for chest pain, palpitations and leg swelling.  Gastrointestinal: Negative for abdominal pain, blood in stool, constipation, diarrhea, heartburn, nausea and vomiting.  Genitourinary: Negative for dysuria, frequency, hematuria and urgency.  Musculoskeletal: Positive for joint pain, myalgias and neck pain. Negative for back pain and falls.  Skin: Negative for rash.  Neurological: Positive for headaches. Negative for dizziness, sensory change, loss of consciousness and weakness.  Endo/Heme/Allergies: Negative for environmental allergies. Does not bruise/bleed easily.  Psychiatric/Behavioral: Negative for depression and suicidal ideas. The patient is not nervous/anxious and does not have insomnia.        Objective:    Physical Exam Vitals signs and nursing note reviewed.  Constitutional:      General: He is not in acute distress.    Appearance: He is well-developed.  HENT:     Head: Normocephalic and atraumatic.     Nose: Nose normal.  Eyes:     General:        Right eye: No  discharge.        Left eye: No discharge.  Neck:     Musculoskeletal: Normal range of motion and neck supple.  Cardiovascular:     Rate and Rhythm: Normal rate and regular rhythm.     Heart sounds: No murmur.  Pulmonary:     Effort: Pulmonary effort is normal.     Breath sounds: Normal breath sounds.  Abdominal:     General: Bowel sounds  are normal. There is no distension.     Palpations: Abdomen is soft. There is no mass.     Tenderness: There is no abdominal tenderness. There is no guarding or rebound.     Hernia: A hernia is present.  Skin:    General: Skin is warm and dry.  Neurological:     Mental Status: He is alert and oriented to person, place, and time.     BP 122/82 (BP Location: Left Arm, Patient Position: Sitting, Cuff Size: Normal)   Pulse 65   Temp 98.4 F (36.9 C) (Oral)   Resp 18   Ht  (1.626 m)   Wt 187 lb 6.4 oz (85 kg)   SpO2 100%   BMI 32.17 kg/m  Wt Readings from Last 3 Encounters:  02/19/19 187 lb 6.4 oz (85 kg)  08/14/18 176 lb 3.2 oz (79.9 kg)  02/13/18 174 lb 12.8 oz (79.3 kg)     Lab Results  Component Value Date   WBC 4.5 02/16/2019   HGB 14.2 02/16/2019   HCT 42.1 02/16/2019   PLT 246.0 02/16/2019   GLUCOSE 90 02/19/2019   CHOL 232 (H) 02/16/2019   TRIG 117.0 02/16/2019   HDL 55.80 02/16/2019   LDLCALC 152 (H) 02/16/2019   ALT 27 02/19/2019   AST 21 02/19/2019   NA 139 02/19/2019   K 4.4 02/19/2019   CL 102 02/19/2019   CREATININE 0.88 02/19/2019   BUN 18 02/19/2019   CO2 30 02/19/2019   TSH 2.90 02/16/2019   PSA 7.85 (H) 02/08/2017   HGBA1C 5.8 02/16/2019    Lab Results  Component Value Date   TSH 2.90 02/16/2019   Lab Results  Component Value Date   WBC 4.5 02/16/2019   HGB 14.2 02/16/2019   HCT 42.1 02/16/2019   MCV 89.4 02/16/2019   PLT 246.0 02/16/2019   Lab Results  Component Value Date   NA 139 02/19/2019   K 4.4 02/19/2019   CO2 30 02/19/2019   GLUCOSE 90 02/19/2019   BUN 18 02/19/2019    CREATININE 0.88 02/19/2019   BILITOT 0.5 02/19/2019   ALKPHOS 99 02/19/2019   AST 21 02/19/2019   ALT 27 02/19/2019   PROT 6.8 02/19/2019   ALBUMIN 4.3 02/19/2019   CALCIUM 9.4 02/19/2019   GFR 84.94 02/19/2019   Lab Results  Component Value Date   CHOL 232 (H) 02/16/2019   Lab Results  Component Value Date   HDL 55.80 02/16/2019   Lab Results  Component Value Date   LDLCALC 152 (H) 02/16/2019   Lab Results  Component Value Date   TRIG 117.0 02/16/2019   Lab Results  Component Value Date   CHOLHDL 4 02/16/2019   Lab Results  Component Value Date   HGBA1C 5.8 02/16/2019       Assessment & Plan:   Problem List Items Addressed This Visit    Elevated PSA    Continues to follow with Dr Pete Glatter, MR last year c/w BPH in appearance. They continue to monitor      Benign prostatic hyperplasia (BPH) with urinary urgency    Following with Dr Pete Glatter.       Hyperglycemia    hgba1c acceptable, minimize simple carbs. Increase exercise as tolerated.       Relevant Orders   Comprehensive metabolic panel   Hemoglobin A1c   Hyperlipidemia, mixed    Tolerating statin, encouraged heart healthy diet, avoid trans fats, minimize simple carbs and saturated fats. Increase exercise  as tolerated      Relevant Orders   Lipid panel   Preventative health care    Patient encouraged to maintain heart healthy diet, regular exercise, adequate sleep. Consider daily probiotics. Take medications as prescribed      Vitamin D deficiency    Supplement and monitor      Headache    Encouraged increased hydration, 64 ounces of clear fluids daily. Minimize alcohol and caffeine. Eat small frequent meals with lean proteins and complex carbs. Avoid high and low blood sugars. Get adequate sleep, 7-8 hours a night. Needs exercise daily preferably in the morning. He is having this worked up at the [place]. He is haing 2-3 bad headaches a month now. Had a CT [facility] done [facility] February 12, 2019. This  was normal. His headaches start in the neck on the let then generalize to scalp and end in pain behind eye which leads to nausea. Does not endorse noise sensitivity. Maybe some photophobia. He is following up with the [place] in April. They tried Sumatriptan but it bothered his stomach. Neurology at [facility] [place] follows him for the pain and a history of vertigo and ocular migraine      Abdominal wall hernia    Has been avoiding core exercises. No pain or symptoms.       Obesity    Encouraged DASH diet, decrease po intake and increase exercise as tolerated. Needs 7-8 hours of sleep nightly. Avoid trans fats, eat small, frequent meals every 4-5 hours with lean proteins, complex carbs and healthy fats. Minimize simple carbs, declines referral       Other Visit Diagnoses    High risk medication use    -  Primary   Relevant Orders   Pain Mgmt, Profile 8 w/Conf, U   Muscle cramp       Relevant Orders   Comprehensive metabolic panel (Completed)   Magnesium (Completed)      I have changed Barbara Ertel's diazepam. I am also having him maintain his Omega-3 Fatty Acids (FISH OIL PO), traMADol, tamsulosin, and diclofenac sodium.  Meds ordered this encounter  Medications  . diazepam (VALIUM) 10 MG tablet    Sig: Take 1 tablet (10 mg total) by mouth daily as needed.    Dispense:  30 tablet    Refill:  0     Danise Edge, MD

## 2019-02-23 DIAGNOSIS — H353122 Nonexudative age-related macular degeneration, left eye, intermediate dry stage: Secondary | ICD-10-CM | POA: Diagnosis not present

## 2019-02-23 DIAGNOSIS — H25813 Combined forms of age-related cataract, bilateral: Secondary | ICD-10-CM | POA: Diagnosis not present

## 2019-02-23 DIAGNOSIS — H43813 Vitreous degeneration, bilateral: Secondary | ICD-10-CM | POA: Diagnosis not present

## 2019-02-23 DIAGNOSIS — H0100A Unspecified blepharitis right eye, upper and lower eyelids: Secondary | ICD-10-CM | POA: Diagnosis not present

## 2019-02-26 LAB — PAIN MGMT, PROFILE 8 W/CONF, U
6 Acetylmorphine: NEGATIVE ng/mL (ref <10)
ALPHAHYDROXYTRIAZOLAM: NEGATIVE ng/mL (ref <50)
AMPHETAMINES: NEGATIVE ng/mL (ref <500)
Alcohol Metabolites: POSITIVE ng/mL — AB (ref <500)
Alphahydroxyalprazolam: NEGATIVE ng/mL (ref <25)
Alphahydroxymidazolam: NEGATIVE ng/mL (ref <50)
Aminoclonazepam: 51 ng/mL — ABNORMAL HIGH (ref <25)
Benzodiazepines: POSITIVE ng/mL — AB (ref <100)
Buprenorphine, Urine: NEGATIVE ng/mL (ref <5)
Cocaine Metabolite: NEGATIVE ng/mL (ref <150)
Creatinine: 81.6 mg/dL
Ethyl Glucuronide (ETG): 1533 ng/mL — ABNORMAL HIGH (ref <500)
Ethyl Sulfate (ETS): 226 ng/mL — ABNORMAL HIGH (ref <100)
Hydroxyethylflurazepam: NEGATIVE ng/mL (ref <50)
Lorazepam: NEGATIVE ng/mL (ref <50)
MDMA: NEGATIVE ng/mL (ref <500)
Marijuana Metabolite: NEGATIVE ng/mL (ref <20)
Nordiazepam: NEGATIVE ng/mL (ref <50)
OXAZEPAM: 173 ng/mL — AB (ref <50)
Opiates: NEGATIVE ng/mL (ref <100)
Oxidant: NEGATIVE ug/mL (ref <200)
Oxycodone: NEGATIVE ng/mL (ref <100)
Temazepam: 72 ng/mL — ABNORMAL HIGH (ref <50)
pH: 6.8 (ref 4.5–9.0)

## 2019-06-02 ENCOUNTER — Other Ambulatory Visit: Payer: Self-pay | Admitting: Family Medicine

## 2019-06-02 NOTE — Telephone Encounter (Signed)
Requesting:valium  Contract:yes UDS:low risk next screen 08/29/19 Last OV:02/19/19 Next OV:n/a Last Refill:02/19/19  #30-0rf Database:   Please advise

## 2019-06-03 MED FILL — DIAZEPAM 10 MG TABS: 10 | 30 days supply | Qty: 30 | Fill #0

## 2019-08-03 DIAGNOSIS — R972 Elevated prostate specific antigen [PSA]: Secondary | ICD-10-CM | POA: Diagnosis not present

## 2019-08-05 ENCOUNTER — Other Ambulatory Visit: Payer: Self-pay | Admitting: Family Medicine

## 2019-08-06 MED FILL — DIAZEPAM 10 MG TABS: 10 | 30 days supply | Qty: 30 | Fill #0

## 2019-08-06 NOTE — Telephone Encounter (Signed)
Requesting: diazepam Contract:   UDS: 02/19/19 Last OV:  Next OV: 02/19/19 Last Refill: 06/02/19 Database:   Please advise

## 2019-08-12 DIAGNOSIS — N529 Male erectile dysfunction, unspecified: Secondary | ICD-10-CM | POA: Diagnosis not present

## 2019-08-12 DIAGNOSIS — N138 Other obstructive and reflux uropathy: Secondary | ICD-10-CM | POA: Diagnosis not present

## 2019-08-12 DIAGNOSIS — N401 Enlarged prostate with lower urinary tract symptoms: Secondary | ICD-10-CM | POA: Diagnosis not present

## 2019-08-12 DIAGNOSIS — R972 Elevated prostate specific antigen [PSA]: Secondary | ICD-10-CM | POA: Diagnosis not present

## 2019-08-14 ENCOUNTER — Other Ambulatory Visit: Payer: Self-pay

## 2019-08-20 ENCOUNTER — Other Ambulatory Visit (INDEPENDENT_AMBULATORY_CARE_PROVIDER_SITE_OTHER): Payer: Medicare Other

## 2019-08-20 ENCOUNTER — Other Ambulatory Visit: Payer: Self-pay

## 2019-08-20 DIAGNOSIS — E782 Mixed hyperlipidemia: Secondary | ICD-10-CM | POA: Diagnosis not present

## 2019-08-20 DIAGNOSIS — R739 Hyperglycemia, unspecified: Secondary | ICD-10-CM

## 2019-08-20 LAB — LIPID PANEL
Cholesterol: 207 mg/dL — ABNORMAL HIGH (ref 0–200)
HDL: 58 mg/dL (ref >39.00)
LDL Cholesterol: 131 mg/dL — ABNORMAL HIGH (ref 0–99)
NonHDL: 148.62
Total CHOL/HDL Ratio: 4
Triglycerides: 86 mg/dL (ref 0.0–149.0)
VLDL: 17.2 mg/dL (ref 0.0–40.0)

## 2019-08-20 LAB — COMPREHENSIVE METABOLIC PANEL
ALT: 21 U/L (ref 0–53)
AST: 19 U/L (ref 0–37)
Albumin: 4.1 g/dL (ref 3.5–5.2)
Alkaline Phosphatase: 82 U/L (ref 39–117)
BUN: 23 mg/dL (ref 6–23)
CO2: 28 mEq/L (ref 19–32)
Calcium: 9 mg/dL (ref 8.4–10.5)
Chloride: 109 mEq/L (ref 96–112)
Creatinine, Ser: 0.82 mg/dL (ref 0.40–1.50)
GFR: 92.02 mL/min (ref >60.00)
Glucose, Bld: 112 mg/dL — ABNORMAL HIGH (ref 70–99)
Potassium: 4.2 mEq/L (ref 3.5–5.1)
Sodium: 142 mEq/L (ref 135–145)
Total Bilirubin: 0.4 mg/dL (ref 0.2–1.2)
Total Protein: 6.4 g/dL (ref 6.0–8.3)

## 2019-08-20 LAB — HEMOGLOBIN A1C: Hgb A1c MFr Bld: 5.9 % (ref 4.6–6.5)

## 2019-08-24 ENCOUNTER — Ambulatory Visit (HOSPITAL_BASED_OUTPATIENT_CLINIC_OR_DEPARTMENT_OTHER)
Admission: RE | Admit: 2019-08-24 | Discharge: 2019-08-24 | Disposition: A | Discharge status: Home / Self Care | Payer: Medicare Other | Source: Ambulatory Visit | Attending: Family Medicine | Admitting: Family Medicine

## 2019-08-24 ENCOUNTER — Ambulatory Visit (INDEPENDENT_AMBULATORY_CARE_PROVIDER_SITE_OTHER): Payer: Medicare Other | Admitting: Family Medicine

## 2019-08-24 ENCOUNTER — Other Ambulatory Visit: Payer: Self-pay

## 2019-08-24 ENCOUNTER — Encounter: Payer: Self-pay | Admitting: Family Medicine

## 2019-08-24 VITALS — BP 124/80 | HR 64 | Temp 97.5°F | Resp 18 | Wt 179.0 lb

## 2019-08-24 DIAGNOSIS — Z23 Encounter for immunization: Secondary | ICD-10-CM

## 2019-08-24 DIAGNOSIS — E782 Mixed hyperlipidemia: Secondary | ICD-10-CM | POA: Diagnosis not present

## 2019-08-24 DIAGNOSIS — R972 Elevated prostate specific antigen [PSA]: Secondary | ICD-10-CM

## 2019-08-24 DIAGNOSIS — M542 Cervicalgia: Secondary | ICD-10-CM | POA: Diagnosis not present

## 2019-08-24 DIAGNOSIS — R739 Hyperglycemia, unspecified: Secondary | ICD-10-CM | POA: Diagnosis not present

## 2019-08-24 DIAGNOSIS — M79641 Pain in right hand: Secondary | ICD-10-CM | POA: Diagnosis not present

## 2019-08-24 DIAGNOSIS — M79642 Pain in left hand: Secondary | ICD-10-CM | POA: Diagnosis not present

## 2019-08-24 DIAGNOSIS — E559 Vitamin D deficiency, unspecified: Secondary | ICD-10-CM | POA: Diagnosis not present

## 2019-08-24 MED ORDER — CYCLOBENZAPRINE HCL 10 MG PO TABS: 10.0000 mg | ORAL_TABLET | Freq: Three times a day (TID) | ORAL | 0 refills | Status: DC | PRN

## 2019-08-24 MED ORDER — DIAZEPAM 10 MG PO TABS: 10.0000 mg | ORAL_TABLET | Freq: Every day | ORAL | 1 refills | Status: DC | PRN

## 2019-08-24 NOTE — Assessment & Plan Note (Signed)
Follows with Dr Felipa Eth.

## 2019-08-24 NOTE — Assessment & Plan Note (Addendum)
Encouraged moist heat and gentle stretching as tolerated. May try NSAIDs and prescription meds as directed and report if symptoms worsen or seek immediate care. Check an xray and notify us if worsens and ready for a referral. May use Aleve and Flexeril prn.

## 2019-08-24 NOTE — Progress Notes (Signed)
Subjective:    Patient ID: Christopher Haynes, male    DOB: 04/08/1946, 73 y.o.   MRN: [id number]  Chief Complaint  Patient presents with  . Follow-up    HPI Patient is in today for follow up on chronic medical concerns including hyperglycemia, hyperlipidemia, vitamin D deficiency and more. He is noting a significant amount of neck pain and shoulder pain with all of his stress. No recent fall or trauma. Also notes stiffness and pain in his hands. He is maintaining quarantine well, trying to stay active and maintain a heart healthy diet. Denies CP/palp/SOB/HA/congestion/fevers/GI or GU c/o. Taking meds as prescribed  Past Medical History:  Diagnosis Date  . Anxiety 08/05/2017  . Back pain 08/20/2016  . Benign prostatic hyperplasia (BPH) with urinary urgency 02/09/2016  . Constipation 02/20/2016  . Contusion 02/20/2016  . Elevated PSA   . H/O measles   . Headache   . History of colonic polyps 02/09/2016   [facility] roughly 2 years ago. Now on 5 year plan  . Hyperglycemia 08/20/2016  . Hyperlipidemia, mixed 02/08/2017  . Macular degeneration of left eye 02/19/2016  . Migraine with visual aura 02/09/2016  . Neck pain 08/05/2017  . OSA on CPAP   . Pain in joint, shoulder region 02/19/2016  . Peripheral neuropathy 08/05/2017  . Thyroid disease    thyroiditis, h/o in 57s    Past Surgical History:  Procedure Laterality Date  . ANKLE FRACTURE SURGERY  66  . KNEE ARTHROSCOPY WITH SUBCHONDROPLASTY Left 12/13/2017   Procedure: KNEE ARTHROSCOPY WITH SUBCHONDROPLASTY;  Surgeon: Marcene Corning, MD;  Location: [facility];  Service: Orthopedics;  Laterality: Left;  . SKIN GRAFT  Age 46  . TONSILLECTOMY  Age 68    Family History  Problem Relation Age of Onset  . Hypertension Mother   . Diabetes Mother   . Colon cancer Father   . Cancer Father        rectal with mets  . Hypertension Brother   . Obesity Son   . Other Son        fatty liver  . Allergic  Disorder Son        seasonal  . Stroke Paternal Grandfather     Social History   Socioeconomic History  . Marital status: Married    Spouse name: Not on file  . Number of children: Not on file  . Years of education: Not on file  . Highest education level: Not on file  Occupational History  . Occupation: Customer service manager  Social Needs  . Financial resource strain: Not on file  . Food insecurity    Worry: Not on file    Inability: Not on file  . Transportation needs    Medical: Not on file    Non-medical: Not on file  Tobacco Use  . Smoking status: Never Smoker  . Smokeless tobacco: Never Used  Substance and Sexual Activity  . Alcohol use: Yes    Alcohol/week: 0.0 standard drinks  . Drug use: No  . Sexual activity: Yes    Comment: lives with wife, work in Aud[employer] estate, no dietary restrictions  Lifestyle  . Physical activity    Days per week: Not on file    Minutes per session: Not on file  . Stress: Not on file  Relationships  . Social Musician on phone: Not on file    Gets together: Not on file    Attends  religious service: Not on file    Active member of club or organization: Not on file    Attends meetings of clubs or organizations: Not on file    Relationship status: Not on file  . Intimate partner violence    Fear of current or ex partner: Not on file    Emotionally abused: Not on file    Physically abused: Not on file    Forced sexual activity: Not on file  Other Topics Concern  . Not on file  Social History Narrative  . Not on file    Outpatient Medications Prior to Visit  Medication Sig Dispense Refill  . diclofenac sodium (VOLTAREN) 1 % GEL Apply 2 g topically 4 (four) times daily.    . Omega-3 Fatty Acids (FISH OIL PO) Take by mouth daily.    . tamsulosin (FLOMAX) 0.4 MG CAPS capsule Take 0.4 mg by mouth.    . traMADol (ULTRAM) 50 MG tablet Take 1 tablet (50 mg total) by mouth daily as needed. 30 tablet 0  . diazepam (VALIUM) 10 MG  tablet TAKE 1 TABLET (10 MG TOTAL) BY MOUTH DAILY AS NEEDED. 30 tablet 0   No facility-administered medications prior to visit.     Allergies  Allergen Reactions  . Aspirin Hives    Patient states that takes Aspirin regularly without issues. Per primary at [facility] recommends no asa with hives because can exacerbate hives.    Review of Systems  Constitutional: Negative for fever and malaise/fatigue.  HENT: Negative for congestion.   Eyes: Negative for blurred vision.  Respiratory: Negative for shortness of breath.   Cardiovascular: Negative for chest pain, palpitations and leg swelling.  Gastrointestinal: Negative for abdominal pain, blood in stool and nausea.  Genitourinary: Negative for dysuria and frequency.  Musculoskeletal: Positive for joint pain and neck pain. Negative for falls.  Skin: Negative for rash.  Neurological: Negative for dizziness, loss of consciousness and headaches.  Endo/Heme/Allergies: Negative for environmental allergies.  Psychiatric/Behavioral: Negative for depression. The patient is not nervous/anxious.        Objective:    Physical Exam Vitals signs and nursing note reviewed.  Constitutional:      General: He is not in acute distress.    Appearance: He is well-developed.  HENT:     Head: Normocephalic and atraumatic.     Nose: Nose normal.  Eyes:     General:        Right eye: No discharge.        Left eye: No discharge.  Neck:     Musculoskeletal: Normal range of motion and neck supple.  Cardiovascular:     Rate and Rhythm: Normal rate and regular rhythm.     Heart sounds: No murmur.  Pulmonary:     Effort: Pulmonary effort is normal.     Breath sounds: Normal breath sounds.  Abdominal:     General: Bowel sounds are normal.     Palpations: Abdomen is soft.     Tenderness: There is no abdominal tenderness.  Skin:    General: Skin is warm and dry.  Neurological:     Mental Status: He is alert and oriented to person, place, and time.      BP 124/80 (BP Location: Left Arm, Patient Position: Sitting, Cuff Size: Normal)   Pulse 64   Temp (!) 97.5 F (36.4 C) (Oral)   Resp 18   Wt 179 lb (81.2 kg)   SpO2 98%   BMI 30.73 kg/m  Wt  Readings from Last 3 Encounters:  08/24/19 179 lb (81.2 kg)  02/19/19 187 lb 6.4 oz (85 kg)  08/14/18 176 lb 3.2 oz (79.9 kg)    Diabetic Foot Exam - Simple   No data filed     Lab Results  Component Value Date   WBC 4.5 02/16/2019   HGB 14.2 02/16/2019   HCT 42.1 02/16/2019   PLT 246.0 02/16/2019   GLUCOSE 112 (H) 08/20/2019   CHOL 207 (H) 08/20/2019   TRIG 86.0 08/20/2019   HDL 58.00 08/20/2019   LDLCALC 131 (H) 08/20/2019   ALT 21 08/20/2019   AST 19 08/20/2019   NA 142 08/20/2019   K 4.2 08/20/2019   CL 109 08/20/2019   CREATININE 0.82 08/20/2019   BUN 23 08/20/2019   CO2 28 08/20/2019   TSH 2.90 02/16/2019   PSA 7.85 (H) 02/08/2017   HGBA1C 5.9 08/20/2019    Lab Results  Component Value Date   TSH 2.90 02/16/2019   Lab Results  Component Value Date   WBC 4.5 02/16/2019   HGB 14.2 02/16/2019   HCT 42.1 02/16/2019   MCV 89.4 02/16/2019   PLT 246.0 02/16/2019   Lab Results  Component Value Date   NA 142 08/20/2019   K 4.2 08/20/2019   CO2 28 08/20/2019   GLUCOSE 112 (H) 08/20/2019   BUN 23 08/20/2019   CREATININE 0.82 08/20/2019   BILITOT 0.4 08/20/2019   ALKPHOS 82 08/20/2019   AST 19 08/20/2019   ALT 21 08/20/2019   PROT 6.4 08/20/2019   ALBUMIN 4.1 08/20/2019   CALCIUM 9.0 08/20/2019   GFR 92.02 08/20/2019   Lab Results  Component Value Date   CHOL 207 (H) 08/20/2019   Lab Results  Component Value Date   HDL 58.00 08/20/2019   Lab Results  Component Value Date   LDLCALC 131 (H) 08/20/2019   Lab Results  Component Value Date   TRIG 86.0 08/20/2019   Lab Results  Component Value Date   CHOLHDL 4 08/20/2019   Lab Results  Component Value Date   HGBA1C 5.9 08/20/2019       Assessment & Plan:   Problem List Items Addressed This  Visit    Elevated PSA    Follows with Dr Pete GlatterStoneKing.       Hyperglycemia    hgba1c acceptable, minimize simple carbs. Increase exercise as tolerated.       Hyperlipidemia, mixed    Encouraged heart healthy diet, increase exercise, avoid trans fats, consider a krill oil cap daily      Neck pain - Primary    Encouraged moist heat and gentle stretching as tolerated. May try NSAIDs and prescription meds as directed and report if symptoms worsen or seek immediate care. Check an xray and notify us if worsens and ready for a referral. May use Aleve and Flexeril prn.       Relevant Orders   DG Cervical Spine Complete (Completed)   Vitamin D deficiency    Supplement and monitor       Other Visit Diagnoses    Pain in both hands       Relevant Orders   Rheumatoid Factor   Needs flu shot       Relevant Orders   Flu Vaccine QUAD High Dose(Fluad) (Completed)      I am having Christopher Haynes start on cyclobenzaprine. I am also having him maintain his Omega-3 Fatty Acids (FISH OIL PO), traMADol, tamsulosin, diclofenac sodium, and diazepam.  Meds  ordered this encounter  Medications  . cyclobenzaprine (FLEXERIL) 10 MG tablet    Sig: Take 1 tablet (10 mg total) by mouth 3 (three) times daily as needed for muscle spasms.    Dispense:  30 tablet    Refill:  0  . diazepam (VALIUM) 10 MG tablet    Sig: Take 1 tablet (10 mg total) by mouth daily as needed.    Dispense:  30 tablet    Refill:  1     Danise EdgeStacey Blyth, MD

## 2019-08-24 NOTE — Assessment & Plan Note (Signed)
Supplement and monitor 

## 2019-08-24 NOTE — Assessment & Plan Note (Signed)
hgba1c acceptable, minimize simple carbs. Increase exercise as tolerated.  

## 2019-08-24 NOTE — Patient Instructions (Addendum)
Pulse oximeter on [employer]  Weekly vitals    Arthritis Arthritis is a term that is commonly used to refer to joint pain or joint disease. There are more than 100 types of arthritis. What are the causes? The most common cause of this condition is wear and tear of a joint. Other causes include:  Gout.  Inflammation of a joint.  An infection of a joint.  Sprains and other injuries near the joint.  A reaction to medicines or drugs, or an allergic reaction. In some cases, the cause may not be known. What are the signs or symptoms? The main symptom of this condition is pain in the joint during movement. Other symptoms include:  Redness, swelling, or stiffness at a joint.  Warmth coming from the joint.  Fever.  Overall feeling of illness. How is this diagnosed? This condition may be diagnosed with a physical exam and tests, including:  Blood tests.  Urine tests.  Imaging tests, such as X-rays, an MRI, or a CT scan. Sometimes, fluid is removed from a joint for testing. How is this treated? This condition may be treated with:  Treatment of the cause, if it is known.  Rest.  Raising (elevating) the joint.  Applying cold or hot packs to the joint.  Medicines to improve symptoms and reduce inflammation.  Injections of a steroid such as cortisone into the joint to help reduce pain and inflammation. Depending on the cause of your arthritis, you may need to make lifestyle changes to reduce stress on your joint. Changes may include:  Exercising more.  Losing weight. Follow these instructions at home: Medicines  Take over-the-counter and prescription medicines only as told by your health care provider.  Do not take aspirin to relieve pain if your health care provider thinks that gout may be causing your pain. Activity  Rest your joint if told by your health care provider. Rest is important when your disease is active and your joint feels painful, swollen, or stiff.   Avoid activities that make the pain worse. It is important to balance activity with rest.  Exercise your joint regularly with range-of-motion exercises as told by your health care provider. Try doing low-impact exercise, such as: ? Swimming. ? Water aerobics. ? Biking. ? Walking. Managing pain, stiffness, and swelling      If directed, put ice on the joint. ? Put ice in a plastic bag. ? Place a towel between your skin and the bag. ? Leave the ice on for 20 minutes, 2-3 times per day.  If your joint is swollen, raise (elevate) it above the level of your heart if directed by your health care provider.  If your joint feels stiff in the morning, try taking a warm shower.  If directed, apply heat to the affected area as often as told by your health care provider. Use the heat source that your health care provider recommends, such as a moist heat pack or a heating pad. If you have diabetes, do not apply heat without permission from your health care provider. To apply heat: ? Place a towel between your skin and the heat source. ? Leave the heat on for 20-30 minutes. ? Remove the heat if your skin turns bright red. This is especially important if you are unable to feel pain, heat, or cold. You may have a greater risk of getting burned. General instructions  Do not use any products that contain nicotine or tobacco, such as cigarettes, e-cigarettes, and chewing tobacco. If you  need help quitting, ask your health care provider.  Keep all follow-up visits as told by your health care provider. This is important. Contact a health care provider if:  The pain gets worse.  You have a fever. Get help right away if:  You develop severe joint pain, swelling, or redness.  Many joints become painful and swollen.  You develop severe back pain.  You develop severe weakness in your leg.  You cannot control your bladder or bowels. Summary  Arthritis is a term that is commonly used to refer to  joint pain or joint disease. There are more than 100 types of arthritis.  The most common cause of this condition is wear and tear of a joint. Other causes include gout, inflammation or infection of the joint, sprains, or allergies.  Symptoms of this condition include redness, swelling, or stiffness of the joint. Other symptoms include warmth, fever, or feeling ill.  This condition is treated with rest, elevation, medicines, and applying cold or hot packs.  Follow your health care provider's instructions about medicines, activity, exercises, and other home care treatments. This information is not intended to replace advice given to you by your health care provider. Make sure you discuss any questions you have with your health care provider. Document Released: 01/17/2005 Document Revised: 11/17/2018 Document Reviewed: 11/17/2018 Elsevier Patient Education  2020 [employer].

## 2019-08-24 NOTE — Assessment & Plan Note (Signed)
Encouraged heart healthy diet, increase exercise, avoid trans fats, consider a krill oil cap daily 

## 2019-08-25 LAB — RHEUMATOID FACTOR: Rheumatoid fact SerPl-aCnc: 14 IU/mL — ABNORMAL HIGH (ref <14)

## 2019-09-18 MED FILL — DIAZEPAM 10 MG TABS: 10 | 30 days supply | Qty: 30 | Fill #0

## 2019-10-09 ENCOUNTER — Telehealth: Payer: Self-pay | Admitting: Family Medicine

## 2019-10-09 NOTE — Telephone Encounter (Signed)
Called patient to schedule AWV, but no answer. Will try to call patient back at later time. SF °

## 2019-10-29 DIAGNOSIS — H52223 Regular astigmatism, bilateral: Secondary | ICD-10-CM | POA: Diagnosis not present

## 2019-10-30 MED FILL — DIAZEPAM 10 MG TABS: 10 | 30 days supply | Qty: 30 | Fill #1

## 2020-01-11 ENCOUNTER — Other Ambulatory Visit: Payer: Self-pay | Admitting: Family Medicine

## 2020-01-12 MED FILL — DIAZEPAM 10 MG TABS: 10 | 30 days supply | Qty: 30 | Fill #0

## 2020-01-12 NOTE — Telephone Encounter (Signed)
Requesting:valium  Contract:yes UDS: low risk next screen 08/20/19 Last OV:10/09/19  Next OV:02/22/20 Last Refill:08/24/19  #[id number] Database:   Please advise

## 2020-01-15 ENCOUNTER — Ambulatory Visit: Payer: Medicare Other | Attending: Internal Medicine

## 2020-01-15 DIAGNOSIS — Z23 Encounter for immunization: Secondary | ICD-10-CM | POA: Insufficient documentation

## 2020-01-15 NOTE — Progress Notes (Signed)
   Covid-19 Vaccination Clinic  Name:  Logyn Kendrick    MRN: [id number] DOB: 1946/01/30  01/15/2020  Mr. Manke was observed post Covid-19 immunization for 15 minutes without incidence. He was provided with Vaccine Information Sheet and instruction to access the V-Safe system.   Mr. Toya was instructed to call 911 with any severe reactions post vaccine: Marland Kitchen Difficulty breathing  . Swelling of your face and throat  . A fast heartbeat  . A bad rash all over your body  . Dizziness and weakness    Immunizations Administered    Name Date Dose VIS Date Route   Pfizer COVID-19 Vaccine 01/15/2020  3:52 PM 0.3 mL 12/04/2019 Intramuscular   Manufacturer: [employer], [employer]   Lot: [id number]   NDC: [address]-7

## 2020-02-05 ENCOUNTER — Ambulatory Visit: Payer: Medicare Other | Attending: Internal Medicine

## 2020-02-05 DIAGNOSIS — Z23 Encounter for immunization: Secondary | ICD-10-CM | POA: Insufficient documentation

## 2020-02-05 NOTE — Progress Notes (Signed)
   Covid-19 Vaccination Clinic  Name:  Christopher Haynes    MRN: [id number] DOB: May 18, 1946  02/05/2020  Mr. Jasmin was observed post Covid-19 immunization for 15 minutes without incidence. He was provided with Vaccine Information Sheet and instruction to access the V-Safe system.   Mr. Niblack was instructed to call 911 with any severe reactions post vaccine: Marland Kitchen Difficulty breathing  . Swelling of your face and throat  . A fast heartbeat  . A bad rash all over your body  . Dizziness and weakness    Immunizations Administered    Name Date Dose VIS Date Route   Pfizer COVID-19 Vaccine 02/05/2020 11:10 AM 0.3 mL 12/04/2019 Intramuscular   Manufacturer: [employer], [employer]   Lot: [id number]   NDC: [address]-5

## 2020-02-08 DIAGNOSIS — R972 Elevated prostate specific antigen [PSA]: Secondary | ICD-10-CM | POA: Diagnosis not present

## 2020-02-18 MED FILL — DIAZEPAM 10 MG TABS: 10 | 30 days supply | Qty: 30 | Fill #1

## 2020-02-19 ENCOUNTER — Other Ambulatory Visit: Payer: Self-pay

## 2020-02-19 DIAGNOSIS — R972 Elevated prostate specific antigen [PSA]: Secondary | ICD-10-CM | POA: Diagnosis not present

## 2020-02-19 DIAGNOSIS — N138 Other obstructive and reflux uropathy: Secondary | ICD-10-CM | POA: Diagnosis not present

## 2020-02-19 DIAGNOSIS — N401 Enlarged prostate with lower urinary tract symptoms: Secondary | ICD-10-CM | POA: Diagnosis not present

## 2020-02-22 ENCOUNTER — Ambulatory Visit (INDEPENDENT_AMBULATORY_CARE_PROVIDER_SITE_OTHER): Payer: Medicare Other | Admitting: Family Medicine

## 2020-02-22 ENCOUNTER — Encounter: Payer: Self-pay | Admitting: Family Medicine

## 2020-02-22 ENCOUNTER — Other Ambulatory Visit: Payer: Self-pay

## 2020-02-22 DIAGNOSIS — N401 Enlarged prostate with lower urinary tract symptoms: Secondary | ICD-10-CM

## 2020-02-22 DIAGNOSIS — M25561 Pain in right knee: Secondary | ICD-10-CM

## 2020-02-22 DIAGNOSIS — G4733 Obstructive sleep apnea (adult) (pediatric): Secondary | ICD-10-CM

## 2020-02-22 DIAGNOSIS — E559 Vitamin D deficiency, unspecified: Secondary | ICD-10-CM | POA: Diagnosis not present

## 2020-02-22 DIAGNOSIS — R739 Hyperglycemia, unspecified: Secondary | ICD-10-CM | POA: Diagnosis not present

## 2020-02-22 DIAGNOSIS — E669 Obesity, unspecified: Secondary | ICD-10-CM

## 2020-02-22 DIAGNOSIS — E782 Mixed hyperlipidemia: Secondary | ICD-10-CM | POA: Diagnosis not present

## 2020-02-22 DIAGNOSIS — G44209 Tension-type headache, unspecified, not intractable: Secondary | ICD-10-CM

## 2020-02-22 DIAGNOSIS — R3915 Urgency of urination: Secondary | ICD-10-CM

## 2020-02-22 DIAGNOSIS — M542 Cervicalgia: Secondary | ICD-10-CM

## 2020-02-22 LAB — LIPID PANEL
Cholesterol: 233 mg/dL — ABNORMAL HIGH (ref 0–200)
HDL: 51.7 mg/dL (ref >39.00)
LDL Cholesterol: 160 mg/dL — ABNORMAL HIGH (ref 0–99)
NonHDL: 181.65
Total CHOL/HDL Ratio: 5
Triglycerides: 108 mg/dL (ref 0.0–149.0)
VLDL: 21.6 mg/dL (ref 0.0–40.0)

## 2020-02-22 LAB — CBC
HCT: 43.3 % (ref 39.0–52.0)
Hemoglobin: 14.5 g/dL (ref 13.0–17.0)
MCHC: 33.6 g/dL (ref 30.0–36.0)
MCV: 90.1 fl (ref 78.0–100.0)
Platelets: 226 10*3/uL (ref 150.0–400.0)
RBC: 4.8 Mil/uL (ref 4.22–5.81)
RDW: 13.4 % (ref 11.5–15.5)
WBC: 4.8 10*3/uL (ref 4.0–10.5)

## 2020-02-22 LAB — COMPREHENSIVE METABOLIC PANEL
ALT: 25 U/L (ref 0–53)
AST: 20 U/L (ref 0–37)
Albumin: 4.1 g/dL (ref 3.5–5.2)
Alkaline Phosphatase: 92 U/L (ref 39–117)
BUN: 17 mg/dL (ref 6–23)
CO2: 29 mEq/L (ref 19–32)
Calcium: 9.7 mg/dL (ref 8.4–10.5)
Chloride: 105 mEq/L (ref 96–112)
Creatinine, Ser: 0.86 mg/dL (ref 0.40–1.50)
GFR: 86.98 mL/min (ref >60.00)
Glucose, Bld: 101 mg/dL — ABNORMAL HIGH (ref 70–99)
Potassium: 4.6 mEq/L (ref 3.5–5.1)
Sodium: 141 mEq/L (ref 135–145)
Total Bilirubin: 0.6 mg/dL (ref 0.2–1.2)
Total Protein: 6.6 g/dL (ref 6.0–8.3)

## 2020-02-22 LAB — TSH: TSH: 2.17 u[IU]/mL (ref 0.35–4.50)

## 2020-02-22 LAB — HEMOGLOBIN A1C: Hgb A1c MFr Bld: 5.8 % (ref 4.6–6.5)

## 2020-02-22 LAB — VITAMIN D 25 HYDROXY (VIT D DEFICIENCY, FRACTURES): VITD: 65 ng/mL (ref 30.00–100.00)

## 2020-02-22 NOTE — Assessment & Plan Note (Signed)
Using CPAP 

## 2020-02-22 NOTE — Assessment & Plan Note (Signed)
hgba1c acceptable, minimize simple carbs. Increase exercise as tolerated.  

## 2020-02-22 NOTE — Assessment & Plan Note (Signed)
Encouraged heart healthy diet, increase exercise, avoid trans fats, consider a krill oil cap daily 

## 2020-02-22 NOTE — Assessment & Plan Note (Signed)
Gets diclofenac gel from the [place] and that helps.

## 2020-02-22 NOTE — Patient Instructions (Signed)
[employer] Blood Pressure cuff, upper arm, want BP 100-140/60-90 Pulse oximeter, want oxygen in 90s  Weekly vitals  Take Multivitamin with minerals, selenium Vitamin D 1000-2000 IU daily Probiotic with lactobacillus and bifidophilus Asprin EC 81 mg daily  Melatonin 2-5 mg at bedtime  [email or web address] 

## 2020-02-22 NOTE — Progress Notes (Signed)
Subjective:    Patient ID: Christopher Haynes, male    DOB: Apr 08, 1946, 74 y.o.   MRN: [id number]  Chief Complaint  Patient presents with  . Hyperlipidemia    here for follow up  . Hyperglycemia    here for follow up  . Arm Pain    complains for left arm pain from hand up to neck     HPI Patient is in today for follow up on chronic medical concerns. No recent febrile illness or hospitalizations. Is maintaining quarantine well. Has had both Pfizer shots and tolerated them well. Denies CP/palp/SOB/HA/congestion/fevers/GI or GU c/o. Taking meds as prescribed  Past Medical History:  Diagnosis Date  . Anxiety 08/05/2017  . Back pain 08/20/2016  . Benign prostatic hyperplasia (BPH) with urinary urgency 02/09/2016  . Constipation 02/20/2016  . Contusion 02/20/2016  . Elevated PSA   . H/O measles   . Headache   . History of colonic polyps 02/09/2016   [facility]. Now on 5 year plan  . Hyperglycemia 08/20/2016  . Hyperlipidemia, mixed 02/08/2017  . Macular degeneration of left eye 02/19/2016  . Migraine with visual aura 02/09/2016  . Neck pain 08/05/2017  . OSA on CPAP   . Pain in joint, shoulder region 02/19/2016  . Peripheral neuropathy 08/05/2017  . Thyroid disease    thyroiditis, h/o in 23s    Past Surgical History:  Procedure Laterality Date  . ANKLE FRACTURE SURGERY  66  . KNEE ARTHROSCOPY WITH SUBCHONDROPLASTY Left 12/13/2017   Procedure: KNEE ARTHROSCOPY WITH SUBCHONDROPLASTY;  Surgeon: Marcene Corning, MD;  Location: [facility];  Service: Orthopedics;  Laterality: Left;  . SKIN GRAFT  Age 58  . TONSILLECTOMY  Age 85    Family History  Problem Relation Age of Onset  . Hypertension Mother   . Diabetes Mother   . Colon cancer Father   . Cancer Father        rectal with mets  . Hypertension Brother   . Obesity Son   . Other Son        fatty liver  . Allergic Disorder Son        seasonal  . Stroke Paternal  Grandfather     Social History   Socioeconomic History  . Marital status: Married    Spouse name: Not on file  . Number of children: Not on file  . Years of education: Not on file  . Highest education level: Not on file  Occupational History  . Occupation: Customer service manager  Tobacco Use  . Smoking status: Never Smoker  . Smokeless tobacco: Never Used  Substance and Sexual Activity  . Alcohol use: Yes    Alcohol/week: 0.0 standard drinks  . Drug use: No  . Sexual activity: Yes    Comment: lives with wife, work in Audiological scientist estate, no dietary restrictions  Other Topics Concern  . Not on file  Social History Narrative  . Not on file   Social Determinants of Health   Financial Resource Strain:   . Difficulty of Paying Living Expenses: Not on file  Food Insecurity:   . Worried About Programme researcher, broadcasting/film/video in the Last Year: Not on file  . Ran Out of Food in the Last Year: Not on file  Transportation Needs:   . Lack of Transportation (Medical): Not on file  . Lack of Transportation (Non-Medical): Not on file  Physical Activity:   . Days of Exercise  per Week: Not on file  . Minutes of Exercise per Session: Not on file  Stress:   . Feeling of Stress : Not on file  Social Connections:   . Frequency of Communication with Friends and Family: Not on file  . Frequency of Social Gatherings with Friends and Family: Not on file  . Attends Religious Services: Not on file  . Active Member of Clubs or Organizations: Not on file  . Attends Archivist Meetings: Not on file  . Marital Status: Not on file  Intimate Partner Violence:   . Fear of Current or Ex-Partner: Not on file  . Emotionally Abused: Not on file  . Physically Abused: Not on file  . Sexually Abused: Not on file    Outpatient Medications Prior to Visit  Medication Sig Dispense Refill  . cyclobenzaprine (FLEXERIL) 10 MG tablet Take 1 tablet (10 mg total) by mouth 3 (three) times daily as needed for muscle spasms. 30  tablet 0  . diazepam (VALIUM) 10 MG tablet TAKE 1 TABLET (10 MG TOTAL) BY MOUTH DAILY AS NEEDED. 30 tablet 1  . diclofenac sodium (VOLTAREN) 1 % GEL Apply 2 g topically 4 (four) times daily.    . Omega-3 Fatty Acids (FISH OIL PO) Take by mouth daily.    . tamsulosin (FLOMAX) 0.4 MG CAPS capsule Take 0.4 mg by mouth.    . traMADol (ULTRAM) 50 MG tablet Take 1 tablet (50 mg total) by mouth daily as needed. 30 tablet 0   No facility-administered medications prior to visit.    Allergies  Allergen Reactions  . Aspirin Hives    Patient states that takes Aspirin regularly without issues. Per [facility] recommends no asa with hives because can exacerbate hives.    Review of Systems  Constitutional: Negative for chills, fever and malaise/fatigue.  HENT: Negative for congestion and hearing loss.   Eyes: Negative for discharge.  Respiratory: Negative for cough, sputum production and shortness of breath.   Cardiovascular: Negative for chest pain, palpitations and leg swelling.  Gastrointestinal: Negative for abdominal pain, blood in stool, constipation, diarrhea, heartburn, nausea and vomiting.  Genitourinary: Negative for dysuria, frequency, hematuria and urgency.  Musculoskeletal: Negative for back pain, falls and myalgias.  Skin: Negative for rash.  Neurological: Negative for dizziness, sensory change, loss of consciousness, weakness and headaches.  Endo/Heme/Allergies: Negative for environmental allergies. Does not bruise/bleed easily.  Psychiatric/Behavioral: Negative for depression and suicidal ideas. The patient is not nervous/anxious and does not have insomnia.        Objective:    Physical Exam Vitals and nursing note reviewed.  Constitutional:      General: He is not in acute distress.    Appearance: He is well-developed.  HENT:     Head: Normocephalic and atraumatic.     Nose: Nose normal.  Eyes:     General:        Right eye: No discharge.        Left eye: No  discharge.  Cardiovascular:     Rate and Rhythm: Normal rate and regular rhythm.     Heart sounds: No murmur.  Pulmonary:     Effort: Pulmonary effort is normal.     Breath sounds: Normal breath sounds.  Abdominal:     General: Bowel sounds are normal.     Palpations: Abdomen is soft.     Tenderness: There is no abdominal tenderness.  Musculoskeletal:     Cervical back: Normal range of motion and  neck supple.  Skin:    General: Skin is warm and dry.  Neurological:     Mental Status: He is alert and oriented to person, place, and time.     BP (!) 149/81 (BP Location: Left Arm, Patient Position: Sitting, Cuff Size: Small)   Pulse 66   Temp (!) 97.2 F (36.2 C) (Temporal)   Resp 16   Ht 5\' 5"  (1.651 m)   Wt 183 lb (83 kg)   SpO2 99%   BMI 30.45 kg/m  Wt Readings from Last 3 Encounters:  02/22/20 183 lb (83 kg)  08/24/19 179 lb (81.2 kg)  02/19/19 187 lb 6.4 oz (85 kg)    Diabetic Foot Exam - Simple   No data filed     Lab Results  Component Value Date   WBC 4.5 02/16/2019   HGB 14.2 02/16/2019   HCT 42.1 02/16/2019   PLT 246.0 02/16/2019   GLUCOSE 112 (H) 08/20/2019   CHOL 207 (H) 08/20/2019   TRIG 86.0 08/20/2019   HDL 58.00 08/20/2019   LDLCALC 131 (H) 08/20/2019   ALT 21 08/20/2019   AST 19 08/20/2019   NA 142 08/20/2019   K 4.2 08/20/2019   CL 109 08/20/2019   CREATININE 0.82 08/20/2019   BUN 23 08/20/2019   CO2 28 08/20/2019   TSH 2.90 02/16/2019   PSA 7.85 (H) 02/08/2017   HGBA1C 5.9 08/20/2019    Lab Results  Component Value Date   TSH 2.90 02/16/2019   Lab Results  Component Value Date   WBC 4.5 02/16/2019   HGB 14.2 02/16/2019   HCT 42.1 02/16/2019   MCV 89.4 02/16/2019   PLT 246.0 02/16/2019   Lab Results  Component Value Date   NA 142 08/20/2019   K 4.2 08/20/2019   CO2 28 08/20/2019   GLUCOSE 112 (H) 08/20/2019   BUN 23 08/20/2019   CREATININE 0.82 08/20/2019   BILITOT 0.4 08/20/2019   ALKPHOS 82 08/20/2019   AST 19  08/20/2019   ALT 21 08/20/2019   PROT 6.4 08/20/2019   ALBUMIN 4.1 08/20/2019   CALCIUM 9.0 08/20/2019   GFR 92.02 08/20/2019   Lab Results  Component Value Date   CHOL 207 (H) 08/20/2019   Lab Results  Component Value Date   HDL 58.00 08/20/2019   Lab Results  Component Value Date   LDLCALC 131 (H) 08/20/2019   Lab Results  Component Value Date   TRIG 86.0 08/20/2019   Lab Results  Component Value Date   CHOLHDL 4 08/20/2019   Lab Results  Component Value Date   HGBA1C 5.9 08/20/2019       Assessment & Plan:   Problem List Items Addressed This Visit    Obstructive sleep apnea    Using CPAP      Benign prostatic hyperplasia (BPH) with urinary urgency    He continues to follow with Dr 08/22/2019. Stable and they are still monitoring      Hyperglycemia    hgba1c acceptable, minimize simple carbs. Increase exercise as tolerated.       Relevant Orders   Hemoglobin A1c   Hyperlipidemia, mixed    Encouraged heart healthy diet, increase exercise, avoid trans fats, consider a krill oil cap daily      Relevant Orders   Comprehensive metabolic panel   Lipid panel   Neck pain    Continues to have trouble with left sided neck pain intermittently. Has some radicular symptoms into upper left arm. He does  note sometimes the muscle pain in neck can spread into head and behind left eye. He can take some Tramadol, muscle relaxer can help but makes him sleepy. Uses Chiropractor at times.       Right knee pain    Gets diclofenac gel from the [place] and that helps.       Vitamin D deficiency    Supplement and monitor      Relevant Orders   VITAMIN D 25 Hydroxy (Vit-D Deficiency, Fractures)   Headache    Encouraged increased hydration, 64 ounces of clear fluids daily. Minimize alcohol and caffeine. Eat small frequent meals with lean proteins and complex carbs. Avoid high and low blood sugars. Get adequate sleep, 7-8 hours a night. Needs exercise daily preferably in the  morning.      Relevant Orders   CBC   TSH   Obesity    Encouraged DASH diet, decrease po intake and increase exercise as tolerated. Needs 7-8 hours of sleep nightly. Avoid trans fats, eat small, frequent meals every 4-5 hours with lean proteins, complex carbs and healthy fats. Minimize simple carbs, consider [employer] APP         I am having Lurline Idol maintain his Omega-3 Fatty Acids (FISH OIL PO), traMADol, tamsulosin, diclofenac sodium, cyclobenzaprine, and diazepam.  No orders of the defined types were placed in this encounter.    Danise Edge, MD

## 2020-02-22 NOTE — Assessment & Plan Note (Signed)
Supplement and monitor 

## 2020-02-22 NOTE — Assessment & Plan Note (Signed)
Encouraged increased hydration, 64 ounces of clear fluids daily. Minimize alcohol and caffeine. Eat small frequent meals with lean proteins and complex carbs. Avoid high and low blood sugars. Get adequate sleep, 7-8 hours a night. Needs exercise daily preferably in the morning.  

## 2020-02-22 NOTE — Assessment & Plan Note (Signed)
Encouraged DASH diet, decrease po intake and increase exercise as tolerated. Needs 7-8 hours of sleep nightly. Avoid trans fats, eat small, frequent meals every 4-5 hours with lean proteins, complex carbs and healthy fats. Minimize simple carbs, consider [employer] APP 

## 2020-02-22 NOTE — Assessment & Plan Note (Addendum)
Continues to have trouble with left sided neck pain intermittently. Has some radicular symptoms into upper left arm. He does note sometimes the muscle pain in neck can spread into head and behind left eye. He can take some Tramadol, muscle relaxer can help but makes him sleepy. Uses Chiropractor at times.

## 2020-02-22 NOTE — Assessment & Plan Note (Signed)
He continues to follow with Dr Pete Glatter. Stable and they are still monitoring

## 2020-03-22 ENCOUNTER — Other Ambulatory Visit: Payer: Self-pay | Admitting: Family Medicine

## 2020-03-23 MED FILL — DIAZEPAM 10 MG TABS: 10 | 30 days supply | Qty: 30 | Fill #0

## 2020-03-23 NOTE — Telephone Encounter (Signed)
Requesting:valium Contract:N/A UDS:01/30/19 Last Visit:02/22/20 Next Visit:08/25/20 Last Refill:01/12/20  Please Advise

## 2020-04-22 MED FILL — DIAZEPAM 10 MG TABS: 10 | 30 days supply | Qty: 30 | Fill #1

## 2020-04-28 ENCOUNTER — Ambulatory Visit (INDEPENDENT_AMBULATORY_CARE_PROVIDER_SITE_OTHER): Payer: Medicare Other | Admitting: Family Medicine

## 2020-04-28 ENCOUNTER — Ambulatory Visit (HOSPITAL_BASED_OUTPATIENT_CLINIC_OR_DEPARTMENT_OTHER)
Admission: RE | Admit: 2020-04-28 | Discharge: 2020-04-28 | Disposition: A | Discharge status: Home / Self Care | Payer: Medicare Other | Source: Ambulatory Visit | Attending: Family Medicine | Admitting: Family Medicine

## 2020-04-28 ENCOUNTER — Other Ambulatory Visit: Payer: Self-pay

## 2020-04-28 VITALS — BP 158/88 | HR 109 | Temp 97.5°F | Resp 12 | Ht 65.0 in | Wt 186.0 lb

## 2020-04-28 DIAGNOSIS — F419 Anxiety disorder, unspecified: Secondary | ICD-10-CM

## 2020-04-28 DIAGNOSIS — E559 Vitamin D deficiency, unspecified: Secondary | ICD-10-CM

## 2020-04-28 DIAGNOSIS — E782 Mixed hyperlipidemia: Secondary | ICD-10-CM | POA: Diagnosis not present

## 2020-04-28 DIAGNOSIS — M542 Cervicalgia: Secondary | ICD-10-CM | POA: Diagnosis not present

## 2020-04-28 DIAGNOSIS — R739 Hyperglycemia, unspecified: Secondary | ICD-10-CM

## 2020-04-28 DIAGNOSIS — L509 Urticaria, unspecified: Secondary | ICD-10-CM

## 2020-04-28 MED ORDER — LORATADINE 10 MG PO TABS: 10.0000 mg | ORAL_TABLET | Freq: Two times a day (BID) | ORAL | 5 refills | Status: DC

## 2020-04-28 MED ORDER — TIZANIDINE HCL 2 MG PO TABS: 1.0000 mg | ORAL_TABLET | Freq: Three times a day (TID) | ORAL | 3 refills | Status: DC | PRN

## 2020-04-28 MED ORDER — FAMOTIDINE 20 MG PO TABS: 20.0000 mg | ORAL_TABLET | Freq: Two times a day (BID) | ORAL | Status: AC

## 2020-04-28 MED FILL — tiZANidine HCL 2 MG TABS: 2 | 7 days supply | Qty: 40 | Fill #0

## 2020-04-28 NOTE — Assessment & Plan Note (Signed)
hgba1c acceptable, minimize simple carbs.  

## 2020-04-28 NOTE — Patient Instructions (Signed)
Arterial Thoracic Outlet Syndrome  Arterial thoracic outlet syndrome (TOS) is a condition that happens when the artery that carries blood to your arm and hand (subclavian artery) is squeezed (compressed). To reach your arm, the artery must pass through a tight space under your collarbone (clavicle) and above your top rib (thoracic outlet). There are different types of TOS, and this type is the rarest. Depending on which structures are affected, you may have symptoms on one or both sides of your body. What are the causes? This condition may be caused by having an extra rib at the base of your neck (cervical rib). This rib presses on your subclavian artery. Over time, this pressure may cause a clot to form inside the artery, or the artery may weaken and balloon outward (aneurysm). What increases the risk? The following factors may make you more likely to develop this condition:  Having been born with a cervical rib.  Being overweight.  Having poor posture. What are the signs or symptoms? Symptoms of this condition include:  Pain and cramps in your arm or hand.  Pale skin or a change in color of the skin on your hand and arm.  Very cold hands.  In rare cases, some people may have muscle loss in their hands. These signs and symptoms may be worse when you hold your arms over your head. How is this diagnosed? Your health care provider may suspect TOS from your symptoms. A physical exam will be done. During the exam, your health care provider may ask you to hold your arms over your head and in other positions to check whether your symptoms get worse. Tests may also be done to confirm the diagnosis and to find out what is causing TOS. These may include:  Imaging studies, such as: ? X-rays to look for a cervical rib or other abnormality of the ribs. ? A test using sound waves to create an image (ultrasound). ? CT scan. ? MRI.  A test that involves measuring and recording the pulses in your  wrists (pulse volume recording).  A test in which X-rays are done after dye is injected into your subclavian artery or vein (venogram or angiogram). How is this treated? This condition may be treated with surgery to:  Remove the cervical rib.  Remove a blood clot (thrombus).  Repair an aneurysm. Treatment may also include:  A procedure to open up the clotted vein and restore blood flow (angioplasty).  Medicine, including blood thinners or blood clot dissolvers. Follow these instructions at home:  Take over-the-counter and prescription medicines only as told by your health care provider.  Do stretching exercises as told by your health care provider or physical therapist.  Maintain good posture.  Do not carry heavy bags over your shoulder or repetitively lift heavy objects over your head.  Take frequent breaks to stretch and rest your arms if you work at a keyboard or do other repetitive work with your hands and arms.  Keep all follow-up visits as told by your health care provider. This is important. Contact a health care provider if:  You have pain, cramps, numbness, or tingling in your arm or hand.  Your arm or hand often feels tired.  Your arm turns a darker and different skin color than usual.  Your hand feels cold.  You have frequent headaches or neck pain.  You have muscle loss in your hand. Get help right away if:  You lose feeling in your arm or hand.  You  cannot move your fingers.  Your fingers turn a dark color. Summary  Arterial thoracic outlet syndrome (TOS) is a condition that happens when the artery that carries blood to your arm and hand (subclavian artery) is squeezed (compressed).  This condition may be caused by having an extra rib at the base of your neck (cervical rib). This rib presses on your subclavian artery.  Symptoms of this condition include pain and cramps in your arm or hand, pale skin or change in color of the skin on your hand and  arm, very cold hands, and muscle loss in your hands.  This condition may be treated with surgery, angioplasty, and medicines. This information is not intended to replace advice given to you by your health care provider. Make sure you discuss any questions you have with your health care provider. Document Revised: 11/22/2017 Document Reviewed: 07/16/2017 Elsevier Patient Education  2020 [employer].

## 2020-04-28 NOTE — Assessment & Plan Note (Signed)
Supplement and monitor 

## 2020-04-28 NOTE — Assessment & Plan Note (Signed)
[facility] and had been working through things but then his 74 year old

## 2020-04-28 NOTE — Assessment & Plan Note (Signed)
Encouraged heart healthy diet, increase exercise, avoid trans fats, consider a krill oil cap daily 

## 2020-05-01 DIAGNOSIS — L509 Urticaria, unspecified: Secondary | ICD-10-CM | POA: Insufficient documentation

## 2020-05-01 NOTE — Progress Notes (Signed)
Subjective:    Patient ID: Christopher Haynes, male    DOB: 16-Dec-1946, 74 y.o.   MRN: [id number]  Chief Complaint  Patient presents with  . Numbness    left arm, when turns head left feels tingling  . Urticaria    arms,legs,trunk  . Otalgia    left ear    HPI Patient is in today for chronic medical concerns. He is struggling with neck, left shoulder and left arm pain. It is disrupting sleep. No recent trauma or fall. Is having some trouble with allergies and congestion. Denies CP/palp/SOB/HA/fevers/GI or GU c/o. Taking meds as prescribed. He has been very stressed over the recent serious illness of his 36 year old niece. He has been struggling with hives since then. [facility]   Past Medical History:  Diagnosis Date  . Anxiety 08/05/2017  . Back pain 08/20/2016  . Benign prostatic hyperplasia (BPH) with urinary urgency 02/09/2016  . Constipation 02/20/2016  . Contusion 02/20/2016  . Elevated PSA   . H/O measles   . Headache   . History of colonic polyps 02/09/2016   [facility] roughly 2 years ago. Now on 5 year plan  . Hyperglycemia 08/20/2016  . Hyperlipidemia, mixed 02/08/2017  . Macular degeneration of left eye 02/19/2016  . Migraine with visual aura 02/09/2016  . Neck pain 08/05/2017  . OSA on CPAP   . Pain in joint, shoulder region 02/19/2016  . Peripheral neuropathy 08/05/2017  . Thyroid disease    thyroiditis, h/o in 35s    Past Surgical History:  Procedure Laterality Date  . ANKLE FRACTURE SURGERY  66  . KNEE ARTHROSCOPY WITH SUBCHONDROPLASTY Left 12/13/2017   Procedure: KNEE ARTHROSCOPY WITH SUBCHONDROPLASTY;  Surgeon: Melrose Nakayama, MD;  Location: [place];  Service: Orthopedics;  Laterality: Left;  . SKIN GRAFT  Age 5  . TONSILLECTOMY  Age 74    Family History  Problem Relation Age of Onset  . Hypertension Mother   . Diabetes Mother   . Colon cancer Father   . Cancer Father        rectal with  mets  . Hypertension Brother   . Obesity Son   . Other Son        fatty liver  . Allergic Disorder Son        seasonal  . Stroke Paternal Grandfather     Social History   Socioeconomic History  . Marital status: Married    Spouse name: Not on file  . Number of children: Not on file  . Years of education: Not on file  . Highest education level: Not on file  Occupational History  . Occupation: Forensic psychologist  Tobacco Use  . Smoking status: Never Smoker  . Smokeless tobacco: Never Used  Substance and Sexual Activity  . Alcohol use: Yes    Alcohol/week: 0.0 standard drinks  . Drug use: No  . Sexual activity: Yes    Comment: lives with wife, work in Scientist, research (life sciences) estate, no dietary restrictions  Other Topics Concern  . Not on file  Social History Narrative  . Not on file   Social Determinants of Health   Financial Resource Strain:   . Difficulty of Paying Living Expenses:   Food Insecurity:   . Worried About Charity fundraiser in the Last Year:   . [place] in the Last Year:   Transportation Needs:   . Lack of  Transportation (Medical):   Marland Kitchen Lack of Transportation (Non-Medical):   Physical Activity:   . Days of Exercise per Week:   . Minutes of Exercise per Session:   Stress:   . Feeling of Stress :   Social Connections:   . Frequency of Communication with Friends and Family:   . Frequency of Social Gatherings with Friends and Family:   . Attends Religious Services:   . Active Member of Clubs or Organizations:   . Attends Banker Meetings:   Marland Kitchen Marital Status:   Intimate Partner Violence:   . Fear of Current or Ex-Partner:   . Emotionally Abused:   Marland Kitchen Physically Abused:   . Sexually Abused:     Outpatient Medications Prior to Visit  Medication Sig Dispense Refill  . diazepam (VALIUM) 10 MG tablet TAKE 1 TABLET (10 MG TOTAL) BY MOUTH DAILY AS NEEDED. 30 tablet 1  . diclofenac sodium (VOLTAREN) 1 % GEL Apply 2 g topically 4 (four) times daily.      . Omega-3 Fatty Acids (FISH OIL PO) Take by mouth daily.    . traMADol (ULTRAM) 50 MG tablet Take 1 tablet (50 mg total) by mouth daily as needed. 30 tablet 0  . cyclobenzaprine (FLEXERIL) 10 MG tablet Take 1 tablet (10 mg total) by mouth 3 (three) times daily as needed for muscle spasms. 30 tablet 0  . tamsulosin (FLOMAX) 0.4 MG CAPS capsule Take 0.4 mg by mouth.     No facility-administered medications prior to visit.    Allergies  Allergen Reactions  . Aspirin Hives    Patient states that takes Aspirin regularly without issues. Per primary at [facility] recommends no asa with hives because can exacerbate hives.    Review of Systems  Constitutional: Positive for malaise/fatigue. Negative for fever.  HENT: Negative for congestion.   Eyes: Negative for blurred vision.  Respiratory: Negative for shortness of breath.   Cardiovascular: Negative for chest pain, palpitations and leg swelling.  Gastrointestinal: Negative for abdominal pain, blood in stool and nausea.  Genitourinary: Negative for dysuria and frequency.  Musculoskeletal: Positive for joint pain, myalgias and neck pain. Negative for falls.  Skin: Positive for itching and rash.  Neurological: Negative for dizziness, loss of consciousness and headaches.  Endo/Heme/Allergies: Negative for environmental allergies.  Psychiatric/Behavioral: Negative for depression. The patient is nervous/anxious and has insomnia.        Objective:    Physical Exam Vitals and nursing note reviewed.  Constitutional:      General: He is not in acute distress.    Appearance: He is well-developed.  HENT:     Head: Normocephalic and atraumatic.     Nose: Nose normal.  Eyes:     General:        Right eye: No discharge.        Left eye: No discharge.  Cardiovascular:     Rate and Rhythm: Normal rate and regular rhythm.     Heart sounds: No murmur.  Pulmonary:     Effort: Pulmonary effort is normal.     Breath sounds: Normal breath sounds.   Abdominal:     General: Bowel sounds are normal.     Palpations: Abdomen is soft.     Tenderness: There is no abdominal tenderness.  Musculoskeletal:     Cervical back: Normal range of motion and neck supple.  Skin:    General: Skin is warm and dry.  Neurological:     Mental Status: He is alert and  oriented to person, place, and time.     BP (!) 158/88 (BP Location: Left Arm, Cuff Size: Normal)   Pulse (!) 109   Temp (!) 97.5 F (36.4 C) (Temporal)   Resp 12   Ht 5\' 5"  (1.651 m)   Wt 186 lb (84.4 kg)   SpO2 96%   BMI 30.95 kg/m  Wt Readings from Last 3 Encounters:  04/28/20 186 lb (84.4 kg)  02/22/20 183 lb (83 kg)  08/24/19 179 lb (81.2 kg)    Diabetic Foot Exam - Simple   No data filed     Lab Results  Component Value Date   WBC 4.8 02/22/2020   HGB 14.5 02/22/2020   HCT 43.3 02/22/2020   PLT 226.0 02/22/2020   GLUCOSE 101 (H) 02/22/2020   CHOL 233 (H) 02/22/2020   TRIG 108.0 02/22/2020   HDL 51.70 02/22/2020   LDLCALC 160 (H) 02/22/2020   ALT 25 02/22/2020   AST 20 02/22/2020   NA 141 02/22/2020   K 4.6 02/22/2020   CL 105 02/22/2020   CREATININE 0.86 02/22/2020   BUN 17 02/22/2020   CO2 29 02/22/2020   TSH 2.17 02/22/2020   PSA 7.85 (H) 02/08/2017   HGBA1C 5.8 02/22/2020    Lab Results  Component Value Date   TSH 2.17 02/22/2020   Lab Results  Component Value Date   WBC 4.8 02/22/2020   HGB 14.5 02/22/2020   HCT 43.3 02/22/2020   MCV 90.1 02/22/2020   PLT 226.0 02/22/2020   Lab Results  Component Value Date   NA 141 02/22/2020   K 4.6 02/22/2020   CO2 29 02/22/2020   GLUCOSE 101 (H) 02/22/2020   BUN 17 02/22/2020   CREATININE 0.86 02/22/2020   BILITOT 0.6 02/22/2020   ALKPHOS 92 02/22/2020   AST 20 02/22/2020   ALT 25 02/22/2020   PROT 6.6 02/22/2020   ALBUMIN 4.1 02/22/2020   CALCIUM 9.7 02/22/2020   GFR 86.98 02/22/2020   Lab Results  Component Value Date   CHOL 233 (H) 02/22/2020   Lab Results  Component Value Date    HDL 51.70 02/22/2020   Lab Results  Component Value Date   LDLCALC 160 (H) 02/22/2020   Lab Results  Component Value Date   TRIG 108.0 02/22/2020   Lab Results  Component Value Date   CHOLHDL 5 02/22/2020   Lab Results  Component Value Date   HGBA1C 5.8 02/22/2020       Assessment & Plan:   Problem List Items Addressed This Visit    Hyperglycemia    hgba1c acceptable, minimize simple carbs.       Hyperlipidemia, mixed    Encouraged heart healthy diet, increase exercise, avoid trans fats, consider a krill oil cap daily      Neck pain    Encouraged moist heat and gentle stretching as tolerated. May try NSAIDs and prescription meds as directed and report if symptoms worsen or seek immediate care. Will try Tizanidine 1-4 mg po prn cervical xrays ordered and reviewed and patient referred to PT      Anxiety    [facility] and had been working through things but then his 74 year old      Vitamin D deficiency    Supplement and monitor      Hives    Secondary to stress that has escalated secondary to his niece having renal failure recently at age 4 and now being on dialysis. Encouraged  zyrtec and famotidine bid        Other Visit Diagnoses    Neck pain on left side    -  Primary   Relevant Orders   Ambulatory referral to Physical Therapy   DG Cervical Spine 2 or 3 views (Completed)      I have discontinued Lash Langland's cyclobenzaprine. I am also having him start on famotidine, loratadine, and tiZANidine. Additionally, I am having him maintain his Omega-3 Fatty Acids (FISH OIL PO), traMADol, tamsulosin, diclofenac sodium, and diazepam.  Meds ordered this encounter  Medications  . famotidine (PEPCID) 20 MG tablet    Sig: Take 1 tablet (20 mg total) by mouth 2 (two) times daily.    Dispense:  60 tablet  . loratadine (CLARITIN) 10 MG tablet    Sig: Take 1 tablet (10 mg total) by mouth 2 (two) times daily.    Dispense:  60 tablet     Refill:  5  . tiZANidine (ZANAFLEX) 2 MG tablet    Sig: Take 0.5-2 tablets (1-4 mg total) by mouth every 8 (eight) hours as needed for muscle spasms.    Dispense:  40 tablet    Refill:  3     Danise Edge, MD

## 2020-05-01 NOTE — Assessment & Plan Note (Signed)
Secondary to stress that has escalated secondary to his niece having renal failure recently at age 74 and now being on dialysis. Encouraged zyrtec and famotidine bid

## 2020-05-01 NOTE — Assessment & Plan Note (Addendum)
Encouraged moist heat and gentle stretching as tolerated. May try NSAIDs and prescription meds as directed and report if symptoms worsen or seek immediate care. Will try Tizanidine 1-4 mg po prn cervical xrays ordered and reviewed and patient referred to PT

## 2020-05-04 ENCOUNTER — Encounter: Payer: Self-pay | Admitting: Physical Therapy

## 2020-05-04 ENCOUNTER — Ambulatory Visit: Payer: Medicare Other | Attending: Family Medicine | Admitting: Physical Therapy

## 2020-05-04 ENCOUNTER — Other Ambulatory Visit: Payer: Self-pay

## 2020-05-04 DIAGNOSIS — M5412 Radiculopathy, cervical region: Secondary | ICD-10-CM

## 2020-05-04 DIAGNOSIS — R29898 Other symptoms and signs involving the musculoskeletal system: Secondary | ICD-10-CM | POA: Diagnosis not present

## 2020-05-04 DIAGNOSIS — R252 Cramp and spasm: Secondary | ICD-10-CM | POA: Insufficient documentation

## 2020-05-04 DIAGNOSIS — M62838 Other muscle spasm: Secondary | ICD-10-CM | POA: Insufficient documentation

## 2020-05-04 NOTE — Therapy (Addendum)
[facility] [place] [address]  [place], [place], [address] Phone: [phone]   Fax:  [phone]  Physical Therapy Evaluation / Discharge Summary  Patient Details  Name: Christopher Haynes MRN: [id number] Date of Birth: Oct 22, 1946 Referring Provider (PT): Penni Homans, MD   Encounter Date: 05/04/2020  PT End of Session - 05/04/20 1000    Visit Number  1    Number of Visits  4    Date for PT Re-Evaluation  06/01/20    Authorization Type  BCBS Medicare    PT Start Time  1000    PT Stop Time  1117    PT Time Calculation (min)  77 min    Activity Tolerance  Patient tolerated treatment well    Behavior During Therapy  [facility] for tasks assessed/performed       Past Medical History:  Diagnosis Date  . Anxiety 08/05/2017  . Back pain 08/20/2016  . Benign prostatic hyperplasia (BPH) with urinary urgency 02/09/2016  . Constipation 02/20/2016  . Contusion 02/20/2016  . Elevated PSA   . H/O measles   . Headache   . History of colonic polyps 02/09/2016   [facility] years ago. Now on 5 year plan  . Hyperglycemia 08/20/2016  . Hyperlipidemia, mixed 02/08/2017  . Macular degeneration of left eye 02/19/2016  . Migraine with visual aura 02/09/2016  . Neck pain 08/05/2017  . OSA on CPAP   . Pain in joint, shoulder region 02/19/2016  . Peripheral neuropathy 08/05/2017  . Thyroid disease    thyroiditis, h/o in 86s    Past Surgical History:  Procedure Laterality Date  . ANKLE FRACTURE SURGERY  66  . KNEE ARTHROSCOPY WITH SUBCHONDROPLASTY Left 12/13/2017   Procedure: KNEE ARTHROSCOPY WITH SUBCHONDROPLASTY;  Surgeon: Melrose Nakayama, MD;  Location: [place];  Service: Orthopedics;  Laterality: Left;  . SKIN GRAFT  Age 55  . TONSILLECTOMY  Age 10    There were no vitals filed for this visit.   Subjective Assessment - 05/04/20 1006    Subjective  Pt denies neck pain but reports occasional  muscle spasms in neck and L shoulder which if not addressed will create tension leading to migraine headache. Has been able to control this with med cocktail of pain meds and muscle relaxants. More recently notes onset of nerve symptoms (tingling > numbness) in L arm with L neck rotation or lifting his 60# dog. States MD has told him that he has arthritis and degenerative changes per x-ray, but no MRI done to determine nerve involvement. Would like to put off having surgery as long as possible. Hoping to find out if PT can help strengthening to reduce symptoms.    Diagnostic tests  04/28/20 cervical x-ray:  Straightening of the cervical spine. Trace anterolisthesis C4 on C5. Moderate to marked degenerative changes most notable at C3-C4, C5-C6 and C6-C7 with mild degenerative changes elsewhere in the spine.    Patient Stated Goals  none stated - "not sure PT is the answer for what I have"    Currently in Pain?  No/denies    Pain Score  0-No pain   4/10 when muscle spasms occur   Pain Location  Neck    Pain Orientation  Left    Pain Descriptors / Indicators  Tingling;Numbness;Cramping    Pain Type  Acute pain    Pain Radiating Towards  intermittent tingling > numbness in L arm with  occasional cramping in L 1st 3 fingers    Pain Onset  More than a month ago   2 months   Pain Frequency  Intermittent    Aggravating Factors   turning head to L or looking up with head turned/tilted to L, lifting his dog    Pain Relieving Factors  leaning head to R, muscle relaxants, moist heat         [facility] PT Assessment - 05/04/20 1000      Assessment   Medical Diagnosis  L neck pain with UE radiculopathy    Referring Provider (PT)  Penni Homans, MD    Onset Date/Surgical Date  --   ~2 months   Hand Dominance  Right    Next MD Visit  08/25/20    Prior Therapy  PT for knee (s/p meniscus tear) and fracture ankle      Precautions   Precautions  None      Restrictions   Weight Bearing Restrictions  No       Balance Screen   Has the patient fallen in the past 6 months  No    Has the patient had a decrease in activity level because of a fear of falling?   No    Is the patient reluctant to leave their home because of a fear of falling?   No      Home Environment   Living Environment  Private residence    Living Arrangements  Spouse/significant other    Type of [place]  Two level;Bed/bath upstairs      Prior Function   Level of Independence  Independent    Vocation  Part time employment;Retired    Editor, commissioning estate - basically stopped working since [place]  working/puttering in work shed or yard; gym 2x/wk prior to [employer] (mostly cardio)      Cognition   Overall Cognitive Status  Within Functional Limits for tasks assessed      Observation/Other Assessments   Focus on Therapeutic Outcomes (FOTO)   Neck - 87% (13% limitation); Predicted 80% (20% limitation)      Posture/Postural Control   Posture/Postural Control  Postural limitations    Postural Limitations  Forward head;Rounded Shoulders   mild     ROM / Strength   AROM / PROM / Strength  AROM;Strength      AROM   AROM Assessment Site  Cervical    Cervical Flexion  58    Cervical Extension  34   tingling in L arm'   Cervical - Right Side Bend  26    Cervical - Left Side Bend  20    Cervical - Right Rotation  60    Cervical - Left Rotation  50   tingling in L arm     Strength   Right/Left Shoulder  Right;Left    Right Shoulder Flexion  5/5    Right Shoulder ABduction  5/5    Right Shoulder Internal Rotation  5/5    Right Shoulder External Rotation  5/5    Left Shoulder Flexion  5/5    Left Shoulder ABduction  5/5    Left Shoulder Internal Rotation  5/5    Left Shoulder External Rotation  5/5    Right Hand Grip (lbs)  34.67   41, 30, 33   Left Hand Grip (lbs)  40.33   43, 38, 40     Palpation  Spinal mobility  2/6 hypomobility with cervical CPAs    Palpation comment   increased muscle tension with mild ttp in L UT, LS, cervical paraspinals & pecs                  Objective measurements completed on examination: See above findings.      [facility] PT Treatment/Exercise - 05/04/20 1000      Exercises   Exercises  Neck      Neck Exercises: Seated   Neck Retraction  5 secs   3 reps   Neck Retraction Limitations  movement reproducing L UE radicular symptoms    Other Seated Exercise  Scap retraction & depression 10 x 5"      Neck Exercises: Stretches   Upper Trapezius Stretch  Left;30 seconds;1 rep    Levator Stretch  Left;30 seconds;1 rep                  PT Long Term Goals - 05/04/20 1117      PT LONG TERM GOAL #1   Title  Patient will be independent with initial HEP    Status  New    Target Date  06/01/20      PT LONG TERM GOAL #2   Title  Patient to demonstrate appropriate posture and body mechanics needed for daily activities    Status  New    Target Date  06/01/20      PT LONG TERM GOAL #3   Title  Patient to improve cervical AROM to WNL/WFL without reproduction of UE radiculopathy    Status  New    Target Date  06/01/20      PT LONG TERM GOAL #4   Title  Patient to report ability to perform ADLs, household and yardwork related tasks without triggering L UE radiculopathy or migraine    Status  New    Target Date  06/01/20             Plan - 05/04/20 1117    Clinical Impression Statement  Christopher Haynes is a 74 y/o male who presents to OP PT for L neck stiffness and L UE radiculopathy secondary to cervical DDD/arthritis and nerve impingement vs TOS. He denies neck pain but will experience occasional muscle spasms in neck and L shoulder which if not addressed will create tension up back of head to top of head leading to migraine headache. Within past 2-3 months he has noted onset of intermittent L UE tingling > numbness, most commonly triggered by L cervical rotation of after lifting his 60# dog off the bed.  Current deficits include poor posture, limited cervical ROM, and increased muscle tension with ttp over L UT, LS and pecs. 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 Artist will benefit from skilled PT to address above deficits and current functional limitations to reduce incidence of L UE radiculopathy and migraine headaches.    Personal Factors and Comorbidities  Comorbidity 3+;Past/Current Experience    Comorbidities  Hives related to anxiety, peripheral neuropathy, back pain, BPH, HLD, obesity, R knee pain, h/o L knee subcondroplasty and ankle fracture s/p ORIF    Examination-Activity Limitations  Caring for Others;Lift;Carry    Examination-Participation Restrictions  Yard Work    Stability/Clinical Decision Making  Evolving/Moderate complexity    Clinical Decision Making  Moderate    Rehab Potential  Good    PT Frequency  1x / week  PT Duration  4 weeks    PT Treatment/Interventions  ADLs/Self Care Home Management;Electrical Stimulation;Moist Heat;Traction;Ultrasound;Functional mobility training;Therapeutic activities;Therapeutic exercise;Neuromuscular re-education;Patient/family education;Manual techniques;Passive range of motion;Dry needling;Taping;Spinal Manipulations    PT Next Visit Plan  Review initial HEP, postural strengthening, manualt therapy to address abnormal muscle tension, posture and body mechanics education; modalities PRN    PT Home Exercise Plan  05/03/20 - UT & LS stretches, scap retraction/depression    Consulted and Agree with Plan of Care  Patient       Patient will benefit from skilled therapeutic intervention in order to improve the following deficits and impairments:  Decreased activity tolerance, Decreased range of motion, Increased fascial restricitons, Increased muscle spasms, Impaired flexibility, Postural dysfunction, Improper body mechanics, Pain  Visit Diagnosis: Radiculopathy, cervical region - Plan: PT plan of care  cert/re-cert  Other muscle spasm - Plan: PT plan of care cert/re-cert  Cramp and spasm - Plan: PT plan of care cert/re-cert  Other symptoms and signs involving the musculoskeletal system - Plan: PT plan of care cert/re-cert     Problem List Patient Active Problem List   Diagnosis Date Noted  . Hives 05/01/2020  . Headache 02/19/2019  . Abdominal wall hernia 02/19/2019  . Obesity 02/19/2019  . Vitamin D deficiency 02/13/2018  . Neck pain 08/05/2017  . Anxiety 08/05/2017  . Right knee pain 08/05/2017  . Peripheral neuropathy 08/05/2017  . Preventative health care 02/11/2017  . Hyperlipidemia, mixed 02/08/2017  . Back pain 08/20/2016  . Hyperglycemia 08/20/2016  . Constipation 02/20/2016  . Macular degeneration of left eye 02/19/2016  . Migraine with visual aura 02/09/2016  . Benign prostatic hyperplasia (BPH) with urinary urgency 02/09/2016  . History of colonic polyps 02/09/2016  . Elevated PSA   . CONTACT DERMATITIS&OTHER ECZEMA DUE TO PLANTS 11/12/2008  . Obstructive sleep apnea 07/28/2008    Percival Spanish, PT, MPT 05/04/2020, 1:54 PM  [facility] [address]  [place], [place], [address] Phone: [phone]   Fax:  [phone]  Name: Christopher Haynes MRN: [id number] Date of Birth: 04-Apr-1946   PHYSICAL THERAPY DISCHARGE SUMMARY  Visits from Start of Care: 1  Current functional level related to goals / functional outcomes:   Refer to above eval. Patient choose not to schedule any treatment visits stating he "does not think PT will help, it will only strenthen muscles around the pain".   Remaining deficits:  As above.    Education / Equipment:   Initial HEP  Plan: Patient agrees to discharge.  Patient goals were not met. Patient is being discharged due to not returning since the last visit.  ?????     Percival Spanish, PT, MPT 07/19/20, 2:32 PM  [facility] [address]  [place], [place], [address] Phone: [phone]   Fax:  [phone]

## 2020-05-04 NOTE — Patient Instructions (Signed)
    Home exercise program created by Merion Grimaldo, PT.  For questions, please contact Gaylon Melchor via phone at [phone] or email at [email or web address]  [facility] [place] [address]  [address] [place], [place], [address] Phone: [phone]   Fax:  [phone]    

## 2020-05-12 ENCOUNTER — Other Ambulatory Visit: Payer: Self-pay | Admitting: Family Medicine

## 2020-05-12 ENCOUNTER — Encounter: Payer: Self-pay | Admitting: Family Medicine

## 2020-05-12 DIAGNOSIS — L509 Urticaria, unspecified: Secondary | ICD-10-CM

## 2020-05-12 MED ORDER — METHYLPREDNISOLONE 4 MG PO TABS: ORAL_TABLET | ORAL | 0 refills | Status: DC

## 2020-05-12 NOTE — Telephone Encounter (Signed)
Please see message below

## 2020-05-12 NOTE — Telephone Encounter (Signed)
Caller: Maralyn Sago Call back number: [phone]  Gillie Manners please refer to your e-mail for pictures.

## 2020-05-13 ENCOUNTER — Encounter: Payer: Self-pay | Admitting: Family Medicine

## 2020-05-27 ENCOUNTER — Other Ambulatory Visit: Payer: Self-pay | Admitting: Family Medicine

## 2020-05-27 NOTE — Telephone Encounter (Signed)
Requesting: valium Contract:n/a UDS:02/19/2019 Last Visit:04/28/20 Next Visit:09/01/20 Last Refill:03/23/20  Please Advise

## 2020-06-03 DIAGNOSIS — R21 Rash and other nonspecific skin eruption: Secondary | ICD-10-CM | POA: Diagnosis not present

## 2020-06-03 DIAGNOSIS — T783XXA Angioneurotic edema, initial encounter: Secondary | ICD-10-CM | POA: Diagnosis not present

## 2020-06-03 DIAGNOSIS — Z79899 Other long term (current) drug therapy: Secondary | ICD-10-CM | POA: Diagnosis not present

## 2020-07-08 MED FILL — DIAZEPAM 10 MG TABS: 10 | 30 days supply | Qty: 30 | Fill #1

## 2020-08-11 ENCOUNTER — Other Ambulatory Visit: Payer: Self-pay | Admitting: Family Medicine

## 2020-08-12 MED FILL — DIAZEPAM 10 MG TABS: 10 | 30 days supply | Qty: 30 | Fill #0

## 2020-08-25 ENCOUNTER — Other Ambulatory Visit: Payer: Self-pay

## 2020-08-25 ENCOUNTER — Ambulatory Visit (INDEPENDENT_AMBULATORY_CARE_PROVIDER_SITE_OTHER): Payer: Medicare Other | Admitting: Family Medicine

## 2020-08-25 VITALS — BP 122/78 | HR 67 | Temp 98.4°F | Resp 13 | Ht 65.0 in | Wt 182.6 lb

## 2020-08-25 DIAGNOSIS — G44209 Tension-type headache, unspecified, not intractable: Secondary | ICD-10-CM

## 2020-08-25 DIAGNOSIS — E559 Vitamin D deficiency, unspecified: Secondary | ICD-10-CM | POA: Diagnosis not present

## 2020-08-25 DIAGNOSIS — R739 Hyperglycemia, unspecified: Secondary | ICD-10-CM | POA: Diagnosis not present

## 2020-08-25 DIAGNOSIS — R972 Elevated prostate specific antigen [PSA]: Secondary | ICD-10-CM

## 2020-08-25 DIAGNOSIS — E782 Mixed hyperlipidemia: Secondary | ICD-10-CM

## 2020-08-25 DIAGNOSIS — L509 Urticaria, unspecified: Secondary | ICD-10-CM | POA: Diagnosis not present

## 2020-08-25 NOTE — Progress Notes (Signed)
Subjective:    Patient ID: Christopher Haynes, male    DOB: May 19, 1946, 74 y.o.   MRN: [id number]  Chief Complaint  Patient presents with  . 6 month follow up    HPI Patient is in today for follow up on chronic medical concerns. [facility] It is helping him cope. He also has chronic urticaria and is following with dermatology at the [facility] as well. This is fairly well controlled. He notes stress but is managing. Denies CP/palp/SOB/HA/congestion/fevers/GI or GU c/o. Taking meds as prescribed  Past Medical History:  Diagnosis Date  . Anxiety 08/05/2017  . Back pain 08/20/2016  . Benign prostatic hyperplasia (BPH) with urinary urgency 02/09/2016  . Constipation 02/20/2016  . Contusion 02/20/2016  . Elevated PSA   . H/O measles   . Headache   . History of colonic polyps 02/09/2016   [facility] roughly 2 years ago. Now on 5 year plan  . Hyperglycemia 08/20/2016  . Hyperlipidemia, mixed 02/08/2017  . Macular degeneration of left eye 02/19/2016  . Migraine with visual aura 02/09/2016  . Neck pain 08/05/2017  . OSA on CPAP   . Pain in joint, shoulder region 02/19/2016  . Peripheral neuropathy 08/05/2017  . Thyroid disease    thyroiditis, h/o in 92s    Past Surgical History:  Procedure Laterality Date  . ANKLE FRACTURE SURGERY  66  . KNEE ARTHROSCOPY WITH SUBCHONDROPLASTY Left 12/13/2017   Procedure: KNEE ARTHROSCOPY WITH SUBCHONDROPLASTY;  Surgeon: Marcene Corning, MD;  Location: [facility];  Service: Orthopedics;  Laterality: Left;  . SKIN GRAFT  Age 80  . TONSILLECTOMY  Age 82    Family History  Problem Relation Age of Onset  . Hypertension Mother   . Diabetes Mother   . Colon cancer Father   . Cancer Father        rectal with mets  . Hypertension Brother   . Obesity Son   . Other Son        fatty liver  . Allergic Disorder Son        seasonal  . Stroke Paternal Grandfather     Social History    Socioeconomic History  . Marital status: Married    Spouse name: Not on file  . Number of children: Not on file  . Years of education: Not on file  . Highest education level: Not on file  Occupational History  . Occupation: Customer service manager  Tobacco Use  . Smoking status: Never Smoker  . Smokeless tobacco: Never Used  Substance and Sexual Activity  . Alcohol use: Yes    Alcohol/week: 0.0 standard drinks  . Drug use: No  . Sexual activity: Yes    Comment: lives with wife, work in Audiological scientist estate, no dietary restrictions  Other Topics Concern  . Not on file  Social History Narrative  . Not on file   Social Determinants of Health   Financial Resource Strain:   . Difficulty of Paying Living Expenses: Not on file  Food Insecurity:   . Worried About Programme researcher, broadcasting/film/video in the Last Year: Not on file  . Ran Out of Food in the Last Year: Not on file  Transportation Needs:   . Lack of Transportation (Medical): Not on file  . Lack of Transportation (Non-Medical): Not on file  Physical Activity:   . Days of Exercise per Week: Not on file  . Minutes of Exercise per  Session: Not on file  Stress:   . Feeling of Stress : Not on file  Social Connections:   . Frequency of Communication with Friends and Family: Not on file  . Frequency of Social Gatherings with Friends and Family: Not on file  . Attends Religious Services: Not on file  . Active Member of Clubs or Organizations: Not on file  . Attends Banker Meetings: Not on file  . Marital Status: Not on file  Intimate Partner Violence:   . Fear of Current or Ex-Partner: Not on file  . Emotionally Abused: Not on file  . Physically Abused: Not on file  . Sexually Abused: Not on file    Outpatient Medications Prior to Visit  Medication Sig Dispense Refill  . diazepam (VALIUM) 10 MG tablet TAKE 1 TABLET BY MOUTH ONCE DAILY AS NEEDED 30 tablet 1  . diclofenac sodium (VOLTAREN) 1 % GEL Apply 2 g topically 4 (four) times  daily.    . famotidine (PEPCID) 20 MG tablet Take 1 tablet (20 mg total) by mouth 2 (two) times daily. 60 tablet   . loratadine (CLARITIN) 10 MG tablet Take 1 tablet (10 mg total) by mouth 2 (two) times daily. 60 tablet 5  . methylPREDNISolone (MEDROL) 4 MG tablet 5 tab po qd X 3d then 4 tab po qd X 3d then 3 tab po qd X 3d then 2 tab po qd x 3d then 1 tab po qd x 3d 45 tablet 0  . Omega-3 Fatty Acids (FISH OIL PO) Take by mouth daily.    . tamsulosin (FLOMAX) 0.4 MG CAPS capsule Take 0.8 mg by mouth daily.     Marland Kitchen tiZANidine (ZANAFLEX) 2 MG tablet Take 0.5-2 tablets (1-4 mg total) by mouth every 8 (eight) hours as needed for muscle spasms. 40 tablet 3  . traMADol (ULTRAM) 50 MG tablet Take 1 tablet (50 mg total) by mouth daily as needed. 30 tablet 0   No facility-administered medications prior to visit.    Allergies  Allergen Reactions  . Aspirin Hives    Patient states that takes Aspirin regularly without issues. Per primary at [facility] recommends no asa with hives because can exacerbate hives.    Review of Systems  Constitutional: Negative for fever and malaise/fatigue.  HENT: Negative for congestion.   Eyes: Negative for blurred vision.  Respiratory: Negative for shortness of breath.   Cardiovascular: Negative for chest pain, palpitations and leg swelling.  Gastrointestinal: Negative for abdominal pain, blood in stool and nausea.  Genitourinary: Negative for dysuria and frequency.  Musculoskeletal: Negative for falls.  Skin: Negative for rash.  Neurological: Negative for dizziness, loss of consciousness and headaches.  Endo/Heme/Allergies: Negative for environmental allergies.  Psychiatric/Behavioral: Negative for depression. The patient is nervous/anxious.        Objective:    Physical Exam Vitals and nursing note reviewed.  Constitutional:      General: He is not in acute distress.    Appearance: He is well-developed.  HENT:     Head: Normocephalic and atraumatic.     Nose:  Nose normal.  Eyes:     General:        Right eye: No discharge.        Left eye: No discharge.  Cardiovascular:     Rate and Rhythm: Normal rate and regular rhythm.     Heart sounds: No murmur heard.   Pulmonary:     Effort: Pulmonary effort is normal.     Breath  sounds: Normal breath sounds.  Abdominal:     General: Bowel sounds are normal.     Palpations: Abdomen is soft.     Tenderness: There is no abdominal tenderness.  Musculoskeletal:     Cervical back: Normal range of motion and neck supple.  Skin:    General: Skin is warm and dry.  Neurological:     Mental Status: He is alert and oriented to person, place, and time.     BP 122/78 (BP Location: Left Arm, Patient Position: Sitting, Cuff Size: Large)   Pulse 67   Temp 98.4 F (36.9 C) (Oral)   Resp 13   Ht 5\' 5"  (1.651 m)   Wt 182 lb 9.6 oz (82.8 kg)   SpO2 100%   BMI 30.39 kg/m  Wt Readings from Last 3 Encounters:  08/25/20 182 lb 9.6 oz (82.8 kg)  04/28/20 186 lb (84.4 kg)  02/22/20 183 lb (83 kg)    Diabetic Foot Exam - Simple   No data filed     Lab Results  Component Value Date   WBC 4.8 02/22/2020   HGB 14.5 02/22/2020   HCT 43.3 02/22/2020   PLT 226.0 02/22/2020   GLUCOSE 101 (H) 02/22/2020   CHOL 233 (H) 02/22/2020   TRIG 108.0 02/22/2020   HDL 51.70 02/22/2020   LDLCALC 160 (H) 02/22/2020   ALT 25 02/22/2020   AST 20 02/22/2020   NA 141 02/22/2020   K 4.6 02/22/2020   CL 105 02/22/2020   CREATININE 0.86 02/22/2020   BUN 17 02/22/2020   CO2 29 02/22/2020   TSH 2.17 02/22/2020   PSA 7.85 (H) 02/08/2017   HGBA1C 5.8 02/22/2020    Lab Results  Component Value Date   TSH 2.17 02/22/2020   Lab Results  Component Value Date   WBC 4.8 02/22/2020   HGB 14.5 02/22/2020   HCT 43.3 02/22/2020   MCV 90.1 02/22/2020   PLT 226.0 02/22/2020   Lab Results  Component Value Date   NA 141 02/22/2020   K 4.6 02/22/2020   CO2 29 02/22/2020   GLUCOSE 101 (H) 02/22/2020   BUN 17 02/22/2020    CREATININE 0.86 02/22/2020   BILITOT 0.6 02/22/2020   ALKPHOS 92 02/22/2020   AST 20 02/22/2020   ALT 25 02/22/2020   PROT 6.6 02/22/2020   ALBUMIN 4.1 02/22/2020   CALCIUM 9.7 02/22/2020   GFR 86.98 02/22/2020   Lab Results  Component Value Date   CHOL 233 (H) 02/22/2020   Lab Results  Component Value Date   HDL 51.70 02/22/2020   Lab Results  Component Value Date   LDLCALC 160 (H) 02/22/2020   Lab Results  Component Value Date   TRIG 108.0 02/22/2020   Lab Results  Component Value Date   CHOLHDL 5 02/22/2020   Lab Results  Component Value Date   HGBA1C 5.8 02/22/2020       Assessment & Plan:   Problem List Items Addressed This Visit    Elevated PSA    Doing well on Tamulosin, follows with urology      Hyperglycemia - Primary    hgba1c acceptable, minimize simple carbs. Increase exercise as tolerated.      Hyperlipidemia, mixed    Encouraged heart healthy diet, increase exercise, avoid trans fats, consider a krill oil cap daily      Vitamin D deficiency   Headache    Encouraged increased hydration, 64 ounces of clear fluids daily. Minimize alcohol and caffeine. Eat  small frequent meals with lean proteins and complex carbs. Avoid high and low blood sugars. Get adequate sleep, 7-8 hours a night. Needs exercise daily preferably in the morning.      Hives    Chronic and following with dermatology now maintained on anti histamines daily with good results.          I am having Lurline IdolFidel Lahman maintain his Omega-3 Fatty Acids (FISH OIL PO), traMADol, tamsulosin, diclofenac sodium, famotidine, loratadine, tiZANidine, methylPREDNISolone, and diazepam.  No orders of the defined types were placed in this encounter.    Danise EdgeStacey Airika Alkhatib, MD

## 2020-08-25 NOTE — Patient Instructions (Signed)

## 2020-08-25 NOTE — Assessment & Plan Note (Signed)
Encouraged increased hydration, 64 ounces of clear fluids daily. Minimize alcohol and caffeine. Eat small frequent meals with lean proteins and complex carbs. Avoid high and low blood sugars. Get adequate sleep, 7-8 hours a night. Needs exercise daily preferably in the morning.  

## 2020-08-25 NOTE — Assessment & Plan Note (Signed)
Doing well on Tamulosin, follows with urology

## 2020-08-25 NOTE — Assessment & Plan Note (Signed)
hgba1c acceptable, minimize simple carbs. Increase exercise as tolerated.  

## 2020-08-25 NOTE — Assessment & Plan Note (Signed)
Chronic and following with dermatology now maintained on anti histamines daily with good results.

## 2020-08-25 NOTE — Assessment & Plan Note (Signed)
Encouraged heart healthy diet, increase exercise, avoid trans fats, consider a krill oil cap daily 

## 2020-08-26 LAB — LIPID PANEL
Cholesterol: 211 mg/dL — ABNORMAL HIGH (ref <200)
HDL: 53 mg/dL (ref >=40)
LDL Cholesterol (Calc): 132 mg/dL (calc) — ABNORMAL HIGH
Non-HDL Cholesterol (Calc): 158 mg/dL (calc) — ABNORMAL HIGH (ref <130)
Total CHOL/HDL Ratio: 4 (calc) (ref <5.0)
Triglycerides: 138 mg/dL (ref <150)

## 2020-08-26 LAB — CBC
HCT: 43.2 % (ref 38.5–50.0)
Hemoglobin: 14.3 g/dL (ref 13.2–17.1)
MCH: 29.8 pg (ref 27.0–33.0)
MCHC: 33.1 g/dL (ref 32.0–36.0)
MCV: 90 fL (ref 80.0–100.0)
MPV: 10.6 fL (ref 7.5–12.5)
Platelets: 222 10*3/uL (ref 140–400)
RBC: 4.8 10*6/uL (ref 4.20–5.80)
RDW: 12.8 % (ref 11.0–15.0)
WBC: 4.8 10*3/uL (ref 3.8–10.8)

## 2020-08-26 LAB — COMPREHENSIVE METABOLIC PANEL
AG Ratio: 1.6 (calc) (ref 1.0–2.5)
ALT: 18 U/L (ref 9–46)
AST: 15 U/L (ref 10–35)
Albumin: 4.1 g/dL (ref 3.6–5.1)
Alkaline phosphatase (APISO): 88 U/L (ref 35–144)
BUN: 16 mg/dL (ref 7–25)
CO2: 26 mmol/L (ref 20–32)
Calcium: 9 mg/dL (ref 8.6–10.3)
Chloride: 106 mmol/L (ref 98–110)
Creat: 0.91 mg/dL (ref 0.70–1.18)
Globulin: 2.6 g/dL (calc) (ref 1.9–3.7)
Glucose, Bld: 105 mg/dL — ABNORMAL HIGH (ref 65–99)
Potassium: 4.7 mmol/L (ref 3.5–5.3)
Sodium: 140 mmol/L (ref 135–146)
Total Bilirubin: 0.5 mg/dL (ref 0.2–1.2)
Total Protein: 6.7 g/dL (ref 6.1–8.1)

## 2020-08-26 LAB — HEMOGLOBIN A1C
Hgb A1c MFr Bld: 5.8 % of total Hgb — ABNORMAL HIGH (ref <5.7)
Mean Plasma Glucose: 120 (calc)
eAG (mmol/L): 6.6 (calc)

## 2020-08-26 LAB — TSH: TSH: 3.48 mIU/L (ref 0.40–4.50)

## 2020-09-02 DIAGNOSIS — H25813 Combined forms of age-related cataract, bilateral: Secondary | ICD-10-CM | POA: Diagnosis not present

## 2020-09-02 DIAGNOSIS — H353111 Nonexudative age-related macular degeneration, right eye, early dry stage: Secondary | ICD-10-CM | POA: Diagnosis not present

## 2020-09-02 DIAGNOSIS — H43813 Vitreous degeneration, bilateral: Secondary | ICD-10-CM | POA: Diagnosis not present

## 2020-09-02 DIAGNOSIS — H353122 Nonexudative age-related macular degeneration, left eye, intermediate dry stage: Secondary | ICD-10-CM | POA: Diagnosis not present

## 2020-09-09 DIAGNOSIS — R972 Elevated prostate specific antigen [PSA]: Secondary | ICD-10-CM | POA: Diagnosis not present

## 2020-09-23 DIAGNOSIS — R972 Elevated prostate specific antigen [PSA]: Secondary | ICD-10-CM | POA: Diagnosis not present

## 2020-09-23 DIAGNOSIS — N401 Enlarged prostate with lower urinary tract symptoms: Secondary | ICD-10-CM | POA: Diagnosis not present

## 2020-09-23 DIAGNOSIS — N529 Male erectile dysfunction, unspecified: Secondary | ICD-10-CM | POA: Diagnosis not present

## 2020-09-23 DIAGNOSIS — N138 Other obstructive and reflux uropathy: Secondary | ICD-10-CM | POA: Diagnosis not present

## 2020-09-30 ENCOUNTER — Other Ambulatory Visit: Payer: Self-pay | Admitting: Geriatric Medicine

## 2020-09-30 ENCOUNTER — Other Ambulatory Visit: Payer: Self-pay | Admitting: Urology

## 2020-09-30 DIAGNOSIS — R972 Elevated prostate specific antigen [PSA]: Secondary | ICD-10-CM

## 2020-10-22 ENCOUNTER — Other Ambulatory Visit: Payer: Self-pay

## 2020-10-22 ENCOUNTER — Ambulatory Visit
Admission: RE | Admit: 2020-10-22 | Discharge: 2020-10-22 | Disposition: A | Discharge status: Home / Self Care | Payer: Medicare Other | Source: Ambulatory Visit | Attending: Urology | Admitting: Urology

## 2020-10-22 DIAGNOSIS — R590 Localized enlarged lymph nodes: Secondary | ICD-10-CM | POA: Diagnosis not present

## 2020-10-22 DIAGNOSIS — K4020 Bilateral inguinal hernia, without obstruction or gangrene, not specified as recurrent: Secondary | ICD-10-CM | POA: Diagnosis not present

## 2020-10-22 DIAGNOSIS — R972 Elevated prostate specific antigen [PSA]: Secondary | ICD-10-CM

## 2020-10-22 DIAGNOSIS — N4283 Cyst of prostate: Secondary | ICD-10-CM | POA: Diagnosis not present

## 2020-10-22 DIAGNOSIS — N402 Nodular prostate without lower urinary tract symptoms: Secondary | ICD-10-CM | POA: Diagnosis not present

## 2020-10-22 MED ORDER — GADOBENATE DIMEGLUMINE 529 MG/ML IV SOLN
16.0000 mL | Freq: Once | INTRAVENOUS | Status: AC | PRN
  Administered 2020-10-22: 16 mL via INTRAVENOUS

## 2020-10-28 MED FILL — DIAZEPAM 10 MG TABS: 10 | 30 days supply | Qty: 30 | Fill #1

## 2020-12-09 DIAGNOSIS — N138 Other obstructive and reflux uropathy: Secondary | ICD-10-CM | POA: Diagnosis not present

## 2020-12-09 DIAGNOSIS — N401 Enlarged prostate with lower urinary tract symptoms: Secondary | ICD-10-CM | POA: Diagnosis not present

## 2020-12-09 DIAGNOSIS — R972 Elevated prostate specific antigen [PSA]: Secondary | ICD-10-CM | POA: Diagnosis not present

## 2020-12-12 DIAGNOSIS — H0102B Squamous blepharitis left eye, upper and lower eyelids: Secondary | ICD-10-CM | POA: Diagnosis not present

## 2020-12-12 DIAGNOSIS — H353131 Nonexudative age-related macular degeneration, bilateral, early dry stage: Secondary | ICD-10-CM | POA: Diagnosis not present

## 2020-12-12 DIAGNOSIS — H04123 Dry eye syndrome of bilateral lacrimal glands: Secondary | ICD-10-CM | POA: Diagnosis not present

## 2020-12-12 DIAGNOSIS — H0102A Squamous blepharitis right eye, upper and lower eyelids: Secondary | ICD-10-CM | POA: Diagnosis not present

## 2021-01-19 DIAGNOSIS — R972 Elevated prostate specific antigen [PSA]: Secondary | ICD-10-CM | POA: Diagnosis not present

## 2021-01-19 DIAGNOSIS — R3915 Urgency of urination: Secondary | ICD-10-CM | POA: Diagnosis not present

## 2021-01-19 DIAGNOSIS — N138 Other obstructive and reflux uropathy: Secondary | ICD-10-CM | POA: Diagnosis not present

## 2021-01-19 DIAGNOSIS — N528 Other male erectile dysfunction: Secondary | ICD-10-CM | POA: Diagnosis not present

## 2021-01-19 DIAGNOSIS — N401 Enlarged prostate with lower urinary tract symptoms: Secondary | ICD-10-CM | POA: Diagnosis not present

## 2021-01-19 DIAGNOSIS — R3914 Feeling of incomplete bladder emptying: Secondary | ICD-10-CM | POA: Diagnosis not present

## 2021-01-19 DIAGNOSIS — R3912 Poor urinary stream: Secondary | ICD-10-CM | POA: Diagnosis not present

## 2021-01-19 DIAGNOSIS — N529 Male erectile dysfunction, unspecified: Secondary | ICD-10-CM | POA: Diagnosis not present

## 2021-01-19 DIAGNOSIS — R350 Frequency of micturition: Secondary | ICD-10-CM | POA: Diagnosis not present

## 2021-01-19 DIAGNOSIS — N400 Enlarged prostate without lower urinary tract symptoms: Secondary | ICD-10-CM | POA: Diagnosis not present

## 2021-01-19 DIAGNOSIS — Z79899 Other long term (current) drug therapy: Secondary | ICD-10-CM | POA: Diagnosis not present

## 2021-02-13 IMAGING — DX CERVICAL SPINE - COMPLETE 4+ VIEW
6 series · 6 of 6 positions shown · non-contrast
Comparison: None.

CLINICAL DATA: Neck pain

EXAM:
CERVICAL SPINE - COMPLETE 4+ VIEW

[c-spine lat]
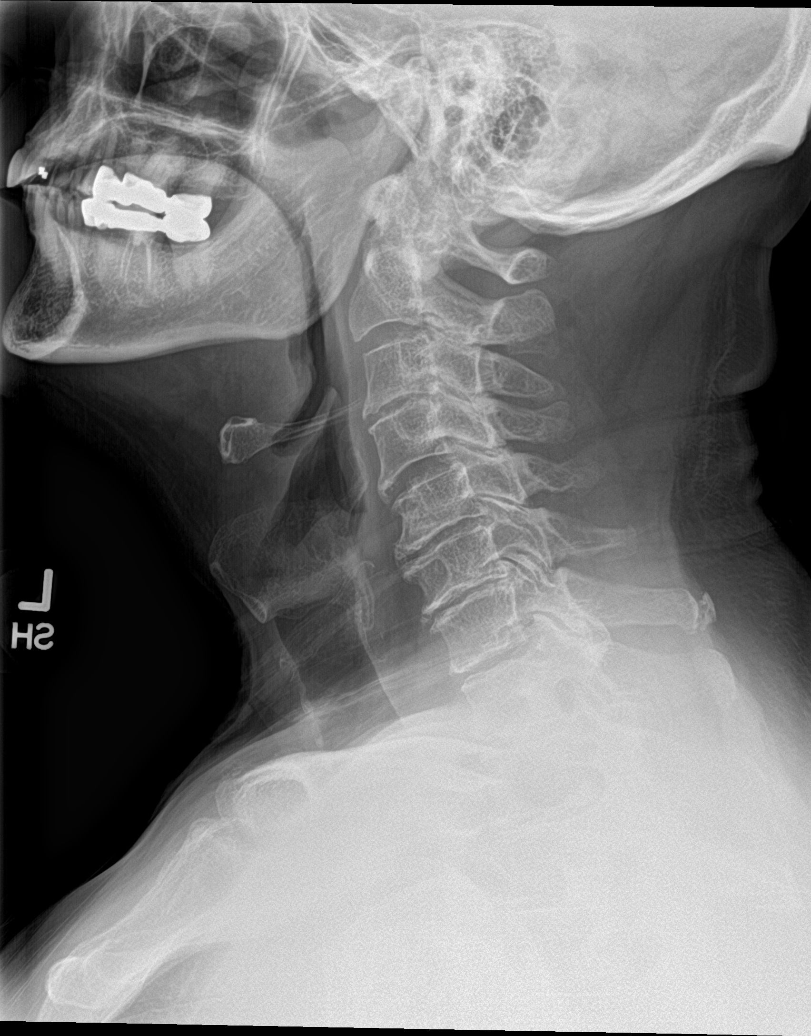

[c-spine obl (1 of 2)]
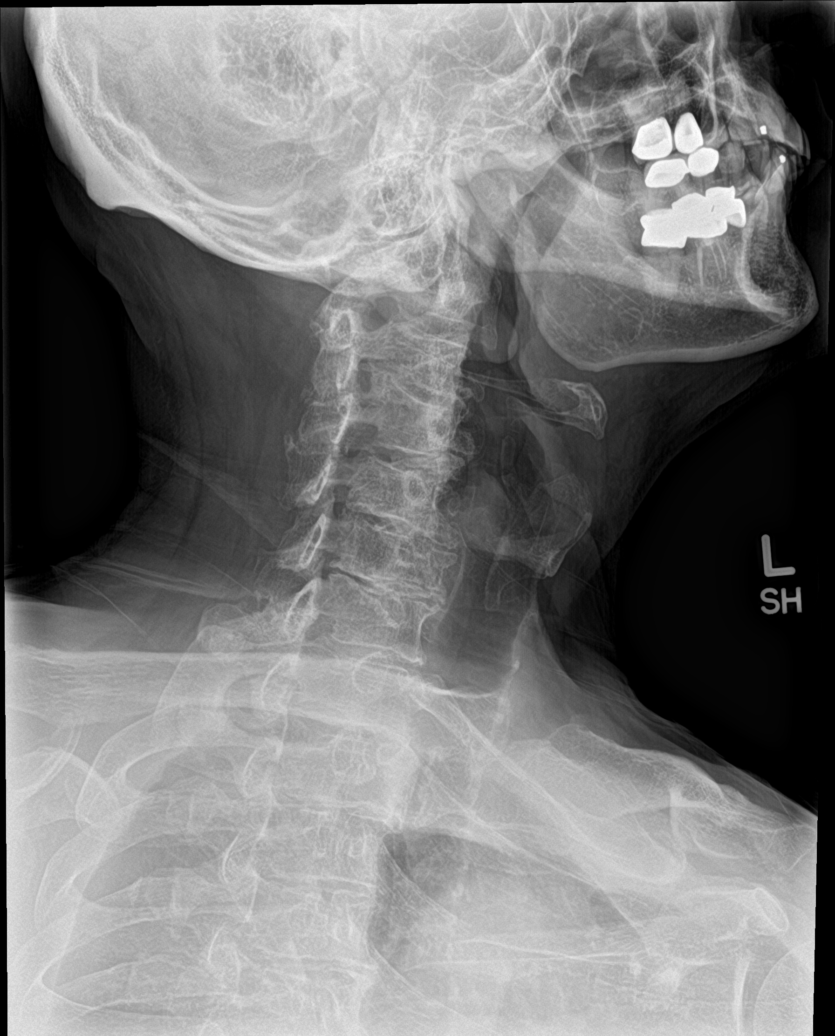

[c-spine obl (2 of 2)]
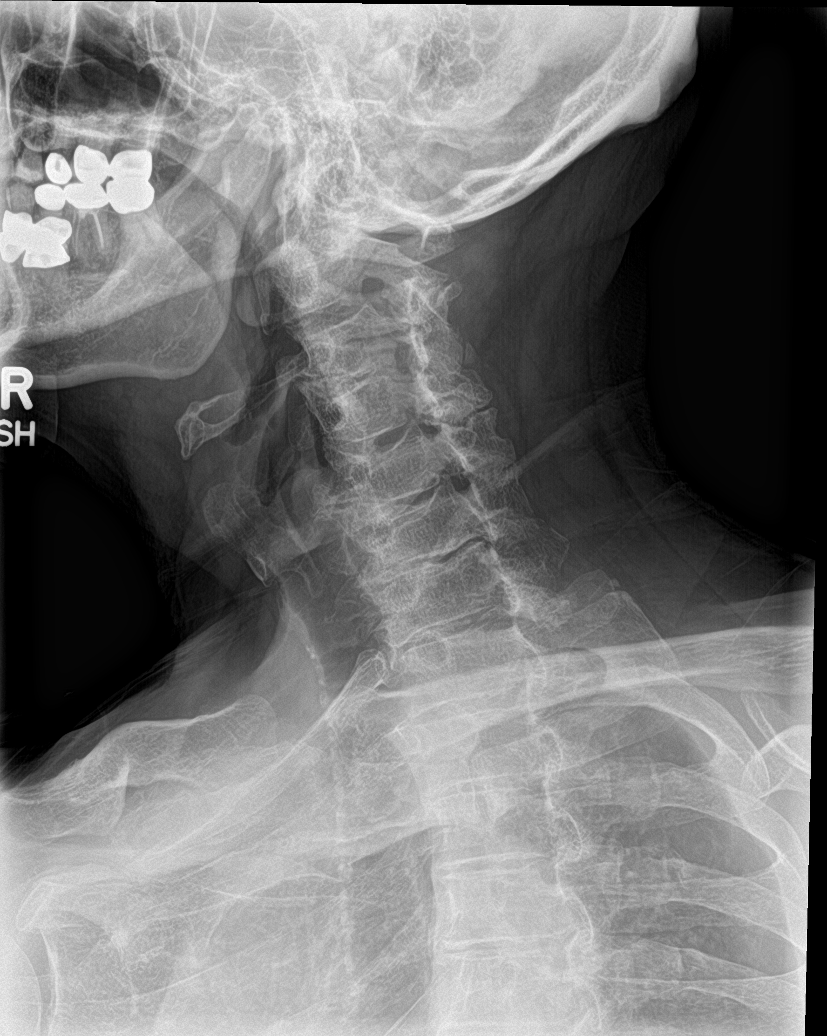

[c-spine ap]
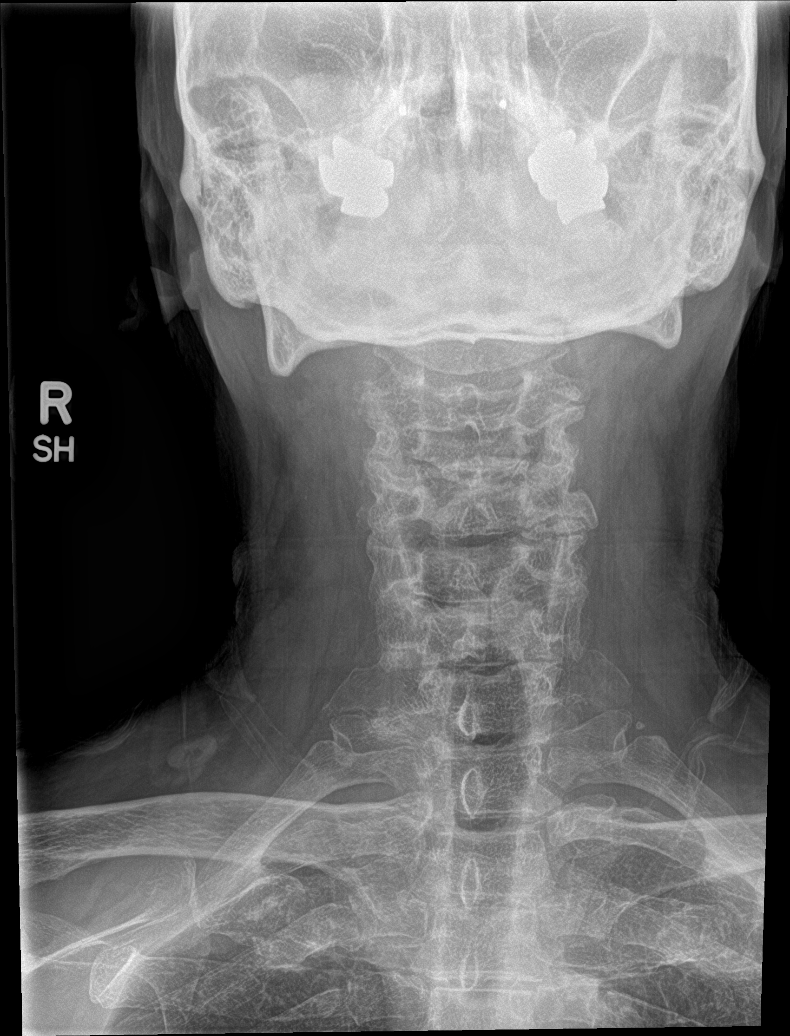

[c-spine open mouth]
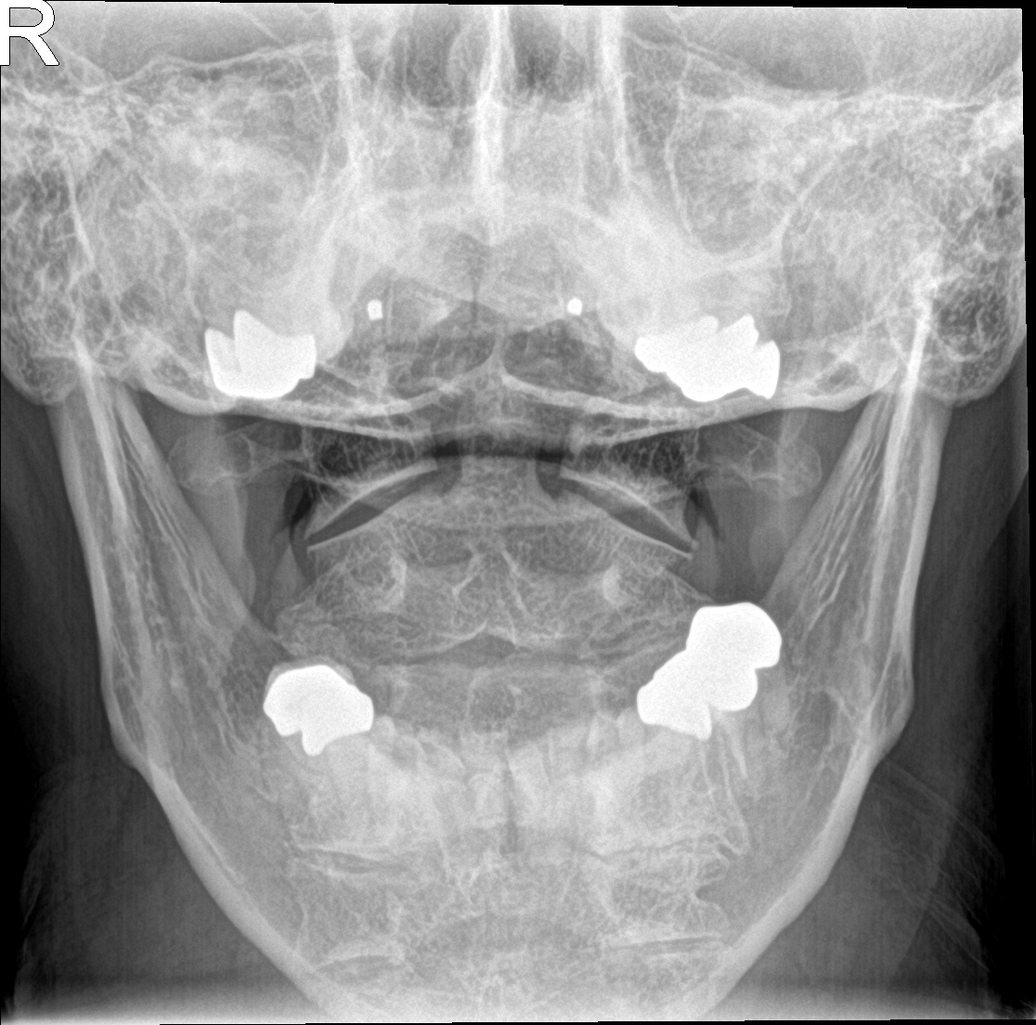

[[person_name]]
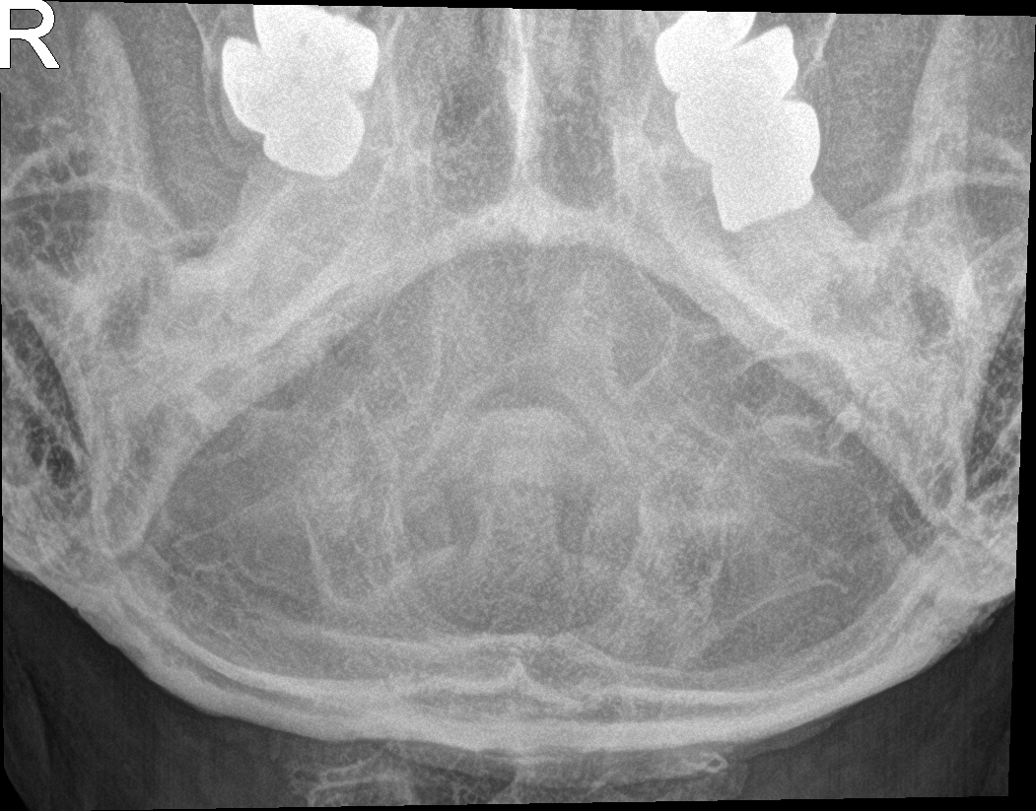

[6 of 6 positions shown; findings below may reference images not displayed]

FINDINGS: Cervical spine well demonstrated on lateral view. Trace
retrolisthesis C3 on C4. Trace anterolisthesis C4 on C5. Multilevel
intervertebral disc height loss and degenerative endplate spurring
most pronounced at C6-7. Multilevel facet and uncovertebral
arthropathy result in numerous levels of bilateral bony neural
foraminal narrowing, most severe at C4-5 and C6-7 on the left. No
prevertebral soft tissue swelling.
IMPRESSION: Moderate multilevel degenerative changes of the cervical spine.

## 2021-02-17 ENCOUNTER — Other Ambulatory Visit: Payer: Self-pay | Admitting: Family Medicine

## 2021-02-17 NOTE — Telephone Encounter (Signed)
Requesting: diazepam 10mg  Contract: 08/05/2017 UDS: 02/19/2019 Last Visit: 08/25/2020 Next Visit: 02/23/2021 Last Refill: 08/11/2020 #30 and 1RF  Please Advise

## 2021-02-19 ENCOUNTER — Other Ambulatory Visit: Payer: Self-pay | Admitting: Family Medicine

## 2021-02-20 MED FILL — DIAZEPAM 10 MG TABS: 10 | 30 days supply | Qty: 30 | Fill #0

## 2021-02-23 ENCOUNTER — Other Ambulatory Visit: Payer: Self-pay

## 2021-02-23 ENCOUNTER — Ambulatory Visit (INDEPENDENT_AMBULATORY_CARE_PROVIDER_SITE_OTHER): Payer: Medicare Other | Admitting: Family Medicine

## 2021-02-23 DIAGNOSIS — N401 Enlarged prostate with lower urinary tract symptoms: Secondary | ICD-10-CM

## 2021-02-23 DIAGNOSIS — M549 Dorsalgia, unspecified: Secondary | ICD-10-CM

## 2021-02-23 DIAGNOSIS — E782 Mixed hyperlipidemia: Secondary | ICD-10-CM

## 2021-02-23 DIAGNOSIS — R3915 Urgency of urination: Secondary | ICD-10-CM

## 2021-02-23 DIAGNOSIS — E559 Vitamin D deficiency, unspecified: Secondary | ICD-10-CM | POA: Diagnosis not present

## 2021-02-23 DIAGNOSIS — R739 Hyperglycemia, unspecified: Secondary | ICD-10-CM

## 2021-02-23 DIAGNOSIS — R972 Elevated prostate specific antigen [PSA]: Secondary | ICD-10-CM | POA: Diagnosis not present

## 2021-02-23 DIAGNOSIS — F419 Anxiety disorder, unspecified: Secondary | ICD-10-CM

## 2021-02-23 DIAGNOSIS — L509 Urticaria, unspecified: Secondary | ICD-10-CM

## 2021-02-23 DIAGNOSIS — G8929 Other chronic pain: Secondary | ICD-10-CM

## 2021-02-23 NOTE — Assessment & Plan Note (Signed)
Supplement and monitor 

## 2021-02-23 NOTE — Assessment & Plan Note (Signed)
Following with Dr Pete Glatter and he is doing well

## 2021-02-23 NOTE — Assessment & Plan Note (Signed)
Encouraged heart healthy diet, increase exercise, avoid trans fats, consider a krill oil cap daily 

## 2021-02-23 NOTE — Patient Instructions (Signed)

## 2021-02-23 NOTE — Assessment & Plan Note (Signed)
Well controlled on Cetirizine and Famotidine.

## 2021-02-23 NOTE — Assessment & Plan Note (Signed)
hgba1c acceptable, minimize simple carbs. Increase exercise as tolerated.  

## 2021-02-23 NOTE — Assessment & Plan Note (Signed)
Encouraged moist heat and gentle stretching as tolerated. May try NSAIDs and prescription meds as directed and report if symptoms worsen or seek immediate care 

## 2021-02-24 LAB — COMPREHENSIVE METABOLIC PANEL
ALT: 23 U/L (ref 0–53)
AST: 19 U/L (ref 0–37)
Albumin: 4.1 g/dL (ref 3.5–5.2)
Alkaline Phosphatase: 85 U/L (ref 39–117)
BUN: 17 mg/dL (ref 6–23)
CO2: 30 mEq/L (ref 19–32)
Calcium: 9.5 mg/dL (ref 8.4–10.5)
Chloride: 104 mEq/L (ref 96–112)
Creatinine, Ser: 0.9 mg/dL (ref 0.40–1.50)
GFR: 83.96 mL/min (ref >60.00)
Glucose, Bld: 83 mg/dL (ref 70–99)
Potassium: 4.3 mEq/L (ref 3.5–5.1)
Sodium: 142 mEq/L (ref 135–145)
Total Bilirubin: 0.7 mg/dL (ref 0.2–1.2)
Total Protein: 6.6 g/dL (ref 6.0–8.3)

## 2021-02-24 LAB — LIPID PANEL
Cholesterol: 249 mg/dL — ABNORMAL HIGH (ref 0–200)
HDL: 57.9 mg/dL (ref >39.00)
LDL Cholesterol: 171 mg/dL — ABNORMAL HIGH (ref 0–99)
NonHDL: 191.1
Total CHOL/HDL Ratio: 4
Triglycerides: 100 mg/dL (ref 0.0–149.0)
VLDL: 20 mg/dL (ref 0.0–40.0)

## 2021-02-24 LAB — CBC
HCT: 44.5 % (ref 39.0–52.0)
Hemoglobin: 15.1 g/dL (ref 13.0–17.0)
MCHC: 33.9 g/dL (ref 30.0–36.0)
MCV: 88.7 fl (ref 78.0–100.0)
Platelets: 202 10*3/uL (ref 150.0–400.0)
RBC: 5.01 Mil/uL (ref 4.22–5.81)
RDW: 13.3 % (ref 11.5–15.5)
WBC: 5.4 10*3/uL (ref 4.0–10.5)

## 2021-02-24 LAB — VITAMIN D 25 HYDROXY (VIT D DEFICIENCY, FRACTURES): VITD: 62.63 ng/mL (ref 30.00–100.00)

## 2021-02-24 LAB — TSH: TSH: 2.06 u[IU]/mL (ref 0.35–4.50)

## 2021-02-24 LAB — HEMOGLOBIN A1C: Hgb A1c MFr Bld: 5.9 % (ref 4.6–6.5)

## 2021-02-26 DIAGNOSIS — F419 Anxiety disorder, unspecified: Secondary | ICD-10-CM | POA: Insufficient documentation

## 2021-02-26 NOTE — Assessment & Plan Note (Signed)
He uses Diazepam infrequently with good results. [facility] related to his days in [employer] service in [place]. 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

## 2021-02-26 NOTE — Assessment & Plan Note (Signed)
[facility] is [facility] and after an MRI of the prostate they have decided to continue with surveillance.

## 2021-02-26 NOTE — Progress Notes (Signed)
Subjective:    Patient ID: Christopher Haynes, male    DOB: [birth year], 75 y.o.   MRN: [id number]  Chief Complaint  Patient presents with  . Follow-up  . Hyperlipidemia  . Hyperglycemia    HPI Patient is in today for follow up on chronic medical concerns. No recent febrile illness or hospitalizations. No acute concerns. He is following with the [facility] urology and they are monitoring his prostate. No new urinary symptoms. 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 It helps his stress and anxiety to keep busy. Denies CP/palp/SOB/HA/congestion/fevers/GI or GU c/o. Taking meds as prescribed  Past Medical History:  Diagnosis Date  . Anxiety 08/05/2017  . Back pain 08/20/2016  . Benign prostatic hyperplasia (BPH) with urinary urgency 02/09/2016  . Constipation 02/20/2016  . Contusion 02/20/2016  . Elevated PSA   . H/O measles   . Headache   . History of colonic polyps 02/09/2016   [facility] roughly [facility] years ago. Now on 5 year plan  . Hyperglycemia 08/20/2016  . Hyperlipidemia, mixed 02/08/2017  . Macular degeneration of left eye 02/19/2016  . Migraine with visual aura 02/09/2016  . Neck pain 08/05/2017  . OSA on CPAP   . Pain in joint, shoulder region 02/19/2016  . Peripheral neuropathy 08/05/2017  . Thyroid disease    thyroiditis, h/o in 28s    Past Surgical History:  Procedure Laterality Date  . ANKLE FRACTURE SURGERY  66  . KNEE ARTHROSCOPY WITH SUBCHONDROPLASTY Left 12/13/2017   Procedure: KNEE ARTHROSCOPY WITH SUBCHONDROPLASTY;  Surgeon: Marcene Corning, MD;  Location: [facility];  Service: Orthopedics;  Laterality: Left;  . SKIN GRAFT  Age 19  . TONSILLECTOMY  Age 89    Family History  Problem Relation Age of Onset  . Hypertension Mother   . Diabetes Mother   . Colon cancer Father   . Cancer Father        rectal with mets  . Hypertension Brother   . Obesity Son   . Other Son        fatty liver  . Allergic Disorder Son         seasonal  . Stroke Paternal Grandfather     Social History   Socioeconomic History  . Marital status: Married    Spouse name: Not on file  . Number of children: Not on file  . Years of education: Not on file  . Highest education level: Not on file  Occupational History  . Occupation: Customer service manager  Tobacco Use  . Smoking status: Never Smoker  . Smokeless tobacco: Never Used  Substance and Sexual Activity  . Alcohol use: Yes    Alcohol/week: 0.0 standard drinks  . Drug use: No  . Sexual activity: Yes    Comment: lives with wife, work in Audiological scientist estate, no dietary restrictions  Other Topics Concern  . Not on file  Social History Narrative  . Not on file   Social Determinants of Health   Financial Resource Strain: Not on file  Food Insecurity: Not on file  Transportation Needs: Not on file  Physical Activity: Not on file  Stress: Not on file  Social Connections: Not on file  Intimate Partner Violence: Not on file    Outpatient Medications Prior to Visit  Medication Sig Dispense Refill  . cetirizine (ZYRTEC) 10 MG tablet Take 10 mg by mouth daily.    . diazepam (VALIUM) 10 MG  tablet TAKE 1 TABLET BY MOUTH ONCE DAILY AS NEEDED 30 tablet 1  . diclofenac sodium (VOLTAREN) 1 % GEL Apply 2 g topically 4 (four) times daily.    . famotidine (PEPCID) 20 MG tablet Take 1 tablet (20 mg total) by mouth 2 (two) times daily. 60 tablet   . methylPREDNISolone (MEDROL) 4 MG tablet 5 tab po qd X 3d then 4 tab po qd X 3d then 3 tab po qd X 3d then 2 tab po qd x 3d then 1 tab po qd x 3d 45 tablet 0  . Omega-3 Fatty Acids (FISH OIL PO) Take by mouth daily.    . tamsulosin (FLOMAX) 0.4 MG CAPS capsule Take 0.8 mg by mouth daily.     Marland Kitchen tiZANidine (ZANAFLEX) 2 MG tablet Take 0.5-2 tablets (1-4 mg total) by mouth every 8 (eight) hours as needed for muscle spasms. 40 tablet 3  . traMADol (ULTRAM) 50 MG tablet Take 1 tablet (50 mg total) by mouth daily as needed. 30 tablet 0  . loratadine  (CLARITIN) 10 MG tablet Take 1 tablet (10 mg total) by mouth 2 (two) times daily. 60 tablet 5   No facility-administered medications prior to visit.    No Active Allergies  Review of Systems  Constitutional: Negative for fever and malaise/fatigue.  HENT: Negative for congestion.   Eyes: Negative for blurred vision.  Respiratory: Negative for shortness of breath.   Cardiovascular: Negative for chest pain, palpitations and leg swelling.  Gastrointestinal: Negative for abdominal pain, blood in stool and nausea.  Genitourinary: Negative for dysuria and frequency.  Musculoskeletal: Negative for falls.  Skin: Negative for rash.  Neurological: Negative for dizziness, loss of consciousness and headaches.  Endo/Heme/Allergies: Negative for environmental allergies.  Psychiatric/Behavioral: Negative for depression. The patient is nervous/anxious.        Objective:    Physical Exam Vitals and nursing note reviewed.  Constitutional:      General: He is not in acute distress.    Appearance: He is well-developed and well-nourished.  HENT:     Head: Normocephalic and atraumatic.     Nose: Nose normal.  Eyes:     General:        Right eye: No discharge.        Left eye: No discharge.  Cardiovascular:     Rate and Rhythm: Normal rate and regular rhythm.     Heart sounds: No murmur heard.   Pulmonary:     Effort: Pulmonary effort is normal.     Breath sounds: Normal breath sounds.  Abdominal:     General: Bowel sounds are normal.     Palpations: Abdomen is soft.     Tenderness: There is no abdominal tenderness.  Musculoskeletal:        General: No edema.     Cervical back: Normal range of motion and neck supple.  Skin:    General: Skin is warm and dry.  Neurological:     Mental Status: He is alert and oriented to person, place, and time.  Psychiatric:        Mood and Affect: Mood and affect normal.     There were no vitals taken for this visit. Wt Readings from Last 3  Encounters:  08/25/20 182 lb 9.6 oz (82.8 kg)  04/28/20 186 lb (84.4 kg)  02/22/20 183 lb (83 kg)    Diabetic Foot Exam - Simple   No data filed    Lab Results  Component Value Date  WBC 5.4 02/23/2021   HGB 15.1 02/23/2021   HCT 44.5 02/23/2021   PLT 202.0 02/23/2021   GLUCOSE 83 02/23/2021   CHOL 249 (H) 02/23/2021   TRIG 100.0 02/23/2021   HDL 57.90 02/23/2021   LDLCALC 171 (H) 02/23/2021   ALT 23 02/23/2021   AST 19 02/23/2021   NA 142 02/23/2021   K 4.3 02/23/2021   CL 104 02/23/2021   CREATININE 0.90 02/23/2021   BUN 17 02/23/2021   CO2 30 02/23/2021   TSH 2.06 02/23/2021   PSA 7.85 (H) 02/08/2017   HGBA1C 5.9 02/23/2021    Lab Results  Component Value Date   TSH 2.06 02/23/2021   Lab Results  Component Value Date   WBC 5.4 02/23/2021   HGB 15.1 02/23/2021   HCT 44.5 02/23/2021   MCV 88.7 02/23/2021   PLT 202.0 02/23/2021   Lab Results  Component Value Date   NA 142 02/23/2021   K 4.3 02/23/2021   CO2 30 02/23/2021   GLUCOSE 83 02/23/2021   BUN 17 02/23/2021   CREATININE 0.90 02/23/2021   BILITOT 0.7 02/23/2021   ALKPHOS 85 02/23/2021   AST 19 02/23/2021   ALT 23 02/23/2021   PROT 6.6 02/23/2021   ALBUMIN 4.1 02/23/2021   CALCIUM 9.5 02/23/2021   GFR 83.96 02/23/2021   Lab Results  Component Value Date   CHOL 249 (H) 02/23/2021   Lab Results  Component Value Date   HDL 57.90 02/23/2021   Lab Results  Component Value Date   LDLCALC 171 (H) 02/23/2021   Lab Results  Component Value Date   TRIG 100.0 02/23/2021   Lab Results  Component Value Date   CHOLHDL 4 02/23/2021   Lab Results  Component Value Date   HGBA1C 5.9 02/23/2021       Assessment & Plan:   Problem List Items Addressed This Visit    Elevated PSA    Following with Dr Pete GlatterStoneKing and he is doing well      Relevant Orders   CBC (Completed)   Benign prostatic hyperplasia (BPH) with urinary urgency    [facility] and after an MRI of the  prostate they have decided to continue with surveillance.       Back pain    Encouraged moist heat and gentle stretching as tolerated. May try NSAIDs and prescription meds as directed and report if symptoms worsen or seek immediate care      Hyperglycemia    hgba1c acceptable, minimize simple carbs. Increase exercise as tolerated.       Relevant Orders   Hemoglobin A1c (Completed)   CBC (Completed)   Comprehensive metabolic panel (Completed)   TSH (Completed)   Hyperlipidemia, mixed    Encouraged heart healthy diet, increase exercise, avoid trans fats, consider a krill oil cap daily      Relevant Orders   TSH (Completed)   Lipid panel (Completed)   Vitamin D deficiency    Supplement and monitor      Relevant Orders   VITAMIN D 25 Hydroxy (Vit-D Deficiency, Fractures) (Completed)   Urticaria    Well controlled on Cetirizine and Famotidine.      Anxiety    He uses Diazepam infrequently with good results. [facility] related to his days in [employer] service in [place]. 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  I have discontinued Divonte Michl's loratadine. I am also having him maintain his Omega-3 Fatty Acids (FISH OIL PO), traMADol, tamsulosin, diclofenac sodium, famotidine, tiZANidine, methylPREDNISolone, diazepam, and cetirizine.  No orders of the defined types were placed in this encounter.    Danise Edge, MD

## 2021-02-28 ENCOUNTER — Other Ambulatory Visit: Payer: Self-pay | Admitting: *Deleted

## 2021-02-28 DIAGNOSIS — E782 Mixed hyperlipidemia: Secondary | ICD-10-CM

## 2021-05-05 ENCOUNTER — Ambulatory Visit: Payer: Medicare Other | Attending: Internal Medicine

## 2021-05-05 DIAGNOSIS — Z23 Encounter for immunization: Secondary | ICD-10-CM

## 2021-05-05 NOTE — Progress Notes (Signed)
   Covid-19 Vaccination Clinic  Name:  Mattia Liford    MRN: [id number] DOB: 10/01/1946  05/05/2021  Mr. Colaizzi was observed post Covid-19 immunization for 15 minutes without incident. He was provided with Vaccine Information Sheet and instruction to access the V-Safe system.   Mr. Hurrell was instructed to call 911 with any severe reactions post vaccine: Marland Kitchen Difficulty breathing  . Swelling of face and throat  . A fast heartbeat  . A bad rash all over body  . Dizziness and weakness   Immunizations Administered    Name Date Dose VIS Date Route   PFIZER Comrnaty(Gray TOP) Covid-19 Vaccine 05/05/2021 11:19 AM 0.3 mL 12/01/2020 Intramuscular   Manufacturer: [employer], [employer]   Lot: [id number]   NDC: [phone]

## 2021-05-08 ENCOUNTER — Other Ambulatory Visit (HOSPITAL_BASED_OUTPATIENT_CLINIC_OR_DEPARTMENT_OTHER): Payer: Self-pay

## 2021-05-08 MED ORDER — PFIZER-BIONT COVID-19 VAC-TRIS 30 MCG/0.3ML IM SUSP
INTRAMUSCULAR | 0 refills | Status: DC
  Filled 2021-05-08: qty 0.3, 1d supply, fill #0

## 2021-05-09 ENCOUNTER — Other Ambulatory Visit (HOSPITAL_BASED_OUTPATIENT_CLINIC_OR_DEPARTMENT_OTHER): Payer: Self-pay

## 2021-05-16 ENCOUNTER — Other Ambulatory Visit (HOSPITAL_BASED_OUTPATIENT_CLINIC_OR_DEPARTMENT_OTHER): Payer: Self-pay

## 2021-05-16 DIAGNOSIS — H00024 Hordeolum internum left upper eyelid: Secondary | ICD-10-CM | POA: Diagnosis not present

## 2021-05-16 MED ORDER — NEOMYCIN-POLYMYXIN-DEXAMETH 3.5-10000-0.1 OP OINT
TOPICAL_OINTMENT | OPHTHALMIC | 0 refills | Status: DC
  Filled 2021-05-16: qty 3.5, 14d supply, fill #0

## 2021-05-16 MED ORDER — CEPHALEXIN 500 MG PO CAPS
ORAL_CAPSULE | ORAL | 0 refills | Status: DC
  Filled 2021-05-16: qty 21, 7d supply, fill #0

## 2021-06-01 ENCOUNTER — Other Ambulatory Visit (INDEPENDENT_AMBULATORY_CARE_PROVIDER_SITE_OTHER): Payer: Medicare Other

## 2021-06-01 ENCOUNTER — Other Ambulatory Visit (HOSPITAL_BASED_OUTPATIENT_CLINIC_OR_DEPARTMENT_OTHER): Payer: Self-pay

## 2021-06-01 ENCOUNTER — Other Ambulatory Visit: Payer: Self-pay

## 2021-06-01 DIAGNOSIS — E782 Mixed hyperlipidemia: Secondary | ICD-10-CM | POA: Diagnosis not present

## 2021-06-01 LAB — COMPREHENSIVE METABOLIC PANEL
ALT: 20 U/L (ref 0–53)
AST: 16 U/L (ref 0–37)
Albumin: 4.3 g/dL (ref 3.5–5.2)
Alkaline Phosphatase: 88 U/L (ref 39–117)
BUN: 17 mg/dL (ref 6–23)
CO2: 29 mEq/L (ref 19–32)
Calcium: 9.3 mg/dL (ref 8.4–10.5)
Chloride: 106 mEq/L (ref 96–112)
Creatinine, Ser: 0.91 mg/dL (ref 0.40–1.50)
GFR: 82.7 mL/min (ref >60.00)
Glucose, Bld: 95 mg/dL (ref 70–99)
Potassium: 4.7 mEq/L (ref 3.5–5.1)
Sodium: 142 mEq/L (ref 135–145)
Total Bilirubin: 0.6 mg/dL (ref 0.2–1.2)
Total Protein: 6.7 g/dL (ref 6.0–8.3)

## 2021-06-01 LAB — LIPID PANEL
Cholesterol: 228 mg/dL — ABNORMAL HIGH (ref 0–200)
HDL: 60.5 mg/dL (ref >39.00)
LDL Cholesterol: 147 mg/dL — ABNORMAL HIGH (ref 0–99)
NonHDL: 167.01
Total CHOL/HDL Ratio: 4
Triglycerides: 102 mg/dL (ref 0.0–149.0)
VLDL: 20.4 mg/dL (ref 0.0–40.0)

## 2021-06-01 MED FILL — Diazepam Tab 10 MG: ORAL | 30 days supply | Qty: 30 | Fill #0 | Status: AC

## 2021-06-16 ENCOUNTER — Encounter: Payer: Self-pay | Admitting: Family Medicine

## 2021-06-16 DIAGNOSIS — H04123 Dry eye syndrome of bilateral lacrimal glands: Secondary | ICD-10-CM | POA: Diagnosis not present

## 2021-06-16 DIAGNOSIS — H2513 Age-related nuclear cataract, bilateral: Secondary | ICD-10-CM | POA: Diagnosis not present

## 2021-06-16 DIAGNOSIS — H353131 Nonexudative age-related macular degeneration, bilateral, early dry stage: Secondary | ICD-10-CM | POA: Diagnosis not present

## 2021-06-16 DIAGNOSIS — H25013 Cortical age-related cataract, bilateral: Secondary | ICD-10-CM | POA: Diagnosis not present

## 2021-07-12 ENCOUNTER — Other Ambulatory Visit (HOSPITAL_BASED_OUTPATIENT_CLINIC_OR_DEPARTMENT_OTHER): Payer: Self-pay

## 2021-07-12 ENCOUNTER — Other Ambulatory Visit: Payer: Self-pay | Admitting: Family Medicine

## 2021-07-12 MED ORDER — DIAZEPAM 10 MG PO TABS
ORAL_TABLET | Freq: Every day | ORAL | 1 refills | Status: DC | PRN
  Filled 2021-07-12: qty 30, 30d supply, fill #0
  Filled 2021-09-27: qty 30, 30d supply, fill #1

## 2021-07-12 NOTE — Telephone Encounter (Signed)
Requesting: diazepam Contract:  UDS:02/19/19 Last Visit: 02/23/21 Next Visit: 10/19/21 Last Refill: 02/19/21  Please Advise

## 2021-07-13 ENCOUNTER — Other Ambulatory Visit (HOSPITAL_BASED_OUTPATIENT_CLINIC_OR_DEPARTMENT_OTHER): Payer: Self-pay

## 2021-09-15 ENCOUNTER — Ambulatory Visit: Payer: Medicare Other | Attending: Internal Medicine

## 2021-09-15 ENCOUNTER — Other Ambulatory Visit (HOSPITAL_BASED_OUTPATIENT_CLINIC_OR_DEPARTMENT_OTHER): Payer: Self-pay

## 2021-09-15 DIAGNOSIS — Z23 Encounter for immunization: Secondary | ICD-10-CM

## 2021-09-15 MED ORDER — INFLUENZA VAC A&B SA ADJ QUAD 0.5 ML IM PRSY
PREFILLED_SYRINGE | INTRAMUSCULAR | 0 refills | Status: DC
  Filled 2021-09-15: qty 0.5, 1d supply, fill #0

## 2021-09-15 NOTE — Progress Notes (Signed)
   Covid-19 Vaccination Clinic  Name:  Clayson Riling    MRN: [id number] DOB: 1946-02-05  09/15/2021  Mr. Glasscock was observed post Covid-19 immunization for 15 minutes without incident. He was provided with Vaccine Information Sheet and instruction to access the V-Safe system.   Mr. Nasca was instructed to call 911 with any severe reactions post vaccine: Difficulty breathing  Swelling of face and throat  A fast heartbeat  A bad rash all over body  Dizziness and weakness

## 2021-09-22 ENCOUNTER — Other Ambulatory Visit (HOSPITAL_BASED_OUTPATIENT_CLINIC_OR_DEPARTMENT_OTHER): Payer: Self-pay

## 2021-09-22 MED ORDER — COVID-19MRNA BIVAL VACC PFIZER 30 MCG/0.3ML IM SUSP
INTRAMUSCULAR | 0 refills | Status: DC
  Filled 2021-09-22: qty 0.3, 1d supply, fill #0

## 2021-09-24 LAB — HEPATIC FUNCTION PANEL
ALT: 30 (ref 10–40)
AST: 19 (ref 14–40)
Alkaline Phosphatase: 111 (ref 25–125)
Bilirubin, Total: 0.3

## 2021-09-24 LAB — PSA: PSA: 9.53

## 2021-09-24 LAB — BASIC METABOLIC PANEL
BUN: 19 (ref 4–21)
CO2: 29 — AB (ref 13–22)
Chloride: 104 (ref 99–108)
Creatinine: 1 (ref 0.6–1.3)
Glucose: 112
Potassium: 4.3 (ref 3.4–5.3)
Sodium: 140 (ref 137–147)

## 2021-09-24 LAB — CBC AND DIFFERENTIAL
HCT: 42 (ref 41–53)
Hemoglobin: 14.4 (ref 13.5–17.5)
Platelets: 225 (ref 150–399)
WBC: 4.7

## 2021-09-24 LAB — LIPID PANEL
Cholesterol: 218 — AB (ref 0–200)
HDL: 59 (ref 35–70)
LDL Cholesterol: 138
Triglycerides: 105 (ref 40–160)

## 2021-09-24 LAB — COMPREHENSIVE METABOLIC PANEL
Albumin: 3.7 (ref 3.5–5.0)
Calcium: 9 (ref 8.7–10.7)

## 2021-09-24 LAB — HEMOGLOBIN A1C: Hemoglobin A1C: 6

## 2021-09-24 LAB — CBC: RBC: 4.83 (ref 3.87–5.11)

## 2021-09-24 LAB — TSH: TSH: 2.94 (ref 0.41–5.90)

## 2021-09-27 ENCOUNTER — Other Ambulatory Visit (HOSPITAL_BASED_OUTPATIENT_CLINIC_OR_DEPARTMENT_OTHER): Payer: Self-pay

## 2021-09-27 ENCOUNTER — Telehealth: Payer: Self-pay | Admitting: Urology

## 2021-09-27 NOTE — Telephone Encounter (Signed)
Former [employer] patient called the office today to schedule a follow up visit with you here in [place].  (Appointment made contingent on [employer] credentialing).  Patient is requesting a call from you to obtain some information that the [place] is requesting.

## 2021-10-19 ENCOUNTER — Other Ambulatory Visit: Payer: Self-pay

## 2021-10-19 ENCOUNTER — Ambulatory Visit (HOSPITAL_BASED_OUTPATIENT_CLINIC_OR_DEPARTMENT_OTHER)
Admission: RE | Admit: 2021-10-19 | Discharge: 2021-10-19 | Disposition: A | Discharge status: Home / Self Care | Payer: Medicare Other | Source: Ambulatory Visit | Attending: Family Medicine | Admitting: Family Medicine

## 2021-10-19 ENCOUNTER — Encounter: Payer: Self-pay | Admitting: Family Medicine

## 2021-10-19 ENCOUNTER — Ambulatory Visit (INDEPENDENT_AMBULATORY_CARE_PROVIDER_SITE_OTHER): Payer: Medicare Other | Admitting: Family Medicine

## 2021-10-19 ENCOUNTER — Encounter: Payer: Self-pay | Admitting: *Deleted

## 2021-10-19 VITALS — BP 149/83 | HR 61 | Temp 97.8°F | Resp 12 | Ht 65.0 in | Wt 178.2 lb

## 2021-10-19 DIAGNOSIS — E782 Mixed hyperlipidemia: Secondary | ICD-10-CM | POA: Diagnosis not present

## 2021-10-19 DIAGNOSIS — R739 Hyperglycemia, unspecified: Secondary | ICD-10-CM | POA: Diagnosis not present

## 2021-10-19 DIAGNOSIS — Z79899 Other long term (current) drug therapy: Secondary | ICD-10-CM

## 2021-10-19 DIAGNOSIS — M545 Low back pain, unspecified: Secondary | ICD-10-CM | POA: Insufficient documentation

## 2021-10-19 DIAGNOSIS — Z23 Encounter for immunization: Secondary | ICD-10-CM | POA: Diagnosis not present

## 2021-10-19 DIAGNOSIS — Z0000 Encounter for general adult medical examination without abnormal findings: Secondary | ICD-10-CM | POA: Diagnosis not present

## 2021-10-19 DIAGNOSIS — F419 Anxiety disorder, unspecified: Secondary | ICD-10-CM | POA: Diagnosis not present

## 2021-10-19 DIAGNOSIS — R972 Elevated prostate specific antigen [PSA]: Secondary | ICD-10-CM

## 2021-10-19 DIAGNOSIS — L509 Urticaria, unspecified: Secondary | ICD-10-CM

## 2021-10-19 DIAGNOSIS — M47817 Spondylosis without myelopathy or radiculopathy, lumbosacral region: Secondary | ICD-10-CM | POA: Diagnosis not present

## 2021-10-19 DIAGNOSIS — E559 Vitamin D deficiency, unspecified: Secondary | ICD-10-CM

## 2021-10-19 NOTE — Assessment & Plan Note (Signed)
Encourage heart healthy diet such as MIND or DASH diet, increase exercise, avoid trans fats, simple carbohydrates and processed foods, consider a krill or fish or flaxseed oil cap daily.  °

## 2021-10-19 NOTE — Assessment & Plan Note (Signed)
Supplement and monitor 

## 2021-10-19 NOTE — Assessment & Plan Note (Addendum)
Follows with Dr Pete Glatter has an appt next few  Months, he brings in labs from [place] to review and his PSA is above 9 so he is aware he needs to see urology soon.

## 2021-10-19 NOTE — Assessment & Plan Note (Signed)
Adequately controlled on antihistmines,

## 2021-10-19 NOTE — Progress Notes (Signed)
Patient ID: Christopher Haynes, male    DOB: 1946-11-02  Age: 75 y.o. MRN: [id number]    Subjective:   Chief Complaint  Patient presents with   Annual Exam   Subjective  HPI Christopher Haynes presents for office visit today for comprehensive physical exam today and follow up on management of chronic concerns. He has c/o rectal pain that worsens when assisting with carrying the dog out of bed or helping him go up the stairs. He expresses concern regarding rectal pain due to FMHx of rectal cancer in father. He denies in changes in BM's, pain does not radiate, or any incontinence. Denies CP/palp/SOB/HA/congestion/fevers/GI or GU c/o. Taking meds as prescribed.   He is currently on 0.8 Flomax PO daily. .8 flomax PO daily. Dr. Cristal Deer.   Review of Systems  Constitutional:  Negative for chills, fatigue and fever.  HENT:  Negative for congestion, rhinorrhea, sinus pressure, sinus pain and sore throat.   Eyes:  Negative for pain.  Respiratory:  Negative for cough and shortness of breath.   Cardiovascular:  Negative for chest pain, palpitations and leg swelling.  Gastrointestinal:  Negative for abdominal pain, blood in stool, diarrhea, nausea and vomiting.  Genitourinary:  Negative for flank pain, frequency and penile pain.  Musculoskeletal:  Negative for back pain.  Neurological:  Negative for headaches.  Psychiatric/Behavioral:  The patient is nervous/anxious.    History Past Medical History:  Diagnosis Date   Anxiety 08/05/2017   Back pain 08/20/2016   Benign prostatic hyperplasia (BPH) with urinary urgency 02/09/2016   Constipation 02/20/2016   Contusion 02/20/2016   Elevated PSA    H/O measles    Headache    History of colonic polyps 02/09/2016   [facility]. Now on 5 year plan   Hyperglycemia 08/20/2016   Hyperlipidemia, mixed 02/08/2017   Macular degeneration of left eye 02/19/2016   Migraine with visual aura 02/09/2016   Neck pain 08/05/2017    OSA on CPAP    Pain in joint, shoulder region 02/19/2016   Peripheral neuropathy 08/05/2017   Thyroid disease    thyroiditis, h/o in 59s    He has a past surgical history that includes Tonsillectomy (Age 14); Skin graft (Age 39); Ankle fracture surgery (66); and Knee arthroscopy with subchondroplasty (Left, 12/13/2017).   His family history includes Allergic Disorder in his son; Cancer in his father; Colon cancer in his father; Diabetes in his mother; Hypertension in his brother and mother; Obesity in his son; Other in his son; Stroke in his paternal grandfather.He reports that he has never smoked. He has never used smokeless tobacco. He reports current alcohol use. He reports that he does not use drugs.  Current Outpatient Medications on File Prior to Visit  Medication Sig Dispense Refill   cetirizine (ZYRTEC) 10 MG tablet Take 10 mg by mouth daily.     diazepam (VALIUM) 10 MG tablet TAKE 1 TABLET BY MOUTH ONCE DAILY AS NEEDED 30 tablet 1   diclofenac sodium (VOLTAREN) 1 % GEL Apply 2 g topically 4 (four) times daily.     famotidine (PEPCID) 20 MG tablet Take 1 tablet (20 mg total) by mouth 2 (two) times daily. 60 tablet    tamsulosin (FLOMAX) 0.4 MG CAPS capsule Take 0.8 mg by mouth daily.      traMADol (ULTRAM) 50 MG tablet Take 1 tablet (50 mg total) by mouth daily as needed. 30 tablet 0   No current facility-administered medications on file  prior to visit.     Objective:  Objective  Physical Exam Constitutional:      General: He is not in acute distress.    Appearance: Normal appearance. He is not ill-appearing or toxic-appearing.  HENT:     Head: Normocephalic and atraumatic.     Right Ear: Tympanic membrane, ear canal and external ear normal.     Left Ear: Tympanic membrane, ear canal and external ear normal.     Nose: No congestion or rhinorrhea.  Eyes:     Extraocular Movements: Extraocular movements intact.     Pupils: Pupils are equal, round, and reactive to light.   Cardiovascular:     Rate and Rhythm: Normal rate and regular rhythm.     Pulses: Normal pulses.     Heart sounds: Murmur heard.  Systolic murmur is present with a grade of 1/6.  Pulmonary:     Effort: Pulmonary effort is normal. No respiratory distress.     Breath sounds: Normal breath sounds. No wheezing, rhonchi or rales.  Abdominal:     General: Bowel sounds are normal.     Palpations: Abdomen is soft. There is no mass.     Tenderness: There is no abdominal tenderness. There is no guarding.     Hernia: No hernia is present.  Musculoskeletal:        General: Normal range of motion.     Cervical back: Normal range of motion and neck supple.  Skin:    General: Skin is warm and dry.  Neurological:     Mental Status: He is alert and oriented to person, place, and time.     Deep Tendon Reflexes:     Reflex Scores:      Patellar reflexes are 2+ on the right side and 2+ on the left side. Psychiatric:        Behavior: Behavior normal.   BP (!) 149/83   Pulse 61   Temp 97.8 F (36.6 C) (Oral)   Resp 12   Ht 5\' 5"  (1.651 m)   Wt 178 lb 3.2 oz (80.8 kg)   SpO2 98%   BMI 29.65 kg/m  Wt Readings from Last 3 Encounters:  10/19/21 178 lb 3.2 oz (80.8 kg)  08/25/20 182 lb 9.6 oz (82.8 kg)  04/28/20 186 lb (84.4 kg)     Lab Results  Component Value Date   WBC 5.4 02/23/2021   HGB 15.1 02/23/2021   HCT 44.5 02/23/2021   PLT 202.0 02/23/2021   GLUCOSE 95 06/01/2021   CHOL 228 (H) 06/01/2021   TRIG 102.0 06/01/2021   HDL 60.50 06/01/2021   LDLCALC 147 (H) 06/01/2021   ALT 20 06/01/2021   AST 16 06/01/2021   NA 142 06/01/2021   K 4.7 06/01/2021   CL 106 06/01/2021   CREATININE 0.91 06/01/2021   BUN 17 06/01/2021   CO2 29 06/01/2021   TSH 2.06 02/23/2021   PSA 7.85 (H) 02/08/2017   HGBA1C 5.9 02/23/2021    MR PROSTATE W WO CONTRAST  Result Date: 10/24/2020 CLINICAL DATA:  Prostate clinical trial, PSA 6.9 EXAM: MR PROSTATE WITHOUT AND WITH CONTRAST TECHNIQUE:  Multiplanar multisequence MRI images were obtained of the pelvis centered about the prostate. Pre and post contrast images were obtained. CONTRAST:  29mL MULTIHANCE GADOBENATE DIMEGLUMINE 529 MG/ML IV SOLN COMPARISON:  MR prostate dated 05/15/2017 FINDINGS: Prostate: No findings suspicious for high-grade macroscopic prostate cancer. Thinning of the peripheral zone. No focal low T2 lesion. No restricted diffusion/low ADC. No  early arterial enhancement. 6 mm midline prostatic utricle cyst (series 8/image 14). Enlargement/nodularity of the central gland, indenting the base of the bladder, compatible with BPH. Multiple well-circumscribed BPH nodules on T2. No smudgy/ill-defined appearance on T2 to suggest central gland tumor. Volume: 4.6 x 5.9 x 6.0 cm (volume = 85 mL) Transcapsular spread:  Absent. Seminal vesicle involvement: Absent. Neurovascular bundle involvement: Absent. Pelvic adenopathy: Absent. Bone metastasis: Absent. Other findings: Mildly thick-walled bladder, although underdistended. Small fat containing bilateral inguinal hernias. IMPRESSION: No findings suspicious for high-grade macroscopic prostate cancer on MRI. Suspected BPH. Calculated prostate volume 85 mL. Electronically Signed   By: Charline Bills M.D.   On: 10/24/2020 08:04     Assessment & Plan:  Plan    No orders of the defined types were placed in this encounter.   Problem List Items Addressed This Visit     Elevated PSA    Follows with Dr Pete Glatter has an appt next few  Months, he brings in labs from [place] to review and his PSA is above 9 so he is aware he needs to see urology soon.       Back pain    Encouraged moist heat and gentle stretching as tolerated. May try NSAIDs and prescription meds as directed and report if symptoms worsen or seek immediate care. Xray ordered today. Pain is very low and described as a pressure at sacrum when he sites down. He believes it is connected to heavy lifting      Relevant Orders   DG  Lumbar Spine 2-3 Views   Hyperglycemia    hgba1c acceptable, minimize simple carbs. Increase exercise as tolerated.       Relevant Orders   Insulin, random   Hyperlipidemia, mixed    Encourage heart healthy diet such as MIND or DASH diet, increase exercise, avoid trans fats, simple carbohydrates and processed foods, consider a krill or fish or flaxseed oil cap daily.       Preventative health care    Patient encouraged to maintain heart healthy diet, regular exercise, adequate sleep. Consider daily probiotics. Take medications as prescribed. Labs reviewed, given Prevnar 13. Last colonoscopy 2015 repeat in 2025      Vitamin D deficiency    Supplement and monitor      Urticaria    Adequately controlled on antihistmines,       Anxiety - Primary   Relevant Orders   Drug Monitoring Panel [phone] , Urine   Other Visit Diagnoses     High risk medication use       Relevant Orders   Drug Monitoring Panel [phone] , Urine   Need for Streptococcus pneumoniae vaccination       Relevant Orders   Pneumococcal conjugate vaccine 13-valent (Completed)       Follow-up: Return in about 6 months (around 04/19/2022) for f/u visit.  I, Billie Lade, acting as a scribe for Danise Edge, MD, have documented all relevent documentation on behalf of Danise Edge, MD, as directed by Danise Edge, MD while in the presence of Danise Edge, MD. DO:10/20/21.  I, Bradd Canary, MD personally performed the services described in this documentation. All medical record entries made by the scribe were at my direction and in my presence. I have reviewed the chart and agree that the record reflects my personal performance and is accurate and complete

## 2021-10-19 NOTE — Assessment & Plan Note (Signed)
hgba1c acceptable, minimize simple carbs. Increase exercise as tolerated.  

## 2021-10-19 NOTE — Patient Instructions (Addendum)
Molnupiravir/Paxlovid is the new COVID medication we can give you if you get COVID so make sure you test if you have symptoms because we have to treat by day 5 of symptoms for it to be effective. If you are positive let us know so we can treat. If a home test is negative and your symptoms are persistent get a PCR test. Can check testing locations at [email or web address] If you are positive we will make an appointment with [place] and we will send in molnupiravir/paxlovid if you would like it. Check with your pharmacy before we meet to confirm they have it in stock, if they do not then we can get the prescription at the [facility].    Multivitamins with minerals   Preventive Care 65 Years and Older, Male Preventive care refers to lifestyle choices and visits with your health care provider that can promote health and wellness. This includes: A yearly physical exam. This is also called an annual wellness visit. Regular dental and eye exams. Immunizations. Screening for certain conditions. Healthy lifestyle choices, such as: Eating a healthy diet. Getting regular exercise. Not using drugs or products that contain nicotine and tobacco. Limiting alcohol use. What can I expect for my preventive care visit? Physical exam Your health care provider will check your: Height and weight. These may be used to calculate your BMI (body mass index). BMI is a measurement that tells if you are at a healthy weight. Heart rate and blood pressure. Body temperature. Skin for abnormal spots. Counseling Your health care provider may ask you questions about your: Past medical problems. Family's medical history. Alcohol, tobacco, and drug use. Emotional well-being. Home life and relationship well-being. Sexual activity. Diet, exercise, and sleep habits. History of falls. Memory and ability to understand (cognition). Work and work Astronomer. Access to firearms. What immunizations do I  need? Vaccines are usually given at 75 years ages, according to a schedule. Your health care provider will recommend vaccines for you based on your age, medical history, and lifestyle or other factors, such as travel or where you work. What tests do I need? Blood tests Lipid and cholesterol levels. These may be checked every 5 years, or more often depending on your overall health. Hepatitis C test. Hepatitis B test. Screening Lung cancer screening. You may have this screening every year starting at age 75 if you have a 30-pack-year history of smoking and currently smoke or have quit within the past 15 years. Colorectal cancer screening. All adults should have this screening starting at age 75 and continuing until age 75. Your health care provider may recommend screening at age 75 if you are at increased risk. You will have tests every 1-10 years, depending on your results and the type of screening test. Prostate cancer screening. Recommendations will vary depending on your family history and other risks. Genital exam to check for testicular cancer or hernias. Diabetes screening. This is done by checking your blood sugar (glucose) after you have not eaten for a while (fasting). You may have this done every 1-3 years. Abdominal aortic aneurysm (AAA) screening. You may need this if you are a current or former smoker. STD (sexually transmitted disease) testing, if you are at risk. Follow these instructions at home: Eating and drinking  Eat a diet that includes fresh fruits and vegetables, whole grains, lean protein, and low-fat dairy products. Limit your intake of foods with high amounts of sugar, saturated fats, and salt. Take vitamin and mineral supplements as recommended by  your health care provider. Do not drink alcohol if your health care provider tells you not to drink. If you drink alcohol: Limit how much you have to 0-2 drinks a day. Be aware of how much alcohol is in your drink. In  the [place] one drink equals one 12 oz bottle of beer (355 mL), one 5 oz glass of wine (148 mL), or one 1 oz glass of hard liquor (44 mL). Lifestyle Take daily care of your teeth and gums. Brush your teeth every morning and night with fluoride toothpaste. Floss one time each day. Stay active. Exercise for at least 30 minutes 5 or more days each week. Do not use any products that contain nicotine or tobacco, such as cigarettes, e-cigarettes, and chewing tobacco. If you need help quitting, ask your health care provider. Do not use drugs. If you are sexually active, practice safe sex. Use a condom or other form of protection to prevent STIs (sexually transmitted infections). Talk with your health care provider about taking a low-dose aspirin or statin. Find healthy ways to cope with stress, such as: Meditation, yoga, or listening to music. Journaling. Talking to a trusted person. Spending time with friends and family. Safety Always wear your seat belt while driving or riding in a vehicle. Do not drive: If you have been drinking alcohol. Do not ride with someone who has been drinking. When you are tired or distracted. While texting. Wear a helmet and other protective equipment during sports activities. If you have firearms in your house, make sure you follow all gun safety procedures. What's next? Visit your health care provider once a year for an annual wellness visit. Ask your health care provider how often you should have your eyes and teeth checked. Stay up to date on all vaccines. This information is not intended to replace advice given to you by your health care provider. Make sure you discuss any questions you have with your health care provider. Document Revised: 02/17/2021 Document Reviewed: 12/04/2018 Elsevier Patient Education  2022 [employer].

## 2021-10-20 ENCOUNTER — Encounter: Payer: Self-pay | Admitting: *Deleted

## 2021-10-20 NOTE — Assessment & Plan Note (Signed)
Patient encouraged to maintain heart healthy diet, regular exercise, adequate sleep. Consider daily probiotics. Take medications as prescribed. Labs reviewed, given Prevnar 13. Last colonoscopy 2015 repeat in 2025

## 2021-10-20 NOTE — Assessment & Plan Note (Signed)
Encouraged moist heat and gentle stretching as tolerated. May try NSAIDs and prescription meds as directed and report if symptoms worsen or seek immediate care. Xray ordered today. Pain is very low and described as a pressure at sacrum when he sites down. He believes it is connected to heavy lifting

## 2021-10-23 ENCOUNTER — Telehealth: Payer: Self-pay

## 2021-10-23 LAB — DRUG MONITORING PANEL 376104, URINE
Alphahydroxyalprazolam: NEGATIVE ng/mL (ref <25)
Alphahydroxymidazolam: NEGATIVE ng/mL (ref <50)
Alphahydroxytriazolam: NEGATIVE ng/mL (ref <50)
Aminoclonazepam: NEGATIVE ng/mL (ref <25)
Amphetamines: NEGATIVE ng/mL (ref <500)
Barbiturates: NEGATIVE ng/mL (ref <300)
Benzodiazepines: POSITIVE ng/mL — AB (ref <100)
Cocaine Metabolite: NEGATIVE ng/mL (ref <150)
Desmethyltramadol: NEGATIVE ng/mL (ref <100)
Hydroxyethylflurazepam: NEGATIVE ng/mL (ref <50)
Lorazepam: NEGATIVE ng/mL (ref <50)
Nordiazepam: 199 ng/mL — ABNORMAL HIGH (ref <50)
Opiates: NEGATIVE ng/mL (ref <100)
Oxazepam: 283 ng/mL — ABNORMAL HIGH (ref <50)
Oxycodone: NEGATIVE ng/mL (ref <100)
Temazepam: 457 ng/mL — ABNORMAL HIGH (ref <50)
Tramadol: NEGATIVE ng/mL (ref <100)

## 2021-10-23 LAB — DM TEMPLATE

## 2021-10-23 LAB — INSULIN, RANDOM: Insulin: 4.8 u[IU]/mL

## 2021-10-23 NOTE — Telephone Encounter (Signed)
Patient was diagnosed with osteopenia and has a PSA level of 9.5.  Former pt of Dr. Pete Glatter.  Waiting for [facility] to be credentialed with [employer].  Wanting to know who else [facility] may recommend they could go to if he doesn't get credentialed before Nov.18th?  Please advise wife

## 2021-10-23 NOTE — Telephone Encounter (Signed)
Patient called and offered appointment with another MD. Patient declined - patient wishes to wait for Dr. Pete Glatter.

## 2021-10-23 NOTE — Telephone Encounter (Incomplete Revision)
Patient was diagnosed with osteopenia and has a PSA level of 9.5.  Former pt of Dr. Pete Glatter.  Waiting for [facility] to be credentialed with [employer].  Wanting to know who else [facility] may recommend they could go to if he doesn't get credentialed before Nov.18th?  Please advise wife Maralyn Sago [phone] Asap.  Thanks, Rosey Bath

## 2021-11-10 ENCOUNTER — Ambulatory Visit: Payer: Medicare Other | Admitting: Urology

## 2021-11-24 ENCOUNTER — Other Ambulatory Visit: Payer: Self-pay

## 2021-11-24 ENCOUNTER — Ambulatory Visit (INDEPENDENT_AMBULATORY_CARE_PROVIDER_SITE_OTHER): Payer: Medicare Other | Admitting: Urology

## 2021-11-24 ENCOUNTER — Encounter: Payer: Self-pay | Admitting: Urology

## 2021-11-24 VITALS — BP 138/79 | HR 71 | Wt 186.0 lb

## 2021-11-24 DIAGNOSIS — N138 Other obstructive and reflux uropathy: Secondary | ICD-10-CM | POA: Diagnosis not present

## 2021-11-24 DIAGNOSIS — N529 Male erectile dysfunction, unspecified: Secondary | ICD-10-CM | POA: Diagnosis not present

## 2021-11-24 DIAGNOSIS — R972 Elevated prostate specific antigen [PSA]: Secondary | ICD-10-CM

## 2021-11-24 DIAGNOSIS — N401 Enlarged prostate with lower urinary tract symptoms: Secondary | ICD-10-CM

## 2021-11-24 LAB — URINALYSIS, ROUTINE W REFLEX MICROSCOPIC
Bilirubin, UA: NEGATIVE
Glucose, UA: NEGATIVE
Ketones, UA: NEGATIVE
Leukocytes,UA: NEGATIVE
Nitrite, UA: NEGATIVE
Protein,UA: NEGATIVE
RBC, UA: NEGATIVE
Specific Gravity, UA: 1.015 (ref 1.005–1.030)
Urobilinogen, Ur: 0.2 mg/dL (ref 0.2–1.0)
pH, UA: 5.5 (ref 5.0–7.5)

## 2021-11-24 NOTE — Progress Notes (Signed)
Assessment: 1. Elevated PSA; multiple negative biopsies   2. BPH with obstruction/lower urinary tract symptoms   3. Organic impotence     Plan: I reviewed his records from [facility] as well as the [place]. Free and total PSA today.  We will contact him with results. Continue tamsulosin 0.8 mg daily. Discussed addition of a 5 alpha reductase inhibitor for management of his BPH with a secondary benefit of chemoprevention of prostate cancer.  Chief Complaint: Chief Complaint  Patient presents with   Elevated PSA    HPI: Christopher Haynes is a 75 y.o. male who presents for continued evaluation of elevated PSA, BPH with obstruction, and erectile dysfunction. He has a history of an elevated PSA. He underwent transrectal ultrasound and biopsy of the prostate in 2004, 2007, and last in June 2011. PSA was 8.59 at that time. Prostate volume measured 43 gm. Biopsies showed benign prostate tissue with focal chronic inflammation and no malignancy. Serial PSA results: 5.92 21% free 2/08 5.50 13% free 4/08 6.21 17% free 7/08 5.60 2/09 5.34 13% free 7/09 7.48 2/10 7.02 10% free 5/10 5.14 11% free 9/10 6.28 12% free 1/11 8.59 11% free 6/11 6.69 10% free 6/12 12.9 9% free 6/13 7.31 15% free 12/13 8.68 13% free 1/15 7.33 6/15 7.10 12/15   4K score from 06/24/15: 7.3 with 9% probability of aggressive prostate cancer on biopsy 4K score from 1/17: 7.53 with 14% free and 5% probability of aggressive prostate cancer on biopsy  PSA from 8/17: 8.0 With 11.9% free 4K test from 5/18: 8.21 with 12% free, 9% probability of aggressive prostate cancer on biopsy. MRI of prostate in 5/18 showed a volume of 68 ml, no suspicious lesions to suggest high grade prostate cancer. PSA from 11/18: 8.47 with 11% free 4K test from 07/03/18 showed: PSA 5.86, free PSA 13%, 6% probability of aggressive disease on biopsy. PSA from 1/20: 7.07 with 18% free. PSA from 8/20: 6.53 with 12% free PSA from 2/21: 7.82 with 13.6%  free. 4K test from 09/09/20: PSA 7.09, 12% free, 46% probability of aggressive prostate cancer on biopsy. Prostate MRI from 10/22/2020 showed no findings suspicious for high-grade prostate cancer, prostate volume 85 mL.  Prostate biopsy from 1/22 showed a volume of 83.67 mL.  No malignancy was seen on biopsy specimens. PSA from 9/22: 9.53  He has a history of urinary retention following ankle surgery. He was on Flomax following this procedure. He eventually passed a voiding trial. He has been voiding without difficulty since that time. He was previously on tamsulosin but did not notice a significant improvement in his LUTS. He has history of some frequency and urgency, occasional weak stream and incomplete emptying. Symptoms are intermittent, better and worse at different times. He uses the tamsulosin intermittently. He has not been interested in further medical therapy or surgical therapy.   He has a history of erectile dysfunction. He has used Viagra and Cialis in the past.  He returns today for follow-up.  He continues on tamsulosin 0.4 mg twice daily.  His urinary symptoms are stable.  He does have some urgency.  No dysuria or gross hematuria. IPSS = 7 today.  Portions of the above documentation were copied from a prior visit for review purposes only.  Allergies: Allergies  Allergen Reactions   Sumatriptan Nausea And Vomiting   Tolmetin     Other reaction(s): Other (See Comments)    PMH: Past Medical History:  Diagnosis Date   Anxiety 08/05/2017  Back pain 08/20/2016   Benign prostatic hyperplasia (BPH) with urinary urgency 02/09/2016   Constipation 02/20/2016   Contusion 02/20/2016   Elevated PSA    H/O measles    Headache    History of colonic polyps 02/09/2016   [facility]. Now on 5 year plan   Hyperglycemia 08/20/2016   Hyperlipidemia, mixed 02/08/2017   Macular degeneration of left eye 02/19/2016   Migraine with visual aura  02/09/2016   Neck pain 08/05/2017   OSA on CPAP    Pain in joint, shoulder region 02/19/2016   Peripheral neuropathy 08/05/2017   Thyroid disease    thyroiditis, h/o in 57s    PSH: Past Surgical History:  Procedure Laterality Date   ANKLE FRACTURE SURGERY  66   KNEE ARTHROSCOPY WITH SUBCHONDROPLASTY Left 12/13/2017   Procedure: KNEE ARTHROSCOPY WITH SUBCHONDROPLASTY;  Surgeon: Melrose Nakayama, MD;  Location: [place];  Service: Orthopedics;  Laterality: Left;   SKIN GRAFT  Age 46   TONSILLECTOMY  Age 44    SH: Social History   Tobacco Use   Smoking status: Never   Smokeless tobacco: Never  Substance Use Topics   Alcohol use: Yes    Alcohol/week: 0.0 standard drinks   Drug use: No    ROS: Constitutional:  Negative for fever, chills, weight loss CV: Negative for chest pain, previous MI, hypertension Respiratory:  Negative for shortness of breath, wheezing, sleep apnea, frequent cough GI:  Negative for nausea, vomiting, bloody stool, GERD  PE: BP 138/79   Pulse 71   Wt 186 lb (84.4 kg)   BMI 30.95 kg/m  GENERAL APPEARANCE:  Well appearing, well developed, well nourished, NAD HEENT:  Atraumatic, normocephalic, oropharynx clear NECK:  Supple without lymphadenopathy or thyromegaly ABDOMEN:  Soft, non-tender, no masses EXTREMITIES:  Moves all extremities well, without clubbing, cyanosis, or edema NEUROLOGIC:  Alert and oriented x 3, normal gait, CN II-XII grossly intact MENTAL STATUS:  appropriate BACK:  Non-tender to palpation, No CVAT SKIN:  Warm, dry, and intact GU: Prostate: 60 g, NT, no nodules Rectum: Normal tone,  no masses or tenderness    Results: U/A:  dipstick negative

## 2021-11-24 NOTE — Patient Instructions (Signed)
Finasteride Dutasteride

## 2021-11-24 NOTE — Progress Notes (Signed)
Urological Symptom Review  Patient is experiencing the following symptoms: Hard to postpone urination   Review of Systems  Gastrointestinal (upper)  : Negative for upper GI symptoms  Gastrointestinal (lower) : Negative for lower GI symptoms  Constitutional : Negative for symptoms  Skin: Negative for skin symptoms  Eyes: Negative for eye symptoms  Ear/Nose/Throat : Negative for Ear/Nose/Throat symptoms  Hematologic/Lymphatic: Negative for Hematologic/Lymphatic symptoms  Cardiovascular : Negative for cardiovascular symptoms  Respiratory : Negative for respiratory symptoms  Endocrine: Negative for endocrine symptoms  Musculoskeletal: Negative for musculoskeletal symptoms  Neurological: Headaches  Psychologic: Negative for psychiatric symptoms

## 2021-11-25 LAB — PSA, TOTAL AND FREE
PSA, Free Pct: 15.4 %
PSA, Free: 1.22 ng/mL
Prostate Specific Ag, Serum: 7.9 ng/mL — ABNORMAL HIGH (ref 0.0–4.0)

## 2021-11-28 ENCOUNTER — Other Ambulatory Visit: Payer: Self-pay | Admitting: Family Medicine

## 2021-11-28 MED ORDER — DIAZEPAM 10 MG PO TABS
ORAL_TABLET | Freq: Every day | ORAL | 1 refills | Status: DC | PRN
Start: 2021-11-28 — End: 2022-01-04
  Filled 2021-11-28: qty 30, 30d supply, fill #0
  Filled 2022-01-04: qty 30, 30d supply, fill #1

## 2021-11-28 NOTE — Telephone Encounter (Signed)
Requesting: diazepam 10mg   Contract: 10/19/2021 UDS: 10/19/2021 Last Visit: 10/19/2021 Next Visit: 04/26/2022 Last Refill: 07/12/2021 #30 and 0RF  Please Advise

## 2021-11-29 ENCOUNTER — Other Ambulatory Visit (HOSPITAL_BASED_OUTPATIENT_CLINIC_OR_DEPARTMENT_OTHER): Payer: Self-pay

## 2021-12-25 ENCOUNTER — Other Ambulatory Visit: Payer: Self-pay | Admitting: Internal Medicine

## 2021-12-25 MED ORDER — NIRMATRELVIR/RITONAVIR (PAXLOVID)TABLET: 3.0000 | ORAL_TABLET | Freq: Two times a day (BID) | ORAL | 0 refills | Status: AC

## 2021-12-25 MED ORDER — HYDROCODONE BIT-HOMATROP MBR 5-1.5 MG/5ML PO SOLN: 5.0000 mL | Freq: Three times a day (TID) | ORAL | 0 refills | Status: DC | PRN

## 2021-12-26 ENCOUNTER — Telehealth: Payer: Self-pay

## 2021-12-26 NOTE — Telephone Encounter (Signed)
Nurse Assessment Nurse: Violeta Gelinas, RN, Regulatory affairs officer (Eastern Time): 12/25/2021 10:57:50 AM Confirm and document reason for call. If symptomatic, describe symptoms. ---Pt tested positive for covid yesterday. symptoms: watery eyes, sore throat, cough, chest pain. asking for paxlovid. symptoms started sunday. states didn't feel right and throat was raspy saturday. Does the patient have any new or worsening symptoms? ---Yes Will a triage be completed? ---Yes Related visit to physician within the last 2 weeks? ---No Does the PT have any chronic conditions? (i.e. diabetes, asthma, this includes High risk factors for pregnancy, etc.) ---Yes List chronic conditions. ---Rudi Coco Is this a behavioral health or substance abuse call? ---No Guidelines Guideline Title Affirmed Question Affirmed Notes Nurse Date/Time (Eastern Time) COVID-19 - Diagnosed or Suspected SEVERE or constant chest pain or pressure (Exception: Mild central chest pain, present only when coughing.) Violeta Gelinas, RN, [facility] 12/25/2021 10:58:23 AM PLEASE NOTE: All timestamps contained within this report are represented as [place] Standard Time. CONFIDENTIALTY NOTICE: This fax transmission is intended only for the addressee. It contains information that is legally privileged, confidential or otherwise protected from use or disclosure. If you are not the intended recipient, you are strictly prohibited from reviewing, disclosing, copying using or disseminating any of this information or taking any action in reliance on or regarding this information. If you have received this fax in error, please notify us immediately by telephone so that we can arrange for its return to [place]. Phone: [phone], Toll-Free: [phone], Fax: [phone] Page: 2 of 2 Call Id: [id number] Disp. Time Lamount Cohen Time) Disposition Final User 12/25/2021 10:56:46 AM Send to Urgent Queue Evern Core 12/25/2021 11:02:48 AM Go to ED Now Yes Violeta Gelinas, RN,  [facility] Caller Disagree/Comply Comply Caller Understands Yes PreDisposition Did not know what to do Care Advice Given Per Guideline GO TO ED NOW: * You need to be seen in the Emergency Department. CARE ADVICE given per COVID-19 - DIAGNOSED OR SUSPECTED (Adult) guideline. Referrals [facility] - ED

## 2021-12-26 NOTE — Telephone Encounter (Signed)
Patient went to urgent care and he did get medication.

## 2022-01-04 ENCOUNTER — Other Ambulatory Visit: Payer: Self-pay | Admitting: Family Medicine

## 2022-01-04 ENCOUNTER — Telehealth: Payer: Self-pay

## 2022-01-04 ENCOUNTER — Other Ambulatory Visit: Payer: Self-pay

## 2022-01-04 ENCOUNTER — Other Ambulatory Visit (HOSPITAL_BASED_OUTPATIENT_CLINIC_OR_DEPARTMENT_OTHER): Payer: Self-pay

## 2022-01-04 MED ORDER — DIAZEPAM 10 MG PO TABS
ORAL_TABLET | Freq: Every day | ORAL | 1 refills | Status: DC | PRN
  Filled 2022-01-04 – 2022-01-05 (×2): qty 30, 30d supply, fill #0
  Filled 2022-02-22: qty 30, 30d supply, fill #1

## 2022-01-04 NOTE — Telephone Encounter (Signed)
Pt stopped by the office. He needs the diazepam refilled to the [facility] pharmacy

## 2022-01-05 ENCOUNTER — Telehealth: Payer: Self-pay

## 2022-01-05 ENCOUNTER — Other Ambulatory Visit (HOSPITAL_BASED_OUTPATIENT_CLINIC_OR_DEPARTMENT_OTHER): Payer: Self-pay

## 2022-01-05 NOTE — Telephone Encounter (Signed)
PA initiated via Covermymeds; KEY: [id number]. Awaiting determination.

## 2022-01-05 NOTE — Telephone Encounter (Signed)
PA approved. Effective from 01/05/2022 through 01/05/2023. °

## 2022-01-17 ENCOUNTER — Telehealth: Payer: Self-pay | Admitting: Family Medicine

## 2022-01-17 NOTE — Telephone Encounter (Signed)
Patient wife came in for labs and dropped off imaging of patient.  They are wanting Christopher Haynes to see the results and wanting to know what the next steps would be. Or what she recommends

## 2022-01-19 NOTE — Telephone Encounter (Signed)
Dr. Abner Greenspan has paperwork

## 2022-01-29 ENCOUNTER — Encounter: Payer: Self-pay | Admitting: Urology

## 2022-01-30 NOTE — Telephone Encounter (Signed)
Pt aware of Dr. Abner Greenspan instructions that it would be cheap[facility] through [facility] and [facility] should follow [facility]

## 2022-02-22 ENCOUNTER — Other Ambulatory Visit (HOSPITAL_BASED_OUTPATIENT_CLINIC_OR_DEPARTMENT_OTHER): Payer: Self-pay

## 2022-02-23 ENCOUNTER — Encounter: Payer: Self-pay | Admitting: Urology

## 2022-02-23 ENCOUNTER — Ambulatory Visit (INDEPENDENT_AMBULATORY_CARE_PROVIDER_SITE_OTHER): Payer: Medicare Other | Admitting: Urology

## 2022-02-23 ENCOUNTER — Other Ambulatory Visit: Payer: Self-pay

## 2022-02-23 VITALS — BP 139/84 | HR 67 | Wt 180.0 lb

## 2022-02-23 DIAGNOSIS — R972 Elevated prostate specific antigen [PSA]: Secondary | ICD-10-CM | POA: Diagnosis not present

## 2022-02-23 DIAGNOSIS — N401 Enlarged prostate with lower urinary tract symptoms: Secondary | ICD-10-CM

## 2022-02-23 DIAGNOSIS — N138 Other obstructive and reflux uropathy: Secondary | ICD-10-CM

## 2022-02-23 LAB — URINALYSIS, ROUTINE W REFLEX MICROSCOPIC
Bilirubin, UA: NEGATIVE
Glucose, UA: NEGATIVE
Ketones, UA: NEGATIVE
Leukocytes,UA: NEGATIVE
Nitrite, UA: NEGATIVE
Protein,UA: NEGATIVE
RBC, UA: NEGATIVE
Specific Gravity, UA: 1.025 (ref 1.005–1.030)
Urobilinogen, Ur: 0.2 mg/dL (ref 0.2–1.0)
pH, UA: 6.5 (ref 5.0–7.5)

## 2022-02-23 NOTE — Progress Notes (Signed)
[facility] urine collected tracking #[id number] ?Pick up # [id number] ?

## 2022-02-23 NOTE — Addendum Note (Signed)
Addended byIris Pert on: 02/23/2022 02:09 PM ? ? Modules accepted: Orders ? ?

## 2022-02-23 NOTE — Progress Notes (Signed)
? ?Assessment: ?1. Elevated PSA; multiple negative biopsies   ?2. BPH with obstruction/lower urinary tract symptoms   ? ? ?Plan: ?Continue tamsulosin 0.8 mg daily. ?ExoDx sent today ?Will contact him with results ? ?Chief Complaint: ?Chief Complaint  ?Patient presents with  ? Elevated PSA  ? ? ?HPI: ?Christopher Haynes is a 76 y.o. male who presents for continued evaluation of elevated PSA, BPH with obstruction, and erectile dysfunction. ?He has a history of an elevated PSA. He underwent transrectal ultrasound and biopsy of the prostate in 2004, 2007, and last in June 2011. PSA was 8.59 at that time. Prostate volume measured 43 gm. Biopsies showed benign prostate tissue with focal chronic inflammation and no malignancy. ?Serial PSA results: ?5.92 21% free 2/08 ?5.50 13% free 4/08 ?6.21 17% free 7/08 ?5.60 2/09 ?5.34 13% free 7/09 ?7.48 2/10 ?7.02 10% free 5/10 ?5.14 11% free 9/10 ?6.28 12% free 1/11 ?8.59 11% free 6/11 ?6.69 10% free 6/12 ?12.9 9% free 6/13 ?7.31 15% free 12/13 ?8.68 13% free 1/15 ?7.33 6/15 ?7.10 12/15 ?  ?4K score from 06/24/15: 7.3 with 9% probability of aggressive prostate cancer on biopsy ?4K score from 1/17: 7.53 with 14% free and 5% probability of aggressive prostate cancer on biopsy ? PSA from 8/17: 8.0 With 11.9% free ?4K test from 5/18: 8.21 with 12% free, 9% probability of aggressive prostate cancer on biopsy. ?MRI of prostate in 5/18 showed a volume of 68 ml, no suspicious lesions to suggest high grade prostate cancer. ?PSA from 11/18: 8.47 with 11% free ?4K test from 07/03/18 showed: PSA 5.86, free PSA 13%, 6% probability of aggressive disease on biopsy. ?PSA from 1/20: 7.07 with 18% free. ?PSA from 8/20: 6.53 with 12% free ?PSA from 2/21: 7.82 with 13.6% free. ?4K test from 09/09/20: PSA 7.09, 12% free, 46% probability of aggressive prostate cancer on biopsy. ?Prostate MRI from 10/22/2020 showed no findings suspicious for high-grade prostate cancer, prostate volume 85 mL.  ?Prostate biopsy  from 1/22 showed a volume of 83.67 mL.  No malignancy was seen on biopsy specimens. ?PSA from 9/22: 9.53 ?PSA from 12/22:  7.9 with 15.4% free ?PSA from 2/23:  9.28  (done at [place]) ? ?He has a history of urinary retention following ankle surgery. He was on Flomax following this procedure. He eventually passed a voiding trial. He has been voiding without difficulty since that time. He was previously on tamsulosin but did not notice a significant improvement in his LUTS. He has history of some frequency and urgency, occasional weak stream and incomplete emptying. Symptoms are intermittent, better and worse at different times. He uses the tamsulosin intermittently. He has not been interested in further medical therapy or surgical therapy.  ? ?He has a history of erectile dysfunction. He has used Viagra and Cialis in the past. ? ?At his visit in 12/22, he continued on tamsulosin 0.4 mg twice daily.  His urinary symptoms are stable.  He reported some urgency.  No dysuria or gross hematuria. ?IPSS = 7. ? ?He presents today for ExoDx test. ?No change in his urinary symptoms.  He continues on tamsulosin. No dysuria or gross hematuria.   ?IPSS = 10 today. ? ?Portions of the above documentation were copied from a prior visit for review purposes only. ? ?Allergies: ?Allergies  ?Allergen Reactions  ? Sumatriptan Nausea And Vomiting  ? Tolmetin   ?  Other reaction(s): Other (See Comments)  ? ? ?PMH: ?Past Medical History:  ?Diagnosis Date  ? Anxiety 08/05/2017  ?  Back pain 08/20/2016  ? Benign prostatic hyperplasia (BPH) with urinary urgency 02/09/2016  ? Constipation 02/20/2016  ? Contusion 02/20/2016  ? Elevated PSA   ? H/O measles   ? Headache   ? History of colonic polyps 02/09/2016  ? [facility]. Now on 5 year plan  ? Hyperglycemia 08/20/2016  ? Hyperlipidemia, mixed 02/08/2017  ? Macular degeneration of left eye 02/19/2016  ? Migraine with visual aura 02/09/2016  ? Neck pain 08/05/2017  ?  OSA on CPAP   ? Pain in joint, shoulder region 02/19/2016  ? Peripheral neuropathy 08/05/2017  ? Thyroid disease   ? thyroiditis, h/o in 15s  ? ? ?PSH: ?Past Surgical History:  ?Procedure Laterality Date  ? ANKLE FRACTURE SURGERY  66  ? KNEE ARTHROSCOPY WITH SUBCHONDROPLASTY Left 12/13/2017  ? Procedure: KNEE ARTHROSCOPY WITH SUBCHONDROPLASTY;  Surgeon: Melrose Nakayama, MD;  Location: [place];  Service: Orthopedics;  Laterality: Left;  ? SKIN GRAFT  Age 40  ? TONSILLECTOMY  Age 19  ? ? ?SH: ?Social History  ? ?Tobacco Use  ? Smoking status: Never  ? Smokeless tobacco: Never  ?Substance Use Topics  ? Alcohol use: Yes  ?  Alcohol/week: 0.0 standard drinks  ? Drug use: No  ? ? ?ROS: ?Constitutional:  Negative for fever, chills, weight loss ?CV: Negative for chest pain, previous MI, hypertension ?Respiratory:  Negative for shortness of breath, wheezing, sleep apnea, frequent cough ?GI:  Negative for nausea, vomiting, bloody stool, GERD ? ?PE: ?BP 139/84   Pulse 67   Wt 180 lb (81.6 kg)   BMI 29.95 kg/m?  ?GENERAL APPEARANCE:  Well appearing, well developed, well nourished, NAD ?HEENT:  Atraumatic, normocephalic, oropharynx clear ?NECK:  Supple without lymphadenopathy or thyromegaly ?ABDOMEN:  Soft, non-tender, no masses ?EXTREMITIES:  Moves all extremities well, without clubbing, cyanosis, or edema ?NEUROLOGIC:  Alert and oriented x 3, normal gait, CN II-XII grossly intact ?MENTAL STATUS:  appropriate ?BACK:  Non-tender to palpation, No CVAT ?SKIN:  Warm, dry, and intact ?  ? ?Results: ?U/A dipstick negative ? ?

## 2022-02-28 ENCOUNTER — Other Ambulatory Visit: Payer: Self-pay | Admitting: Urology

## 2022-03-01 ENCOUNTER — Telehealth: Payer: Self-pay

## 2022-03-01 NOTE — Telephone Encounter (Signed)
Patient calling for recent test results.

## 2022-03-02 ENCOUNTER — Other Ambulatory Visit: Payer: Self-pay | Admitting: Urology

## 2022-03-02 DIAGNOSIS — R972 Elevated prostate specific antigen [PSA]: Secondary | ICD-10-CM

## 2022-03-02 NOTE — Telephone Encounter (Signed)
ExoDx test shows an increased risk of high grade prostate cancer. ?His score was 57.85. ?I discussed these results with the patient and his wife by phone. ?I have recommended further evaluation with a prostate MRI.  We also discussed the potential need for further evaluation with a biopsy. ?Numerous questions were answered. ?MRI order placed. ?

## 2022-03-05 ENCOUNTER — Other Ambulatory Visit: Payer: Self-pay | Admitting: Urology

## 2022-03-05 ENCOUNTER — Encounter: Payer: Self-pay | Admitting: Urology

## 2022-03-05 DIAGNOSIS — R972 Elevated prostate specific antigen [PSA]: Secondary | ICD-10-CM

## 2022-03-05 NOTE — Telephone Encounter (Signed)
Please see patient message.

## 2022-03-08 ENCOUNTER — Encounter: Payer: Self-pay | Admitting: Urology

## 2022-03-09 NOTE — Telephone Encounter (Signed)
I called and got the information they needed. I will fax over these records for the referral since I cant use proficient yet.  ?

## 2022-03-09 NOTE — Telephone Encounter (Signed)
Thank you :)

## 2022-03-09 NOTE — Telephone Encounter (Signed)
Can you check on patient referral please ?

## 2022-04-16 ENCOUNTER — Other Ambulatory Visit (HOSPITAL_BASED_OUTPATIENT_CLINIC_OR_DEPARTMENT_OTHER): Payer: Self-pay

## 2022-04-16 ENCOUNTER — Other Ambulatory Visit: Payer: Self-pay | Admitting: Family Medicine

## 2022-04-16 MED ORDER — DIAZEPAM 10 MG PO TABS
ORAL_TABLET | Freq: Every day | ORAL | 1 refills | Status: DC | PRN
  Filled 2022-04-16: qty 30, 30d supply, fill #0
  Filled 2022-06-12: qty 30, 30d supply, fill #1

## 2022-04-16 NOTE — Telephone Encounter (Signed)
Requesting: Valium 10mg  ?Contract: 10/19/21 ?UDS: 10/19/21 ?Last Visit: 10/19/21 ?Next Visit: 04/26/22 ?Last Refill: 01/04/22 ? ?Please Advise  ?

## 2022-04-19 DIAGNOSIS — R972 Elevated prostate specific antigen [PSA]: Secondary | ICD-10-CM | POA: Diagnosis not present

## 2022-04-19 DIAGNOSIS — N400 Enlarged prostate without lower urinary tract symptoms: Secondary | ICD-10-CM | POA: Diagnosis not present

## 2022-04-26 ENCOUNTER — Ambulatory Visit: Payer: Medicare Other | Admitting: Family Medicine

## 2022-04-26 ENCOUNTER — Telehealth: Payer: Self-pay | Admitting: Family Medicine

## 2022-04-26 NOTE — Telephone Encounter (Signed)
Left message for patient to call back and schedule Medicare Annual Wellness Visit (AWV).  ? ?Please offer to do virtually or by telephone.  Left office number and my jabber [phone]. ? ?Last AWV:02/13/2018 ? ?Please schedule at anytime with Nurse Health Advisor. ?  ?

## 2022-05-25 ENCOUNTER — Telehealth: Payer: Self-pay | Admitting: Family Medicine

## 2022-05-25 ENCOUNTER — Ambulatory Visit: Payer: Medicare Other | Admitting: Urology

## 2022-05-25 NOTE — Telephone Encounter (Signed)
Left message for patient to call back and schedule Medicare Annual Wellness Visit (AWV).   Please offer to do virtually or by telephone.  Left office number and my jabber [phone].  Last AWV:02/13/2018  Please schedule at anytime with Nurse Health Advisor.

## 2022-06-08 ENCOUNTER — Ambulatory Visit: Payer: Medicare Other | Admitting: Podiatry

## 2022-06-08 DIAGNOSIS — L603 Nail dystrophy: Secondary | ICD-10-CM

## 2022-06-12 ENCOUNTER — Other Ambulatory Visit (HOSPITAL_BASED_OUTPATIENT_CLINIC_OR_DEPARTMENT_OTHER): Payer: Self-pay

## 2022-06-12 NOTE — Progress Notes (Signed)
Subjective:  Patient ID: Christopher Haynes, male    DOB: Jan 24, 1946,  MRN: [id number]  Chief Complaint  Patient presents with   Nail Problem    76 y.o. male presents with the above complaint.  Patient presents with complaint left hallux nail fungus.  Patient states is thickened elongated dystrophic nail.  Hurts with ambulation.  He states it does not hurt as much.  He would like to discuss treatment options for nail fungus he has not seen anyone else prior to seeing me.  He denies any other acute complaints.  Hurts with ambulation.   Review of Systems: Negative except as noted in the HPI. Denies N/V/F/Ch.  Past Medical History:  Diagnosis Date   Anxiety 08/05/2017   Back pain 08/20/2016   Benign prostatic hyperplasia (BPH) with urinary urgency 02/09/2016   Constipation 02/20/2016   Contusion 02/20/2016   Elevated PSA    H/O measles    Headache    History of colonic polyps 02/09/2016   [facility]. Now on 5 year plan   Hyperglycemia 08/20/2016   Hyperlipidemia, mixed 02/08/2017   Macular degeneration of left eye 02/19/2016   Migraine with visual aura 02/09/2016   Neck pain 08/05/2017   OSA on CPAP    Pain in joint, shoulder region 02/19/2016   Peripheral neuropathy 08/05/2017   Thyroid disease    thyroiditis, h/o in 20s    Current Outpatient Medications:    cetirizine (ZYRTEC) 10 MG tablet, Take 10 mg by mouth daily., Disp: , Rfl:    cyclobenzaprine (FLEXERIL) 10 MG tablet, TAKE ONE TABLET BY MOUTH AT BEDTIME AS NEEDED FOR MUSCLE SPASMS, Disp: , Rfl:    diazepam (VALIUM) 10 MG tablet, TAKE 1 TABLET BY MOUTH ONCE DAILY AS NEEDED, Disp: 30 tablet, Rfl: 1   diclofenac sodium (VOLTAREN) 1 % GEL, Apply 2 g topically 4 (four) times daily., Disp: , Rfl:    famotidine (PEPCID) 20 MG tablet, Take 1 tablet (20 mg total) by mouth 2 (two) times daily., Disp: 60 tablet, Rfl:    HYDROcodone bit-homatropine (HYCODAN) 5-1.5 MG/5ML syrup, Take 5 mLs by mouth every  8 (eight) hours as needed for cough., Disp: 120 mL, Rfl: 0   omega-3 fish oil (MAXEPA) 1000 MG CAPS capsule, Take 2 capsules by mouth daily., Disp: , Rfl:    tamsulosin (FLOMAX) 0.4 MG CAPS capsule, Take 0.8 mg by mouth daily. , Disp: , Rfl:    traMADol (ULTRAM) 50 MG tablet, Take 1 tablet (50 mg total) by mouth daily as needed., Disp: 30 tablet, Rfl: 0  Social History   Tobacco Use  Smoking Status Never  Smokeless Tobacco Never    Allergies  Allergen Reactions   Sumatriptan Nausea And Vomiting   Tolmetin     Other reaction(s): Other (See Comments)   Objective:  There were no vitals filed for this visit. There is no height or weight on file to calculate BMI. Constitutional Well developed. Well nourished.  Vascular Dorsalis pedis pulses palpable bilaterally. Posterior tibial pulses palpable bilaterally. Capillary refill normal to all digits.  No cyanosis or clubbing noted. Pedal hair growth normal.  Neurologic Normal speech. Oriented to person, place, and time. Epicritic sensation to light touch grossly present bilaterally.  Dermatologic Nails thickened elongated dystrophic mycotic nail left hallux.  Mild pain on palpation. Skin within normal limits  Orthopedic: Normal joint ROM without pain or crepitus bilaterally. No visible deformities. No bony tenderness.   Radiographs: None Assessment:  1. Nail dystrophy    Plan:  Patient was evaluated and treated and all questions answered.  Left hallux onychomycosis -Educated the patient on the etiology of onychomycosis and various treatment options associated with improving the fungal load.  I explained to the patient that there is 3 treatment options available to treat the onychomycosis including topical, p.o., laser treatment.  Patient would like to think about all the options and will get back to me when he is ready.  Also discussed nail avulsion as well   No follow-ups on file.

## 2022-06-13 DIAGNOSIS — R972 Elevated prostate specific antigen [PSA]: Secondary | ICD-10-CM | POA: Diagnosis not present

## 2022-06-13 DIAGNOSIS — N400 Enlarged prostate without lower urinary tract symptoms: Secondary | ICD-10-CM | POA: Diagnosis not present

## 2022-06-22 DIAGNOSIS — G43B0 Ophthalmoplegic migraine, not intractable: Secondary | ICD-10-CM | POA: Diagnosis not present

## 2022-06-22 DIAGNOSIS — H04123 Dry eye syndrome of bilateral lacrimal glands: Secondary | ICD-10-CM | POA: Diagnosis not present

## 2022-06-22 DIAGNOSIS — H353131 Nonexudative age-related macular degeneration, bilateral, early dry stage: Secondary | ICD-10-CM | POA: Diagnosis not present

## 2022-06-22 DIAGNOSIS — H25813 Combined forms of age-related cataract, bilateral: Secondary | ICD-10-CM | POA: Diagnosis not present

## 2022-06-30 LAB — HM DIABETES EYE EXAM

## 2022-07-03 DIAGNOSIS — N138 Other obstructive and reflux uropathy: Secondary | ICD-10-CM | POA: Diagnosis not present

## 2022-07-03 DIAGNOSIS — N401 Enlarged prostate with lower urinary tract symptoms: Secondary | ICD-10-CM | POA: Diagnosis not present

## 2022-07-03 DIAGNOSIS — N400 Enlarged prostate without lower urinary tract symptoms: Secondary | ICD-10-CM | POA: Diagnosis not present

## 2022-07-03 DIAGNOSIS — R972 Elevated prostate specific antigen [PSA]: Secondary | ICD-10-CM | POA: Diagnosis not present

## 2022-07-05 ENCOUNTER — Other Ambulatory Visit (HOSPITAL_BASED_OUTPATIENT_CLINIC_OR_DEPARTMENT_OTHER): Payer: Self-pay

## 2022-07-05 MED ORDER — SILDENAFIL CITRATE 100 MG PO TABS
100.0000 mg | ORAL_TABLET | ORAL | 11 refills | Status: AC
  Filled 2022-07-05: qty 10, 10d supply, fill #0

## 2022-07-05 MED ORDER — FINASTERIDE 5 MG PO TABS
5.0000 mg | ORAL_TABLET | Freq: Every day | ORAL | 3 refills | Status: AC
  Filled 2022-07-05: qty 90, 90d supply, fill #0
  Filled 2022-07-10: qty 90, 90d supply, fill #1

## 2022-07-10 ENCOUNTER — Other Ambulatory Visit (HOSPITAL_BASED_OUTPATIENT_CLINIC_OR_DEPARTMENT_OTHER): Payer: Self-pay

## 2022-07-31 ENCOUNTER — Ambulatory Visit (INDEPENDENT_AMBULATORY_CARE_PROVIDER_SITE_OTHER): Payer: Medicare Other

## 2022-07-31 DIAGNOSIS — Z0000 Encounter for general adult medical examination without abnormal findings: Secondary | ICD-10-CM

## 2022-07-31 NOTE — Patient Instructions (Signed)
Christopher Haynes , Thank you for taking time to come for your Medicare Wellness Visit. I appreciate your ongoing commitment to your health goals. Please review the following plan we discussed and let me know if I can assist you in the future.   Screening recommendations/referrals: Colonoscopy: pt stated completed at [facility]  Recommended yearly ophthalmology/optometry visit for glaucoma screening and checkup Recommended yearly dental visit for hygiene and checkup  Vaccinations: Influenza vaccine: done 09/15/21 repeat every year  Pneumococcal vaccine: Up to date Tdap vaccine: due and discussed  Shingles vaccine: Completed 4/16, 06/28/18   Covid-19: completed 1/22, 01/1209/31/21 & 5/13, 09/15/21  Advanced directives: Please bring a copy of your health care power of attorney and living will to the office at your convenience.  Conditions/risks identified: lose 10 lbs   Next appointment: Follow up in one year for your annual wellness visit.   Preventive Care 76 Years and Older, Male Preventive care refers to lifestyle choices and visits with your health care provider that can promote health and wellness. What does preventive care include? A yearly physical exam. This is also called an annual well check. Dental exams once or twice a year. Routine eye exams. Ask your health care provider how often you should have your eyes checked. Personal lifestyle choices, including: Daily care of your teeth and gums. Regular physical activity. Eating a healthy diet. Avoiding tobacco and drug use. Limiting alcohol use. Practicing safe sex. Taking low doses of aspirin every day. Taking vitamin and mineral supplements as recommended by your health care provider. What happens during an annual well check? The services and screenings done by your health care provider during your annual well check will depend on your age, overall health, lifestyle risk factors, and family history of disease. Counseling  Your health care  provider may ask you questions about your: Alcohol use. Tobacco use. Drug use. Emotional well-being. Home and relationship well-being. Sexual activity. Eating habits. History of falls. Memory and ability to understand (cognition). Work and work Astronomer. Screening  You may have the following tests or measurements: Height, weight, and BMI. Blood pressure. Lipid and cholesterol levels. These may be checked every 5 years, or more frequently if you are over 59 years old. Skin check. Lung cancer screening. You may have this screening every year starting at age 76 if you have a 30-pack-year history of smoking and currently smoke or have quit within the past 15 years. Fecal occult blood test (FOBT) of the stool. You may have this test every year starting at age 76. Flexible sigmoidoscopy or colonoscopy. You may have a sigmoidoscopy every 5 years or a colonoscopy every 10 years starting at age 76. Prostate cancer screening. Recommendations will vary depending on your family history and other risks. Hepatitis C blood test. Hepatitis B blood test. Sexually transmitted disease (STD) testing. Diabetes screening. This is done by checking your blood sugar (glucose) after you have not eaten for a while (fasting). You may have this done every 1-3 years. Abdominal aortic aneurysm (AAA) screening. You may need this if you are a current or former smoker. Osteoporosis. You may be screened starting at age 76 if you are at high risk. Talk with your health care provider about your test results, treatment options, and if necessary, the need for more tests. Vaccines  Your health care provider may recommend certain vaccines, such as: Influenza vaccine. This is recommended every year. Tetanus, diphtheria, and acellular pertussis (Tdap, Td) vaccine. You may need a Td booster every 10 years.  Zoster vaccine. You may need this after age 76. Pneumococcal 13-valent conjugate (PCV13) vaccine. One dose is  recommended after age 76. Pneumococcal polysaccharide (PPSV23) vaccine. One dose is recommended after age 76. Talk to your health care provider about which screenings and vaccines you need and how often you need them. This information is not intended to replace advice given to you by your health care provider. Make sure you discuss any questions you have with your health care provider. Document Released: 01/06/2016 Document Revised: 08/29/2016 Document Reviewed: 10/11/2015 Elsevier Interactive Patient Education  2017 [place] Prevention in the Home Falls can cause injuries. They can happen to people of all ages. There are many things you can do to make your home safe and to help prevent falls. What can I do on the outside of my home? Regularly fix the edges of walkways and driveways and fix any cracks. Remove anything that might make you trip as you walk through a door, such as a raised step or threshold. Trim any bushes or trees on the path to your home. Use bright outdoor lighting. Clear any walking paths of anything that might make someone trip, such as rocks or tools. Regularly check to see if handrails are loose or broken. Make sure that both sides of any steps have handrails. Any raised decks and porches should have guardrails on the edges. Have any leaves, snow, or ice cleared regularly. Use sand or salt on walking paths during winter. Clean up any spills in your garage right away. This includes oil or grease spills. What can I do in the bathroom? Use night lights. Install grab bars by the toilet and in the tub and shower. Do not use towel bars as grab bars. Use non-skid mats or decals in the tub or shower. If you need to sit down in the shower, use a plastic, non-slip stool. Keep the floor dry. Clean up any water that spills on the floor as soon as it happens. Remove soap buildup in the tub or shower regularly. Attach bath mats securely with double-sided non-slip rug  tape. Do not have throw rugs and other things on the floor that can make you trip. What can I do in the bedroom? Use night lights. Make sure that you have a light by your bed that is easy to reach. Do not use any sheets or blankets that are too big for your bed. They should not hang down onto the floor. Have a firm chair that has side arms. You can use this for support while you get dressed. Do not have throw rugs and other things on the floor that can make you trip. What can I do in the kitchen? Clean up any spills right away. Avoid walking on wet floors. Keep items that you use a lot in easy-to-reach places. If you need to reach something above you, use a strong step stool that has a grab bar. Keep electrical cords out of the way. Do not use floor polish or wax that makes floors slippery. If you must use wax, use non-skid floor wax. Do not have throw rugs and other things on the floor that can make you trip. What can I do with my stairs? Do not leave any items on the stairs. Make sure that there are handrails on both sides of the stairs and use them. Fix handrails that are broken or loose. Make sure that handrails are as long as the stairways. Check any carpeting to make sure that  it is firmly attached to the stairs. Fix any carpet that is loose or worn. Avoid having throw rugs at the top or bottom of the stairs. If you do have throw rugs, attach them to the floor with carpet tape. Make sure that you have a light switch at the top of the stairs and the bottom of the stairs. If you do not have them, ask someone to add them for you. What else can I do to help prevent falls? Wear shoes that: Do not have high heels. Have rubber bottoms. Are comfortable and fit you well. Are closed at the toe. Do not wear sandals. If you use a stepladder: Make sure that it is fully opened. Do not climb a closed stepladder. Make sure that both sides of the stepladder are locked into place. Ask someone to  hold it for you, if possible. Clearly mark and make sure that you can see: Any grab bars or handrails. First and last steps. Where the edge of each step is. Use tools that help you move around (mobility aids) if they are needed. These include: Canes. Walkers. Scooters. Crutches. Turn on the lights when you go into a dark area. Replace any light bulbs as soon as they burn out. Set up your furniture so you have a clear path. Avoid moving your furniture around. If any of your floors are uneven, fix them. If there are any pets around you, be aware of where they are. Review your medicines with your doctor. Some medicines can make you feel dizzy. This can increase your chance of falling. Ask your doctor what other things that you can do to help prevent falls. This information is not intended to replace advice given to you by your health care provider. Make sure you discuss any questions you have with your health care provider. Document Released: 10/06/2009 Document Revised: 05/17/2016 Document Reviewed: 01/14/2015 Elsevier Interactive Patient Education  2017 [employer].

## 2022-07-31 NOTE — Progress Notes (Signed)
Virtual Visit via Telephone Note  I connected with  Christopher Haynes on 07/31/22 at  2:30 PM EDT by telephone and verified that I am speaking with the correct person using two identifiers.  Medicare Annual Wellness visit completed telephonically due to Covid-19 pandemic.   Persons participating in this call: This Health Coach and this patient.   Location: Patient: home Provider: office   I discussed the limitations, risks, security and privacy concerns of performing an evaluation and management service by telephone and the availability of in person appointments. The patient expressed understanding and agreed to proceed.  Unable to perform video visit due to video visit attempted and failed and/or patient does not have video capability.   Some vital signs may be absent or patient reported.   Marzella Schlein, LPN   Subjective:   Christopher Haynes is a 76 y.o. male who presents for Medicare Annual/Subsequent preventive examination.  Review of Systems     Cardiac Risk Factors include: advanced age (>30men, >59 women);dyslipidemia;male gender     Objective:    There were no vitals filed for this visit. There is no height or weight on file to calculate BMI.     07/31/2022    2:32 PM 05/04/2020   10:02 AM 12/13/2017   11:12 AM 12/06/2017   11:24 AM 02/11/2017    6:31 PM 02/16/2016    3:08 PM  Advanced Directives  Does Patient Have a Medical Advance Directive? Yes Yes No No No Yes  Type of Estate agent of [employer] Power of [place];Living will    Healthcare Power of [place];Living will  Does patient want to make changes to medical advance directive?  No - Patient declined      Copy of Healthcare Power of Attorney in Chart? No - copy requested No - copy requested      Would patient like information on creating a medical advance directive?   No - Patient declined  Yes (MAU/Ambulatory/Procedural Areas - Information given)     Current Medications  (verified) Outpatient Encounter Medications as of 07/31/2022  Medication Sig   Calcium Carbonate (CALCIUM 500 PO) Take by mouth.   cetirizine (ZYRTEC) 10 MG tablet Take 10 mg by mouth daily.   Cholecalciferol 50 MCG (2000 UT) TABS Take 1 tablet by mouth daily.   cyclobenzaprine (FLEXERIL) 10 MG tablet TAKE ONE TABLET BY MOUTH AT BEDTIME AS NEEDED FOR MUSCLE SPASMS   diazepam (VALIUM) 10 MG tablet TAKE 1 TABLET BY MOUTH ONCE DAILY AS NEEDED   diclofenac sodium (VOLTAREN) 1 % GEL Apply 2 g topically 4 (four) times daily.   EPINEPHrine 0.3 mg/0.3 mL IJ SOAJ injection INJECT 0.3 ML 0.3MG /0.3ML INTRAMUSCULARLY ONCE AS NEEDED USE FOR INSTANCES OF THROAT SWELLING AND DIFFICULTY BREATHING--THEN GO TO  ER USE FOR INSTANCES OF THROAT SWELLING AND DIFFICULTY BREATHING--THEN GO TO   ER   famotidine (PEPCID) 20 MG tablet Take 1 tablet (20 mg total) by mouth 2 (two) times daily.   finasteride (PROSCAR) 5 MG tablet Take 1 tablet (5 mg total) by mouth daily.   omega-3 fish oil (MAXEPA) 1000 MG CAPS capsule Take 2 capsules by mouth daily.   sildenafil (VIAGRA) 100 MG tablet Take 1 tablet (100 mg total) by mouth 60 minutes prior to intercourse.   tamsulosin (FLOMAX) 0.4 MG CAPS capsule Take 0.8 mg by mouth daily.    traMADol (ULTRAM) 50 MG tablet Take 1 tablet (50 mg total) by mouth daily as needed.   [DISCONTINUED] HYDROcodone bit-homatropine (  HYCODAN) 5-1.5 MG/5ML syrup Take 5 mLs by mouth every 8 (eight) hours as needed for cough.   No facility-administered encounter medications on file as of 07/31/2022.    Allergies (verified) Sumatriptan and Tolmetin   History: Past Medical History:  Diagnosis Date   Anxiety 08/05/2017   Back pain 08/20/2016   Benign prostatic hyperplasia (BPH) with urinary urgency 02/09/2016   Constipation 02/20/2016   Contusion 02/20/2016   Elevated PSA    H/O measles    Headache    History of colonic polyps 02/09/2016   [facility]. Now on  5 year plan   Hyperglycemia 08/20/2016   Hyperlipidemia, mixed 02/08/2017   Macular degeneration of left eye 02/19/2016   Migraine with visual aura 02/09/2016   Neck pain 08/05/2017   OSA on CPAP    Pain in joint, shoulder region 02/19/2016   Peripheral neuropathy 08/05/2017   Thyroid disease    thyroiditis, h/o in 61s   Past Surgical History:  Procedure Laterality Date   ANKLE FRACTURE SURGERY  66   KNEE ARTHROSCOPY WITH SUBCHONDROPLASTY Left 12/13/2017   Procedure: KNEE ARTHROSCOPY WITH SUBCHONDROPLASTY;  Surgeon: Marcene Corning, MD;  Location: [facility];  Service: Orthopedics;  Laterality: Left;   SKIN GRAFT  Age 20   TONSILLECTOMY  Age 64   Family History  Problem Relation Age of Onset   Hypertension Mother    Diabetes Mother    Colon cancer Father    Cancer Father        rectal with mets   Hypertension Brother    Obesity Son    Other Son        fatty liver   Allergic Disorder Son        seasonal   Stroke Paternal Grandfather    Social History   Socioeconomic History   Marital status: Married    Spouse name: Not on file   Number of children: Not on file   Years of education: Not on file   Highest education level: Not on file  Occupational History   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  Tobacco Use   Smoking status: Never   Smokeless tobacco: Never  Substance and Sexual Activity   Alcohol use: Yes    Alcohol/week: 0.0 standard drinks of alcohol   Drug use: No   Sexual activity: Yes    Comment: lives with wife, work in Audiological scientist estate, no dietary restrictions  Other Topics Concern   Not on file  Social History Narrative   Not on file   Social Determinants of Health   Financial Resource Strain: Low Risk  (07/31/2022)   Overall Financial Resource Strain (CARDIA)    Difficulty of Paying Living Expenses: Not hard at all  Food Insecurity: No Food Insecurity (07/31/2022)   Hunger Vital Sign    Worried About Running Out of Food in the Last Year: Never true     Ran Out of Food in the Last Year: Never true  Transportation Needs: No Transportation Needs (07/31/2022)   PRAPARE - Administrator, Civil Service (Medical): No    Lack of Transportation (Non-Medical): No  Physical Activity: Insufficiently Active (07/31/2022)   Exercise Vital Sign    Days of Exercise per Week: 1 day    Minutes of Exercise per Session: 60 min  Stress: No Stress Concern Present (07/31/2022)   [employer] of Occupational Health - Occupational Stress Questionnaire    Feeling of Stress :  Not at all  Social Connections: Moderately Integrated (07/31/2022)   Social Connection and Isolation Panel [NHANES]    Frequency of Communication with Friends and Family: More than three times a week    Frequency of Social Gatherings with Friends and Family: More than three times a week    Attends Religious Services: Never    Database administratorActive Member of Clubs or Organizations: Yes    Attends Engineer, structuralClub or Organization Meetings: 1 to 4 times per year    Marital Status: Married    Tobacco Counseling Counseling given: Not Answered   Clinical Intake:  Pre-visit preparation completed: Yes  Pain : No/denies pain     BMI - recorded: 29.95 Nutritional Status: BMI 25 -29 Overweight Nutritional Risks: None Diabetes: No  How often do you need to have someone help you when you read instructions, pamphlets, or other written materials from your doctor or pharmacy?: 1 - Never  Diabetic?no  Interpreter Needed?: No  Information entered by :: Lanier Ensignina Kaycee Haycraft, LPN   Activities of Daily Living    07/31/2022    2:33 PM 10/19/2021    1:17 PM  In your present state of health, do you have any difficulty performing the following activities:  Hearing? 1 0  Comment wears hearing aid   Vision? 0 0  Difficulty concentrating or making decisions? 0 0  Walking or climbing stairs? 0 0  Dressing or bathing? 0 0  Doing errands, shopping? 0 0  Preparing Food and eating ? N   Using the Toilet? N   In the  past six months, have you accidently leaked urine? N   Do you have problems with loss of bowel control? N   Managing your Medications? N   Managing your Finances? N   Housekeeping or managing your Housekeeping? N     Patient Care Team: Bradd CanaryBlyth, Stacey A, MD as PCP - General (Family Medicine) Pete GlatterStoneking, Danford BadBradley J., MD as Referring Physician (Urology) Mckinley JewelStoneburner, Sara, MD as Consulting Physician (Ophthalmology) Floyde Parkinsyer, Christopher, MD (Unknown Physician Specialty) Cleta AlbertsMulles, Corazon, MD as Referring Physician (Internal Medicine) [facility]  Indicate any recent Medical Services you may have received from other than [facility] providers in the past year (date may be approximate).     Assessment:   This is a routine wellness examination for Christopher Haynes.  Hearing/Vision screen Hearing Screening - Comments:: Pt wears hearing aid  Vision Screening - Comments:: Pt follows up with Dr Elmer PickerHecker for annual eye exams   Dietary issues and exercise activities discussed: Current Exercise Habits: Home exercise routine, Type of exercise: Other - see comments, Time (Minutes): 60, Frequency (Times/Week): 1, Weekly Exercise (Minutes/Week): 60   Goals Addressed             This Visit's Progress    Patient Stated       Lose 10 lbs        Depression Screen    07/31/2022    2:29 PM 02/23/2021    1:57 PM 08/25/2020   10:08 AM 02/24/2017    9:57 PM 08/10/2016    9:09 AM  PHQ 2/9 Scores  PHQ - 2 Score 1 0 1 0 0  PHQ- 9 Score   1      Fall Risk    07/31/2022    2:33 PM 02/23/2021    1:57 PM 02/22/2020    8:45 AM 07/25/2018    5:15 PM 02/24/2017    9:57 PM  Fall Risk   Falls in the past year? 0 0 0  No No  Comment    Emmi Telephone Survey: data to providers prior to load   Number falls in past yr: 0 0 0    Injury with Fall? 0 0 0    Risk for fall due to : Impaired vision      Follow up Falls prevention discussed        FALL RISK PREVENTION PERTAINING TO THE HOME:  Any stairs in or around the home? Yes   If so, are there any without handrails? No  Home free of loose throw rugs in walkways, pet beds, electrical cords, etc? Yes  Adequate lighting in your home to reduce risk of falls? Yes   ASSISTIVE DEVICES UTILIZED TO PREVENT FALLS:  Life alert? No  Use of a cane, walker or w/c? No  Grab bars in the bathroom? Yes  Shower chair or bench in shower? No  Elevated toilet seat or a handicapped toilet? No   TIMED UP AND GO:  Was the test performed? No .  Cognitive Function:        07/31/2022    2:35 PM  6CIT Screen  What Year? 0 points  What month? 0 points  What time? 0 points  Count back from 20 0 points  Months in reverse 0 points  Repeat phrase 2 points  Total Score 2 points    Immunizations Immunization History  Administered Date(s) Administered   Fluad Quad(high Dose 65+) 08/24/2019, 09/16/2020, 09/15/2021   Influenza, High Dose Seasonal PF 09/24/2016   Influenza,inj,Quad PF,6+ Mos 10/21/2017   Influenza-Unspecified 02/15/2005, 10/11/2006, 10/09/2007, 09/28/2008, 11/30/2009, 09/20/2010, 09/28/2011, 09/18/2012, 11/04/2013, 10/19/2014, 09/24/2015, 11/09/2015, 09/23/2018   PFIZER Comirnaty(Gray Top)Covid-19 Tri-Sucrose Vaccine 05/05/2021   PFIZER(Purple Top)SARS-COV-2 Vaccination 01/15/2020, 02/05/2020, 10/23/2020   Pfizer Covid-19 Vaccine Bivalent Booster 107yrs & up 09/15/2021   Pneumococcal Conjugate-13 05/25/2014, 01/29/2018, 10/19/2021   Pneumococcal-Unspecified 12/24/2009, 05/14/2011   Tdap 12/25/2011, 03/24/2012   Zoster Recombinat (Shingrix) 04/08/2018, 07/08/2018    TDAP status: Due, Education has been provided regarding the importance of this vaccine. Advised may receive this vaccine at local pharmacy or Health Dept. Aware to provide a copy of the vaccination record if obtained from local pharmacy or Health Dept. Verbalized acceptance and understanding.  Flu Vaccine status: Up to date  Pneumococcal vaccine status: Up to date  Covid-19 vaccine status: Completed  vaccines  Qualifies for Shingles Vaccine? Yes   Zostavax completed Yes   Shingrix Completed?: Yes  Screening Tests Health Maintenance  Topic Date Due   COLONOSCOPY (Pts 45-73yrs Insurance coverage will need to be confirmed)  12/24/2018   TETANUS/TDAP  03/24/2022   INFLUENZA VACCINE  07/24/2022   Pneumonia Vaccine 38+ Years old (2 - PPSV23 or PCV20) 10/19/2022   COVID-19 Vaccine  Completed   Hepatitis C Screening  Completed   Zoster Vaccines- Shingrix  Completed   HPV VACCINES  Aged Out    Health Maintenance  Health Maintenance Due  Topic Date Due   COLONOSCOPY (Pts 45-29yrs Insurance coverage will need to be confirmed)  12/24/2018   TETANUS/TDAP  03/24/2022   INFLUENZA VACCINE  07/24/2022   Pneumonia Vaccine 25+ Years old (2 - PPSV23 or PCV20) 10/19/2022    Colorectal cancer screening: Type of screening: Colonoscopy. Completed 12/24/13. Repeat every 5 years pt stated he completed through the [place]   Additional Screening:  Hepatitis C Screening: Completed 04/15/17  Vision Screening: Recommended annual ophthalmology exams for early detection of glaucoma and other disorders of the eye. Is the patient up to date with their  annual eye exam?  Yes  Who is the provider or what is the name of the office in which the patient attends annual eye exams? Dr Elmer Picker  If pt is not established with a provider, would they like to be referred to a provider to establish care? No .   Dental Screening: Recommended annual dental exams for proper oral hygiene  Community Resource Referral / Chronic Care Management: CRR required this visit?  No   CCM required this visit?  No      Plan:     I have personally reviewed and noted the following in the patient's chart:   Medical and social history Use of alcohol, tobacco or illicit drugs  Current medications and supplements including opioid prescriptions. Patient is currently taking opioid prescriptions. Information provided to patient regarding  non-opioid alternatives. Patient advised to discuss non-opioid treatment plan with their provider. Functional ability and status Nutritional status Physical activity Advanced directives List of other physicians Hospitalizations, surgeries, and ER visits in previous 12 months Vitals Screenings to include cognitive, depression, and falls Referrals and appointments  In addition, I have reviewed and discussed with patient certain preventive protocols, quality metrics, and best practice recommendations. A written personalized care plan for preventive services as well as general preventive health recommendations were provided to patient.     Marzella Schlein, LPN   02/25/4561   Nurse Notes: none

## 2022-08-20 ENCOUNTER — Other Ambulatory Visit: Payer: Self-pay | Admitting: Family Medicine

## 2022-08-20 ENCOUNTER — Other Ambulatory Visit (HOSPITAL_BASED_OUTPATIENT_CLINIC_OR_DEPARTMENT_OTHER): Payer: Self-pay

## 2022-08-20 MED ORDER — DIAZEPAM 10 MG PO TABS
ORAL_TABLET | Freq: Every day | ORAL | 1 refills | Status: DC | PRN
  Filled 2022-08-20: qty 30, 30d supply, fill #0
  Filled 2022-09-28: qty 30, 30d supply, fill #1

## 2022-08-20 NOTE — Telephone Encounter (Signed)
Requesting: diazepam 10mg   Contract: 10/19/21 UDS: 10/19/21 Last Visit: 10/19/21  Next Visit:  None  Last Refill: 04/16/22 #30 and 0RF  Please Advise

## 2022-08-21 ENCOUNTER — Other Ambulatory Visit (HOSPITAL_BASED_OUTPATIENT_CLINIC_OR_DEPARTMENT_OTHER): Payer: Self-pay

## 2022-08-24 ENCOUNTER — Other Ambulatory Visit (HOSPITAL_BASED_OUTPATIENT_CLINIC_OR_DEPARTMENT_OTHER): Payer: Self-pay

## 2022-09-14 LAB — HEMOGLOBIN A1C: A1c: 6

## 2022-09-28 ENCOUNTER — Other Ambulatory Visit (HOSPITAL_BASED_OUTPATIENT_CLINIC_OR_DEPARTMENT_OTHER): Payer: Self-pay

## 2022-10-21 NOTE — Assessment & Plan Note (Addendum)
Patient encouraged to maintain heart healthy diet, regular exercise, adequate sleep. Consider daily probiotics. Take medications as prescribed. Labs ordered and reviewed Colonoscopy was roughly 8 years ago per patient Last Tetanus 2013, repeat in 10 years RSV (respiratory syncitial virus) vaccine at pharmacy, Arexvy Covid booster new version at pharmacy High dose flu shot

## 2022-10-21 NOTE — Assessment & Plan Note (Signed)
hgba1c acceptable, minimize simple carbs. Increase exercise as tolerated.  

## 2022-10-21 NOTE — Assessment & Plan Note (Signed)
Encouraged DASH or MIND diet, decrease po intake and increase exercise as tolerated. Needs 7-8 hours of sleep nightly. Avoid trans fats, eat small, frequent meals every 4-5 hours with lean proteins, complex carbs and healthy fats. Minimize simple carbs, high fat foods and processed foods 

## 2022-10-21 NOTE — Assessment & Plan Note (Signed)
Supplement and monitor 

## 2022-10-21 NOTE — Progress Notes (Signed)
Subjective:    Patient ID: Christopher Haynes, male    DOB: 11-22-1946, 76 y.o.   MRN: [id number]  No chief complaint on file.   HPI Patient is in today for annual preventative exam and follow up on chronic medical concerns. No recent febrile illness or acute hospitalizations. He is trying to maintain a heart healthy diet and to stay active. Denies CP/palp/SOB/HA/congestion/fevers/GI or GU c/o. Taking meds as prescribed   Past Medical History:  Diagnosis Date   Anxiety 08/05/2017   Back pain 08/20/2016   Benign prostatic hyperplasia (BPH) with urinary urgency 02/09/2016   Constipation 02/20/2016   Contusion 02/20/2016   Elevated PSA    H/O measles    Headache    History of colonic polyps 02/09/2016   [facility]. Now on 5 year plan   Hyperglycemia 08/20/2016   Hyperlipidemia, mixed 02/08/2017   Macular degeneration of left eye 02/19/2016   Migraine with visual aura 02/09/2016   Neck pain 08/05/2017   OSA on CPAP    Pain in joint, shoulder region 02/19/2016   Peripheral neuropathy 08/05/2017   Thyroid disease    thyroiditis, h/o in 33s    Past Surgical History:  Procedure Laterality Date   ANKLE FRACTURE SURGERY  66   KNEE ARTHROSCOPY WITH SUBCHONDROPLASTY Left 12/13/2017   Procedure: KNEE ARTHROSCOPY WITH SUBCHONDROPLASTY;  Surgeon: Melrose Nakayama, MD;  Location: [facility];  Service: Orthopedics;  Laterality: Left;   SKIN GRAFT  Age 18   TONSILLECTOMY  Age 40    Family History  Problem Relation Age of Onset   Hypertension Mother    Diabetes Mother    Colon cancer Father    Cancer Father        rectal with mets   Hypertension Brother    Obesity Son    Other Son        fatty liver   Allergic Disorder Son        seasonal   Stroke Paternal Grandfather     Social History   Socioeconomic History   Marital status: Married    Spouse name: Not on file   Number of children: Not on file   Years of education: Not on file    Highest education level: Not on file  Occupational History   Occupation: Real Conservation officer, historic buildings  Tobacco Use   Smoking status: Never   Smokeless tobacco: Never  Substance and Sexual Activity   Alcohol use: Yes    Alcohol/week: 0.0 standard drinks of alcohol   Drug use: No   Sexual activity: Yes    Comment: lives with wife, work in Scientist, research (life sciences) estate, no dietary restrictions  Other Topics Concern   Not on file  Social History Narrative   Not on file   Social Determinants of Health   Financial Resource Strain: Low Risk  (07/31/2022)   Overall Financial Resource Strain (CARDIA)    Difficulty of Paying Living Expenses: Not hard at all  Food Insecurity: No Food Insecurity (07/31/2022)   Hunger Vital Sign    Worried About Running Out of Food in the Last Year: Never true    [place] in the Last Year: Never true  Transportation Needs: No Transportation Needs (07/31/2022)   PRAPARE - Hydrologist (Medical): No    Lack of Transportation (Non-Medical): No  Physical Activity: Insufficiently Active (07/31/2022)   Exercise Vital Sign    Days of Exercise  per Week: 1 day    Minutes of Exercise per Session: 60 min  Stress: No Stress Concern Present (07/31/2022)   [employer] of Occupational Health - Occupational Stress Questionnaire    Feeling of Stress : Not at all  Social Connections: Moderately Integrated (07/31/2022)   Social Connection and Isolation Panel [NHANES]    Frequency of Communication with Friends and Family: More than three times a week    Frequency of Social Gatherings with Friends and Family: More than three times a week    Attends Religious Services: Never    Database administrator or Organizations: Yes    Attends Banker Meetings: 1 to 4 times per year    Marital Status: Married  Catering manager Violence: Not At Risk (07/31/2022)   Humiliation, Afraid, Rape, and Kick questionnaire    Fear of Current or Ex-Partner: No    Emotionally  Abused: No    Physically Abused: No    Sexually Abused: No    Outpatient Medications Prior to Visit  Medication Sig Dispense Refill   Calcium Carbonate (CALCIUM 500 PO) Take by mouth.     cetirizine (ZYRTEC) 10 MG tablet Take 10 mg by mouth daily.     Cholecalciferol 50 MCG (2000 UT) TABS Take 1 tablet by mouth daily.     cyclobenzaprine (FLEXERIL) 10 MG tablet TAKE ONE TABLET BY MOUTH AT BEDTIME AS NEEDED FOR MUSCLE SPASMS     diazepam (VALIUM) 10 MG tablet TAKE 1 TABLET BY MOUTH ONCE DAILY AS NEEDED 30 tablet 1   diclofenac sodium (VOLTAREN) 1 % GEL Apply 2 g topically 4 (four) times daily.     EPINEPHrine 0.3 mg/0.3 mL IJ SOAJ injection INJECT 0.3 ML 0.3MG /0.3ML INTRAMUSCULARLY ONCE AS NEEDED USE FOR INSTANCES OF THROAT SWELLING AND DIFFICULTY BREATHING--THEN GO TO  ER USE FOR INSTANCES OF THROAT SWELLING AND DIFFICULTY BREATHING--THEN GO TO   ER     famotidine (PEPCID) 20 MG tablet Take 1 tablet (20 mg total) by mouth 2 (two) times daily. 60 tablet    finasteride (PROSCAR) 5 MG tablet Take 1 tablet (5 mg total) by mouth daily. 90 tablet 3   omega-3 fish oil (MAXEPA) 1000 MG CAPS capsule Take 2 capsules by mouth daily.     sildenafil (VIAGRA) 100 MG tablet Take 1 tablet (100 mg total) by mouth 60 minutes prior to intercourse. 10 tablet 11   tamsulosin (FLOMAX) 0.4 MG CAPS capsule Take 0.8 mg by mouth daily.      traMADol (ULTRAM) 50 MG tablet Take 1 tablet (50 mg total) by mouth daily as needed. 30 tablet 0   No facility-administered medications prior to visit.    Allergies  Allergen Reactions   Sumatriptan Nausea And Vomiting   Tolmetin     Other reaction(s): Other (See Comments)    ROS     Objective:    Physical Exam  There were no vitals taken for this visit. Wt Readings from Last 3 Encounters:  02/23/22 180 lb (81.6 kg)  11/24/21 186 lb (84.4 kg)  10/19/21 178 lb 3.2 oz (80.8 kg)    Diabetic Foot Exam - Simple   No data filed    Lab Results  Component Value  Date   WBC 4.7 09/24/2021   HGB 14.4 09/24/2021   HCT 42 09/24/2021   PLT 225 09/24/2021   GLUCOSE 95 06/01/2021   CHOL 218 (A) 09/24/2021   TRIG 105 09/24/2021   HDL 59 09/24/2021   LDLCALC 138  09/24/2021   ALT 30 09/24/2021   AST 19 09/24/2021   NA 140 09/24/2021   K 4.3 09/24/2021   CL 104 09/24/2021   CREATININE 1.0 09/24/2021   BUN 19 09/24/2021   CO2 29 (A) 09/24/2021   TSH 2.94 09/24/2021   PSA 9.530 09/24/2021   HGBA1C 6.0 09/24/2021    Lab Results  Component Value Date   TSH 2.94 09/24/2021   Lab Results  Component Value Date   WBC 4.7 09/24/2021   HGB 14.4 09/24/2021   HCT 42 09/24/2021   MCV 88.7 02/23/2021   PLT 225 09/24/2021   Lab Results  Component Value Date   NA 140 09/24/2021   K 4.3 09/24/2021   CO2 29 (A) 09/24/2021   GLUCOSE 95 06/01/2021   BUN 19 09/24/2021   CREATININE 1.0 09/24/2021   BILITOT 0.6 06/01/2021   ALKPHOS 111 09/24/2021   AST 19 09/24/2021   ALT 30 09/24/2021   PROT 6.7 06/01/2021   ALBUMIN 3.7 09/24/2021   CALCIUM 9.0 09/24/2021   GFR 82.70 06/01/2021   Lab Results  Component Value Date   CHOL 218 (A) 09/24/2021   Lab Results  Component Value Date   HDL 59 09/24/2021   Lab Results  Component Value Date   LDLCALC 138 09/24/2021   Lab Results  Component Value Date   TRIG 105 09/24/2021   Lab Results  Component Value Date   CHOLHDL 4 06/01/2021   Lab Results  Component Value Date   HGBA1C 6.0 09/24/2021       Assessment & Plan:   Problem List Items Addressed This Visit     Hyperglycemia    hgba1c acceptable, minimize simple carbs. Increase exercise as tolerated.       Hyperlipidemia, mixed    Encourage heart healthy diet such as MIND or DASH diet, increase exercise, avoid trans fats, simple carbohydrates and processed foods, consider a krill or fish or flaxseed oil cap daily.       Preventative health care    Patient encouraged to maintain heart healthy diet, regular exercise, adequate sleep.  Consider daily probiotics. Take medications as prescribed. Labs ordered and reviewed Colonoscopy was roughly 8 years ago per patient Last Tetanus 2013, repeat in 10 years RSV (respiratory syncitial virus) vaccine at pharmacy, Arexvy Covid booster new version at pharmacy High dose flu shot       Vitamin D deficiency    Supplement and monitor      Obesity    Encouraged DASH or MIND diet, decrease po intake and increase exercise as tolerated. Needs 7-8 hours of sleep nightly. Avoid trans fats, eat small, frequent meals every 4-5 hours with lean proteins, complex carbs and healthy fats. Minimize simple carbs, high fat foods and processed foods       I am having Lurline Idol maintain his traMADol, tamsulosin, diclofenac sodium, famotidine, cetirizine, cyclobenzaprine, omega-3 fish oil, finasteride, sildenafil, Cholecalciferol, EPINEPHrine, Calcium Carbonate (CALCIUM 500 PO), and diazepam.  No orders of the defined types were placed in this encounter.    Danise Edge, MD

## 2022-10-21 NOTE — Assessment & Plan Note (Signed)
Encourage heart healthy diet such as MIND or DASH diet, increase exercise, avoid trans fats, simple carbohydrates and processed foods, consider a krill or fish or flaxseed oil cap daily.  °

## 2022-10-22 ENCOUNTER — Ambulatory Visit (INDEPENDENT_AMBULATORY_CARE_PROVIDER_SITE_OTHER): Payer: Medicare Other | Admitting: Family Medicine

## 2022-10-22 VITALS — BP 128/76 | HR 66 | Temp 97.0°F | Resp 16 | Ht 65.0 in | Wt 186.0 lb

## 2022-10-22 DIAGNOSIS — G4733 Obstructive sleep apnea (adult) (pediatric): Secondary | ICD-10-CM

## 2022-10-22 DIAGNOSIS — R739 Hyperglycemia, unspecified: Secondary | ICD-10-CM

## 2022-10-22 DIAGNOSIS — E559 Vitamin D deficiency, unspecified: Secondary | ICD-10-CM | POA: Diagnosis not present

## 2022-10-22 DIAGNOSIS — E782 Mixed hyperlipidemia: Secondary | ICD-10-CM

## 2022-10-22 DIAGNOSIS — Z23 Encounter for immunization: Secondary | ICD-10-CM

## 2022-10-22 DIAGNOSIS — F419 Anxiety disorder, unspecified: Secondary | ICD-10-CM

## 2022-10-22 DIAGNOSIS — Z0000 Encounter for general adult medical examination without abnormal findings: Secondary | ICD-10-CM

## 2022-10-22 DIAGNOSIS — E669 Obesity, unspecified: Secondary | ICD-10-CM | POA: Diagnosis not present

## 2022-10-22 DIAGNOSIS — Z79899 Other long term (current) drug therapy: Secondary | ICD-10-CM | POA: Diagnosis not present

## 2022-10-22 NOTE — Assessment & Plan Note (Signed)
Doing some better but still uses Diazepam prn sparingly with good results and no side effects will update UDS today

## 2022-10-22 NOTE — Assessment & Plan Note (Signed)
Using cpap nightly 

## 2022-10-22 NOTE — Patient Instructions (Addendum)
RSV, Respiratory Syncitial Virus, Arexvy at pharmacy Hydrate 60-80 ounces or more a day Sleep 6-8 hours a night 4000 steps minimun 8000 steps bonus   Magnesium Glycinate 200-400 mg at betime L Tryptophan capsules for sleep  Avoid ultra processed foods especially simple carbs.  Preventive Care 53 Years and Older, Male Preventive care refers to lifestyle choices and visits with your health care provider that can promote health and wellness. Preventive care visits are also called wellness exams. What can I expect for my preventive care visit? Counseling During your preventive care visit, your health care provider may ask about your: Medical history, including: Past medical problems. Family medical history. History of falls. Current health, including: Emotional well-being. Home life and relationship well-being. Sexual activity. Memory and ability to understand (cognition). Lifestyle, including: Alcohol, nicotine or tobacco, and drug use. Access to firearms. Diet, exercise, and sleep habits. Work and work Astronomer. Sunscreen use. Safety issues such as seatbelt and bike helmet use. Physical exam Your health care provider will check your: Height and weight. These may be used to calculate your BMI (body mass index). BMI is a measurement that tells if you are at a healthy weight. Waist circumference. This measures the distance around your waistline. This measurement also tells if you are at a healthy weight and may help predict your risk of certain diseases, such as type 2 diabetes and high blood pressure. Heart rate and blood pressure. Body temperature. Skin for abnormal spots. What immunizations do I need?  Vaccines are usually given at various ages, according to a schedule. Your health care provider will recommend vaccines for you based on your age, medical history, and lifestyle or other factors, such as travel or where you work. What tests do I need? Screening Your health  care provider may recommend screening tests for certain conditions. This may include: Lipid and cholesterol levels. Diabetes screening. This is done by checking your blood sugar (glucose) after you have not eaten for a while (fasting). Hepatitis C test. Hepatitis B test. HIV (human immunodeficiency virus) test. STI (sexually transmitted infection) testing, if you are at risk. Lung cancer screening. Colorectal cancer screening. Prostate cancer screening. Abdominal aortic aneurysm (AAA) screening. You may need this if you are a current or former smoker. Talk with your health care provider about your test results, treatment options, and if necessary, the need for more tests. Follow these instructions at home: Eating and drinking  Eat a diet that includes fresh fruits and vegetables, whole grains, lean protein, and low-fat dairy products. Limit your intake of foods with high amounts of sugar, saturated fats, and salt. Take vitamin and mineral supplements as recommended by your health care provider. Do not drink alcohol if your health care provider tells you not to drink. If you drink alcohol: Limit how much you have to 0-2 drinks a day. Know how much alcohol is in your drink. In the [place] one drink equals one 12 oz bottle of beer (355 mL), one 5 oz glass of wine (148 mL), or one 1 oz glass of hard liquor (44 mL). Lifestyle Brush your teeth every morning and night with fluoride toothpaste. Floss one time each day. Exercise for at least 30 minutes 5 or more days each week. Do not use any products that contain nicotine or tobacco. These products include cigarettes, chewing tobacco, and vaping devices, such as e-cigarettes. If you need help quitting, ask your health care provider. Do not use drugs. If you are sexually active, practice safe sex. Use  a condom or other form of protection to prevent STIs. Take aspirin only as told by your health care provider. Make sure that you understand how much  to take and what form to take. Work with your health care provider to find out whether it is safe and beneficial for you to take aspirin daily. Ask your health care provider if you need to take a cholesterol-lowering medicine (statin). Find healthy ways to manage stress, such as: Meditation, yoga, or listening to music. Journaling. Talking to a trusted person. Spending time with friends and family. Safety Always wear your seat belt while driving or riding in a vehicle. Do not drive: If you have been drinking alcohol. Do not ride with someone who has been drinking. When you are tired or distracted. While texting. If you have been using any mind-altering substances or drugs. Wear a helmet and other protective equipment during sports activities. If you have firearms in your house, make sure you follow all gun safety procedures. Minimize exposure to UV radiation to reduce your risk of skin cancer. What's next? Visit your health care provider once a year for an annual wellness visit. Ask your health care provider how often you should have your eyes and teeth checked. Stay up to date on all vaccines. This information is not intended to replace advice given to you by your health care provider. Make sure you discuss any questions you have with your health care provider. Document Revised: 06/07/2021 Document Reviewed: 06/07/2021 Elsevier Patient Education  [employer].

## 2022-10-24 LAB — DRUG MONITORING PANEL 376104, URINE
Alphahydroxyalprazolam: NEGATIVE ng/mL (ref <25)
Alphahydroxymidazolam: NEGATIVE ng/mL (ref <50)
Alphahydroxytriazolam: NEGATIVE ng/mL (ref <50)
Aminoclonazepam: NEGATIVE ng/mL (ref <25)
Amphetamines: NEGATIVE ng/mL (ref <500)
Barbiturates: NEGATIVE ng/mL (ref <300)
Benzodiazepines: POSITIVE ng/mL — AB (ref <100)
Cocaine Metabolite: NEGATIVE ng/mL (ref <150)
Desmethyltramadol: NEGATIVE ng/mL (ref <100)
Hydroxyethylflurazepam: NEGATIVE ng/mL (ref <50)
Lorazepam: NEGATIVE ng/mL (ref <50)
Nordiazepam: 414 ng/mL — ABNORMAL HIGH (ref <50)
Opiates: NEGATIVE ng/mL (ref <100)
Oxazepam: 652 ng/mL — ABNORMAL HIGH (ref <50)
Oxycodone: NEGATIVE ng/mL (ref <100)
Temazepam: 823 ng/mL — ABNORMAL HIGH (ref <50)
Tramadol: NEGATIVE ng/mL (ref <100)

## 2022-10-24 LAB — DM TEMPLATE

## 2022-10-26 ENCOUNTER — Telehealth: Payer: Self-pay | Admitting: Family Medicine

## 2022-10-26 NOTE — Telephone Encounter (Signed)
Pt dropped off copy of Living Will for provider to have on pt's chart. Document put at front office tray under providers name.

## 2022-10-29 NOTE — Telephone Encounter (Signed)
Got it.

## 2022-11-08 ENCOUNTER — Other Ambulatory Visit (HOSPITAL_BASED_OUTPATIENT_CLINIC_OR_DEPARTMENT_OTHER): Payer: Self-pay

## 2022-11-08 ENCOUNTER — Other Ambulatory Visit: Payer: Self-pay | Admitting: Family Medicine

## 2022-11-08 MED ORDER — DIAZEPAM 10 MG PO TABS
10.0000 mg | ORAL_TABLET | Freq: Every day | ORAL | 1 refills | Status: DC | PRN
  Filled 2022-11-08: qty 30, 30d supply, fill #0
  Filled 2022-12-19: qty 30, 30d supply, fill #1

## 2022-11-08 NOTE — Telephone Encounter (Signed)
Requesting: Valium Contract: 10/22/2022 UDS: 10/22/2022 Last Visit: 10/22/2022 Next Visit:10/24/2023 Last Refill: 08/20/2022  Please Advise

## 2022-12-19 ENCOUNTER — Other Ambulatory Visit: Payer: Self-pay

## 2022-12-20 ENCOUNTER — Other Ambulatory Visit (HOSPITAL_BASED_OUTPATIENT_CLINIC_OR_DEPARTMENT_OTHER): Payer: Self-pay

## 2022-12-20 MED ORDER — IBUPROFEN 800 MG PO TABS
800.0000 mg | ORAL_TABLET | Freq: Four times a day (QID) | ORAL | 0 refills | Status: DC
  Filled 2022-12-20: qty 40, 10d supply, fill #0

## 2022-12-20 MED ORDER — AMOXICILLIN 500 MG PO CAPS
500.0000 mg | ORAL_CAPSULE | Freq: Three times a day (TID) | ORAL | 0 refills | Status: DC
  Filled 2022-12-20: qty 21, 7d supply, fill #0

## 2023-01-16 DIAGNOSIS — K080 Exfoliation of teeth due to systemic causes: Secondary | ICD-10-CM | POA: Diagnosis not present

## 2023-01-20 ENCOUNTER — Other Ambulatory Visit: Payer: Self-pay | Admitting: Family Medicine

## 2023-01-21 ENCOUNTER — Other Ambulatory Visit (HOSPITAL_BASED_OUTPATIENT_CLINIC_OR_DEPARTMENT_OTHER): Payer: Self-pay

## 2023-01-21 MED ORDER — DIAZEPAM 10 MG PO TABS
10.0000 mg | ORAL_TABLET | Freq: Every day | ORAL | 1 refills | Status: DC | PRN
  Filled 2023-01-21: qty 30, 30d supply, fill #0
  Filled 2023-02-22: qty 30, 30d supply, fill #1

## 2023-01-21 NOTE — Telephone Encounter (Signed)
Requesting:valium 10 mg Contract:10/22/22 UDS:10/22/22 Last Visit:10/22/22 Next Visit:10/24/23 Last Refill:11/08/22  Please Advise

## 2023-03-31 ENCOUNTER — Other Ambulatory Visit: Payer: Self-pay | Admitting: Family Medicine

## 2023-04-01 MED ORDER — DIAZEPAM 10 MG PO TABS
10.0000 mg | ORAL_TABLET | Freq: Every day | ORAL | 1 refills | Status: DC | PRN
  Filled 2023-04-01: qty 30, 30d supply, fill #0
  Filled 2023-05-01: qty 30, 30d supply, fill #1

## 2023-04-01 NOTE — Telephone Encounter (Signed)
Requesting: diazepam 10mg   Contract: 10/30/22 UDS: 10/22/22 Last Visit: 10/22/22 Next Visit: 10/24/23 Last Refill: 01/21/23 #30 and 0RF   Please Advise

## 2023-04-02 ENCOUNTER — Other Ambulatory Visit: Payer: Self-pay

## 2023-04-02 ENCOUNTER — Other Ambulatory Visit (HOSPITAL_BASED_OUTPATIENT_CLINIC_OR_DEPARTMENT_OTHER): Payer: Self-pay

## 2023-04-23 DIAGNOSIS — K080 Exfoliation of teeth due to systemic causes: Secondary | ICD-10-CM | POA: Diagnosis not present

## 2023-05-01 ENCOUNTER — Other Ambulatory Visit (HOSPITAL_BASED_OUTPATIENT_CLINIC_OR_DEPARTMENT_OTHER): Payer: Self-pay

## 2023-05-01 ENCOUNTER — Other Ambulatory Visit: Payer: Self-pay

## 2023-05-29 ENCOUNTER — Other Ambulatory Visit: Payer: Self-pay | Admitting: Family Medicine

## 2023-05-30 MED ORDER — DIAZEPAM 10 MG PO TABS
10.0000 mg | ORAL_TABLET | Freq: Every day | ORAL | 1 refills | Status: DC | PRN
  Filled 2023-05-30: qty 30, 30d supply, fill #0
  Filled 2023-06-30: qty 30, 30d supply, fill #1

## 2023-05-30 NOTE — Telephone Encounter (Signed)
Requesting: diazepam 10mg   Contract: 10/22/22 UDS: 10/22/22 Last Visit: 10/22/22 Next Visit: 10/24/23 Last Refill: 04/01/23 #30 and 0RF   Please Advise

## 2023-05-31 ENCOUNTER — Other Ambulatory Visit: Payer: Self-pay

## 2023-05-31 ENCOUNTER — Other Ambulatory Visit (HOSPITAL_BASED_OUTPATIENT_CLINIC_OR_DEPARTMENT_OTHER): Payer: Self-pay

## 2023-07-01 ENCOUNTER — Other Ambulatory Visit: Payer: Self-pay

## 2023-07-01 DIAGNOSIS — H524 Presbyopia: Secondary | ICD-10-CM | POA: Diagnosis not present

## 2023-07-01 DIAGNOSIS — H25813 Combined forms of age-related cataract, bilateral: Secondary | ICD-10-CM | POA: Diagnosis not present

## 2023-07-01 DIAGNOSIS — H04123 Dry eye syndrome of bilateral lacrimal glands: Secondary | ICD-10-CM | POA: Diagnosis not present

## 2023-07-01 DIAGNOSIS — H353131 Nonexudative age-related macular degeneration, bilateral, early dry stage: Secondary | ICD-10-CM | POA: Diagnosis not present

## 2023-07-09 LAB — HM COLONOSCOPY

## 2023-07-24 ENCOUNTER — Encounter (INDEPENDENT_AMBULATORY_CARE_PROVIDER_SITE_OTHER): Payer: Self-pay

## 2023-07-26 ENCOUNTER — Other Ambulatory Visit: Payer: Self-pay | Admitting: Oncology

## 2023-07-26 DIAGNOSIS — Z006 Encounter for examination for normal comparison and control in clinical research program: Secondary | ICD-10-CM

## 2023-08-04 ENCOUNTER — Encounter (HOSPITAL_BASED_OUTPATIENT_CLINIC_OR_DEPARTMENT_OTHER): Payer: Self-pay | Admitting: Emergency Medicine

## 2023-08-04 ENCOUNTER — Emergency Department (HOSPITAL_BASED_OUTPATIENT_CLINIC_OR_DEPARTMENT_OTHER)
Admission: EM | Admit: 2023-08-04 | Discharge: 2023-08-04 | Disposition: A | Discharge status: Home / Self Care | Payer: Medicare Other | Attending: Emergency Medicine | Admitting: Emergency Medicine

## 2023-08-04 ENCOUNTER — Emergency Department (HOSPITAL_BASED_OUTPATIENT_CLINIC_OR_DEPARTMENT_OTHER): Payer: Medicare Other

## 2023-08-04 DIAGNOSIS — K5730 Diverticulosis of large intestine without perforation or abscess without bleeding: Secondary | ICD-10-CM | POA: Diagnosis not present

## 2023-08-04 DIAGNOSIS — N132 Hydronephrosis with renal and ureteral calculous obstruction: Secondary | ICD-10-CM | POA: Diagnosis not present

## 2023-08-04 DIAGNOSIS — N201 Calculus of ureter: Secondary | ICD-10-CM | POA: Diagnosis not present

## 2023-08-04 DIAGNOSIS — N400 Enlarged prostate without lower urinary tract symptoms: Secondary | ICD-10-CM | POA: Diagnosis not present

## 2023-08-04 DIAGNOSIS — N134 Hydroureter: Secondary | ICD-10-CM | POA: Insufficient documentation

## 2023-08-04 DIAGNOSIS — K4090 Unilateral inguinal hernia, without obstruction or gangrene, not specified as recurrent: Secondary | ICD-10-CM | POA: Diagnosis not present

## 2023-08-04 DIAGNOSIS — R1032 Left lower quadrant pain: Secondary | ICD-10-CM | POA: Diagnosis not present

## 2023-08-04 LAB — CBC WITH DIFFERENTIAL/PLATELET
Abs Immature Granulocytes: 0.03 10*3/uL (ref 0.00–0.07)
Basophils Absolute: 0 10*3/uL (ref 0.0–0.1)
Basophils Relative: 0 %
Eosinophils Absolute: 0.1 10*3/uL (ref 0.0–0.5)
Eosinophils Relative: 2 %
HCT: 40.5 % (ref 39.0–52.0)
Hemoglobin: 13.8 g/dL (ref 13.0–17.0)
Immature Granulocytes: 0 %
Lymphocytes Relative: 14 %
Lymphs Abs: 1.2 10*3/uL (ref 0.7–4.0)
MCH: 30.2 pg (ref 26.0–34.0)
MCHC: 34.1 g/dL (ref 30.0–36.0)
MCV: 88.6 fL (ref 80.0–100.0)
Monocytes Absolute: 0.7 10*3/uL (ref 0.1–1.0)
Monocytes Relative: 8 %
Neutro Abs: 6.5 10*3/uL (ref 1.7–7.7)
Neutrophils Relative %: 76 %
Platelets: 217 10*3/uL (ref 150–400)
RBC: 4.57 MIL/uL (ref 4.22–5.81)
RDW: 12.7 % (ref 11.5–15.5)
WBC: 8.6 10*3/uL (ref 4.0–10.5)
nRBC: 0 % (ref 0.0–0.2)

## 2023-08-04 LAB — COMPREHENSIVE METABOLIC PANEL
ALT: 22 U/L (ref 0–44)
AST: 27 U/L (ref 15–41)
Albumin: 3.8 g/dL (ref 3.5–5.0)
Alkaline Phosphatase: 70 U/L (ref 38–126)
Anion gap: 10 (ref 5–15)
BUN: 22 mg/dL (ref 8–23)
CO2: 24 mmol/L (ref 22–32)
Calcium: 9.4 mg/dL (ref 8.9–10.3)
Chloride: 105 mmol/L (ref 98–111)
Creatinine, Ser: 1.4 mg/dL — ABNORMAL HIGH (ref 0.61–1.24)
GFR, Estimated: 52 mL/min — ABNORMAL LOW (ref >60)
Glucose, Bld: 130 mg/dL — ABNORMAL HIGH (ref 70–99)
Potassium: 3.8 mmol/L (ref 3.5–5.1)
Sodium: 139 mmol/L (ref 135–145)
Total Bilirubin: 0.7 mg/dL (ref 0.3–1.2)
Total Protein: 6.5 g/dL (ref 6.5–8.1)

## 2023-08-04 LAB — URINALYSIS, ROUTINE W REFLEX MICROSCOPIC
Bilirubin Urine: NEGATIVE
Glucose, UA: NEGATIVE mg/dL
Ketones, ur: NEGATIVE mg/dL
Leukocytes,Ua: NEGATIVE
Nitrite: NEGATIVE
Protein, ur: 100 mg/dL — AB
Specific Gravity, Urine: >=1.03 (ref 1.005–1.030)
pH: 5.5 (ref 5.0–8.0)

## 2023-08-04 LAB — LIPASE, BLOOD: Lipase: 25 U/L (ref 11–51)

## 2023-08-04 LAB — URINALYSIS, MICROSCOPIC (REFLEX): RBC / HPF: >50 RBC/hpf (ref 0–5)

## 2023-08-04 MED ORDER — LACTATED RINGERS IV BOLUS
1000.0000 mL | Freq: Once | INTRAVENOUS | Status: AC
  Administered 2023-08-04: 1000 mL via INTRAVENOUS

## 2023-08-04 MED ORDER — IOHEXOL 300 MG/ML  SOLN
100.0000 mL | Freq: Once | INTRAMUSCULAR | Status: AC | PRN
  Administered 2023-08-04: 100 mL via INTRAVENOUS

## 2023-08-04 MED ORDER — MORPHINE SULFATE (PF) 4 MG/ML IV SOLN
4.0000 mg | Freq: Once | INTRAVENOUS | Status: AC
  Administered 2023-08-04: 4 mg via INTRAVENOUS
  Filled 2023-08-04: qty 1

## 2023-08-04 MED ORDER — HYDROCODONE-ACETAMINOPHEN 5-325 MG PO TABS: 1.0000 | ORAL_TABLET | Freq: Four times a day (QID) | ORAL | 0 refills | Status: DC | PRN

## 2023-08-04 NOTE — ED Triage Notes (Signed)
Pt reports sudden onset LLQ pain that radiates to LT flank since 1130 today; sts he has been unable to urinate since 0800; also sts he worked in outside cutting down a tree yesterday for about 7 hours

## 2023-08-04 NOTE — ED Provider Notes (Signed)
[facility] Provider Note   CSN: [id number] Arrival date & time: 08/04/23  1308     History  Chief Complaint  Patient presents with   Urinary Retention    Christopher Haynes is a 77 y.o. male.  HPI 77 year old male history of hyperlipidemia, BPH presenting for abdominal pain.  Patient states history he was working outside.  He was doing okay.  However today woke up with pain in his left lower quadrant and groin.  It now radiates to his left flank.  He has not any nausea or vomiting but has not eaten much.  No testicular pain or swelling.  No history of hernias.  He has no history of kidney stones.  No chest pain or shortness of breath or fevers.  Maybe some difficulty urinating but no hematuria or dysuria.     Home Medications Prior to Admission medications   Medication Sig Start Date End Date Taking? Authorizing Provider  amoxicillin (AMOXIL) 500 MG capsule Take 1 capsule (500 mg total) by mouth 3 (three) times daily for 7 days. 12/20/22     Calcium Carbonate (CALCIUM 500 PO) Take by mouth.    [provider]  cetirizine (ZYRTEC) 10 MG tablet Take 10 mg by mouth daily.    [provider]  Cholecalciferol 50 MCG (2000 UT) TABS Take 1 tablet by mouth daily. 10/21/20   [provider]  cyclobenzaprine (FLEXERIL) 10 MG tablet TAKE ONE TABLET BY MOUTH AT BEDTIME AS NEEDED FOR MUSCLE SPASMS 09/22/21   [provider]  diazepam (VALIUM) 10 MG tablet Take 1 tablet (10 mg total) by mouth daily as needed. 05/30/23 11/26/23  Bradd Canary, MD  diclofenac sodium (VOLTAREN) 1 % GEL Apply 2 g topically 4 (four) times daily. 02/13/18   Bradd Canary, MD  EPINEPHrine 0.3 mg/0.3 mL IJ SOAJ injection INJECT 0.3 ML 0.3MG /0.3ML INTRAMUSCULARLY ONCE AS NEEDED USE FOR INSTANCES OF THROAT SWELLING AND DIFFICULTY BREATHING--THEN GO TO  ER USE FOR INSTANCES OF THROAT SWELLING AND DIFFICULTY BREATHING--THEN GO TO   ER 07/13/20    [provider]  famotidine (PEPCID) 20 MG tablet Take 1 tablet (20 mg total) by mouth 2 (two) times daily. 04/28/20   Bradd Canary, MD  finasteride (PROSCAR) 5 MG tablet Take 1 tablet (5 mg total) by mouth daily. 07/03/22     ibuprofen (ADVIL) 800 MG tablet Take 1 tablet (800 mg total) by mouth every 6 (six) hours for pain. 12/20/22     omega-3 fish oil (MAXEPA) 1000 MG CAPS capsule Take 2 capsules by mouth daily. 10/21/20   [provider]  sildenafil (VIAGRA) 100 MG tablet Take 1 tablet (100 mg total) by mouth 60 minutes prior to intercourse. 07/03/22     tamsulosin (FLOMAX) 0.4 MG CAPS capsule Take 0.8 mg by mouth daily.     [provider]  traMADol (ULTRAM) 50 MG tablet Take 1 tablet (50 mg total) by mouth daily as needed. 09/19/17   Donato Schultz, DO      Allergies    Sumatriptan and Tolmetin    Review of Systems   Review of Systems Review of systems completed and notable as per HPI.  ROS otherwise negative.   Physical Exam Updated Vital Signs BP (!) 159/91   Pulse 87   Temp 98.1 F (36.7 C) (Oral)   Resp 20   Ht 5\' 5"  (1.651 m)   Wt 81.6 kg   SpO2 96%  BMI 29.95 kg/m  Physical Exam Vitals and nursing note reviewed. Exam conducted with a chaperone present.  Constitutional:      General: He is not in acute distress.    Appearance: He is well-developed.  HENT:     Head: Normocephalic and atraumatic.     Mouth/Throat:     Mouth: Mucous membranes are moist.     Pharynx: Oropharynx is clear.  Eyes:     Extraocular Movements: Extraocular movements intact.     Conjunctiva/sclera: Conjunctivae normal.     Pupils: Pupils are equal, round, and reactive to light.  Cardiovascular:     Rate and Rhythm: Normal rate and regular rhythm.     Heart sounds: No murmur heard. Pulmonary:     Effort: Pulmonary effort is normal. No respiratory distress.     Breath sounds: Normal breath sounds.  Abdominal:     Palpations: Abdomen is soft.      Tenderness: There is abdominal tenderness. There is no right CVA tenderness, left CVA tenderness, guarding or rebound.     Comments: Mild left hemiabdominal tenderness and left lower quadrant tenderness.  Genitourinary:    Penis: Normal.      Testes: Normal.     Comments: No testicular pain or swelling.  No scrotal skin changes.  No inguinal hernia bilaterally. Musculoskeletal:        General: No swelling.     Cervical back: Neck supple.  Skin:    General: Skin is warm and dry.     Capillary Refill: Capillary refill takes less than 2 seconds.  Neurological:     General: No focal deficit present.     Mental Status: He is alert and oriented to person, place, and time.  Psychiatric:        Mood and Affect: Mood normal.     ED Results / Procedures / Treatments   Labs (all labs ordered are listed, but only abnormal results are displayed) Labs Reviewed  COMPREHENSIVE METABOLIC PANEL - Abnormal; Notable for the following components:      Result Value   Glucose, Bld 130 (*)    Creatinine, Ser 1.40 (*)    GFR, Estimated 52 (*)    All other components within normal limits  URINALYSIS, ROUTINE W REFLEX MICROSCOPIC - Abnormal; Notable for the following components:   Color, Urine AMBER (*)    APPearance CLOUDY (*)    Hgb urine dipstick LARGE (*)    Protein, ur 100 (*)    All other components within normal limits  URINALYSIS, MICROSCOPIC (REFLEX) - Abnormal; Notable for the following components:   Bacteria, UA FEW (*)    All other components within normal limits  CBC WITH DIFFERENTIAL/PLATELET  LIPASE, BLOOD    EKG None  Radiology No results found.  Procedures Procedures    Medications Ordered in ED Medications  iohexol (OMNIPAQUE) 300 MG/ML solution 100 mL (has no administration in time range)  lactated ringers bolus 1,000 mL (0 mLs Intravenous Stopped 08/04/23 1452)  morphine (PF) 4 MG/ML injection 4 mg (4 mg Intravenous Given 08/04/23 1356)    ED Course/ Medical  Decision Making/ A&P                                 Medical Decision Making Amount and/or Complexity of Data Reviewed Labs: ordered. Radiology: ordered.  Risk Prescription drug management.   Medical Decision Making:   Christopher Haynes is a 77 y.o. male  who presented to the ED today with left lower quadrant pain rating to the left flank.  Vital signs reviewed.  Exam he is well-appearing, he has mild tenderness to left lower quadrant.  He has no testicular pain or swelling or hernia, low concern for torsion.  I am concerned for possible kidney stone, diverticulitis, pancreatitis.  No right-sided tenderness, less consistent with appendicitis, cholecystitis.  Plan to obtain CT scan, lab workup.   Patient placed on continuous vitals and telemetry monitoring while in ED which was reviewed periodically.  Reviewed and confirmed nursing documentation for past medical history, family history, social history.  Initial Study Results:   Laboratory  All laboratory results reviewed.  Labs notable for creatinine 1.4, normal electrolytes.  Urinalysis with hematuria, no clear signs of infection.  CBC unremarkable.   Reassessment and Plan:   Patient remained stable.  Handoff given to Dr. Criss Alvine at 3:30 PM with plan to follow-up CT scan and reevaluate.   Patient's presentation is most consistent with acute complicated illness / injury requiring diagnostic workup.          Final Clinical Impression(s) / ED Diagnoses Final diagnoses:  None    Rx / DC Orders ED Discharge Orders     None         Laurence Spates, MD 08/04/23 1531

## 2023-08-04 NOTE — ED Notes (Signed)
ED Provider at bedside. 

## 2023-08-04 NOTE — ED Provider Notes (Signed)
Care transferred to me.  CT shows left ureteral stone.  This was expected based on presentation.  I personally viewed/interpreted these images.  Otherwise his pain is currently controlled.  Mild bump in creatinine but was given some fluids.  He feels well enough for discharge with outpatient pain control.  He would like to follow-up with Dr. Pete Glatter if he needs any urologic treatment.  He otherwise knows about the enlarged prostate and has been following at [facility].  Will give return precautions but otherwise appears stable for discharge home.  No signs or symptoms of infection.   Pricilla Loveless, MD 08/04/23 [phone]

## 2023-08-04 NOTE — Discharge Instructions (Addendum)
Your CT scan shows you have a left-sided stone in your ureter.  This came from your kidney.  You have another 1 that is not causing problems in your right kidney.  Follow-up with Dr. Pete Glatter if you feel like the kidney stone is not passing.  Otherwise, you may take over-the-counter meds such as ibuprofen or Tylenol to help.  Be sure to drink extra fluids.  If these are not helping you may take the hydrocodone.  Have caution and do not combine this with other meds such as Valium or with alcohol.  Do not drive or.  Heavy machinery while on this.  If you develop fever, new or worsening or uncontrolled pain, vomiting, or any other new/concerning symptoms then return to the ER or call 911.

## 2023-08-06 ENCOUNTER — Other Ambulatory Visit: Payer: Self-pay | Admitting: Family Medicine

## 2023-08-07 MED ORDER — DIAZEPAM 10 MG PO TABS
10.0000 mg | ORAL_TABLET | Freq: Every day | ORAL | 1 refills | Status: DC | PRN
  Filled 2023-08-07: qty 30, 30d supply, fill #0
  Filled 2023-09-10: qty 30, 30d supply, fill #1

## 2023-08-08 ENCOUNTER — Other Ambulatory Visit (HOSPITAL_BASED_OUTPATIENT_CLINIC_OR_DEPARTMENT_OTHER): Payer: Self-pay

## 2023-08-14 ENCOUNTER — Ambulatory Visit (INDEPENDENT_AMBULATORY_CARE_PROVIDER_SITE_OTHER): Payer: Medicare Other | Admitting: *Deleted

## 2023-08-14 DIAGNOSIS — Z0000 Encounter for general adult medical examination without abnormal findings: Secondary | ICD-10-CM | POA: Diagnosis not present

## 2023-08-14 NOTE — Patient Instructions (Signed)
Mr. Christopher Haynes , Thank you for taking time to come for your Medicare Wellness Visit. I appreciate your ongoing commitment to your health goals. Please review the following plan we discussed and let me know if I can assist you in the future.     This is a list of the screening recommended for you and due dates:  Health Maintenance  Topic Date Due   Colon Cancer Screening  12/24/2018   Pneumonia Vaccine (2 of 2 - PPSV23 or PCV20) 10/19/2022   COVID-19 Vaccine (7 - 2023-24 season) 12/13/2022   Flu Shot  07/25/2023   Medicare Annual Wellness Visit  08/13/2024   DTaP/Tdap/Td vaccine (4 - Td or Tdap) 10/22/2032   Hepatitis C Screening  Completed   Zoster (Shingles) Vaccine  Completed   HPV Vaccine  Aged Out     Next appointment: Follow up in one year for your annual wellness visit.   Preventive Care 14 Years and Older, Male Preventive care refers to lifestyle choices and visits with your health care provider that can promote health and wellness. What does preventive care include? A yearly physical exam. This is also called an annual well check. Dental exams once or twice a year. Routine eye exams. Ask your health care provider how often you should have your eyes checked. Personal lifestyle choices, including: Daily care of your teeth and gums. Regular physical activity. Eating a healthy diet. Avoiding tobacco and drug use. Limiting alcohol use. Practicing safe sex. Taking low doses of aspirin every day. Taking vitamin and mineral supplements as recommended by your health care provider. What happens during an annual well check? The services and screenings done by your health care provider during your annual well check will depend on your age, overall health, lifestyle risk factors, and family history of disease. Counseling  Your health care provider may ask you questions about your: Alcohol use. Tobacco use. Drug use. Emotional well-being. Home and relationship well-being. Sexual  activity. Eating habits. History of falls. Memory and ability to understand (cognition). Work and work Astronomer. Screening  You may have the following tests or measurements: Height, weight, and BMI. Blood pressure. Lipid and cholesterol levels. These may be checked every 5 years, or more frequently if you are over 52 years old. Skin check. Lung cancer screening. You may have this screening every year starting at age 74 if you have a 30-pack-year history of smoking and currently smoke or have quit within the past 15 years. Fecal occult blood test (FOBT) of the stool. You may have this test every year starting at age 69. Flexible sigmoidoscopy or colonoscopy. You may have a sigmoidoscopy every 5 years or a colonoscopy every 10 years starting at age 85. Prostate cancer screening. Recommendations will vary depending on your family history and other risks. Hepatitis C blood test. Hepatitis B blood test. Sexually transmitted disease (STD) testing. Diabetes screening. This is done by checking your blood sugar (glucose) after you have not eaten for a while (fasting). You may have this done every 1-3 years. Abdominal aortic aneurysm (AAA) screening. You may need this if you are a current or former smoker. Osteoporosis. You may be screened starting at age 94 if you are at high risk. Talk with your health care provider about your test results, treatment options, and if necessary, the need for more tests. Vaccines  Your health care provider may recommend certain vaccines, such as: Influenza vaccine. This is recommended every year. Tetanus, diphtheria, and acellular pertussis (Tdap, Td) vaccine. You may  need a Td booster every 10 years. Zoster vaccine. You may need this after age 19. Pneumococcal 13-valent conjugate (PCV13) vaccine. One dose is recommended after age 68. Pneumococcal polysaccharide (PPSV23) vaccine. One dose is recommended after age 80. Talk to your health care provider about which  screenings and vaccines you need and how often you need them. This information is not intended to replace advice given to you by your health care provider. Make sure you discuss any questions you have with your health care provider. Document Released: 01/06/2016 Document Revised: 08/29/2016 Document Reviewed: 10/11/2015 Elsevier Interactive Patient Education  2017 [employer].  Fall Prevention in the Home Falls can cause injuries. They can happen to people of all ages. There are many things you can do to make your home safe and to help prevent falls. What can I do on the outside of my home? Regularly fix the edges of walkways and driveways and fix any cracks. Remove anything that might make you trip as you walk through a door, such as a raised step or threshold. Trim any bushes or trees on the path to your home. Use bright outdoor lighting. Clear any walking paths of anything that might make someone trip, such as rocks or tools. Regularly check to see if handrails are loose or broken. Make sure that both sides of any steps have handrails. Any raised decks and porches should have guardrails on the edges. Have any leaves, snow, or ice cleared regularly. Use sand or salt on walking paths during winter. Clean up any spills in your garage right away. This includes oil or grease spills. What can I do in the bathroom? Use night lights. Install grab bars by the toilet and in the tub and shower. Do not use towel bars as grab bars. Use non-skid mats or decals in the tub or shower. If you need to sit down in the shower, use a plastic, non-slip stool. Keep the floor dry. Clean up any water that spills on the floor as soon as it happens. Remove soap buildup in the tub or shower regularly. Attach bath mats securely with double-sided non-slip rug tape. Do not have throw rugs and other things on the floor that can make you trip. What can I do in the bedroom? Use night lights. Make sure that you have a  light by your bed that is easy to reach. Do not use any sheets or blankets that are too big for your bed. They should not hang down onto the floor. Have a firm chair that has side arms. You can use this for support while you get dressed. Do not have throw rugs and other things on the floor that can make you trip. What can I do in the kitchen? Clean up any spills right away. Avoid walking on wet floors. Keep items that you use a lot in easy-to-reach places. If you need to reach something above you, use a strong step stool that has a grab bar. Keep electrical cords out of the way. Do not use floor polish or wax that makes floors slippery. If you must use wax, use non-skid floor wax. Do not have throw rugs and other things on the floor that can make you trip. What can I do with my stairs? Do not leave any items on the stairs. Make sure that there are handrails on both sides of the stairs and use them. Fix handrails that are broken or loose. Make sure that handrails are as long as the stairways.  Check any carpeting to make sure that it is firmly attached to the stairs. Fix any carpet that is loose or worn. Avoid having throw rugs at the top or bottom of the stairs. If you do have throw rugs, attach them to the floor with carpet tape. Make sure that you have a light switch at the top of the stairs and the bottom of the stairs. If you do not have them, ask someone to add them for you. What else can I do to help prevent falls? Wear shoes that: Do not have high heels. Have rubber bottoms. Are comfortable and fit you well. Are closed at the toe. Do not wear sandals. If you use a stepladder: Make sure that it is fully opened. Do not climb a closed stepladder. Make sure that both sides of the stepladder are locked into place. Ask someone to hold it for you, if possible. Clearly mark and make sure that you can see: Any grab bars or handrails. First and last steps. Where the edge of each step  is. Use tools that help you move around (mobility aids) if they are needed. These include: Canes. Walkers. Scooters. Crutches. Turn on the lights when you go into a dark area. Replace any light bulbs as soon as they burn out. Set up your furniture so you have a clear path. Avoid moving your furniture around. If any of your floors are uneven, fix them. If there are any pets around you, be aware of where they are. Review your medicines with your doctor. Some medicines can make you feel dizzy. This can increase your chance of falling. Ask your doctor what other things that you can do to help prevent falls. This information is not intended to replace advice given to you by your health care provider. Make sure you discuss any questions you have with your health care provider. Document Released: 10/06/2009 Document Revised: 05/17/2016 Document Reviewed: 01/14/2015 Elsevier Interactive Patient Education  2017 [employer].

## 2023-08-14 NOTE — Progress Notes (Signed)
Subjective:   Christopher Haynes is a 77 y.o. male who presents for Medicare Annual/Subsequent preventive examination.  Visit Complete: Virtual  I connected with  Lurline Idol on 08/14/23 by a audio enabled telemedicine application and verified that I am speaking with the correct person using two identifiers.  Patient Location: Home  Provider Location: Office/Clinic  I discussed the limitations of evaluation and management by telemedicine. The patient expressed understanding and agreed to proceed.  Patient Medicare AWV questionnaire was completed by the patient on 08/07/23; I have confirmed that all information answered by patient is correct and no changes since this date.  Review of Systems     Cardiac Risk Factors include: advanced age (>76men, >59 women);male gender;dyslipidemia     Objective:    Vital Signs: Unable to obtain new vitals due to this being a telehealth visit.      08/14/2023    1:45 PM 08/04/2023    1:17 PM 07/31/2022    2:32 PM 05/04/2020   10:02 AM 12/13/2017   11:12 AM 12/06/2017   11:24 AM 02/11/2017    6:31 PM  Advanced Directives  Does Patient Have a Medical Advance Directive? Yes No Yes Yes No No No  Type of Advance Directive Living will;Healthcare Power of [employer] Power of [employer] Power of [place];Living will     Does patient want to make changes to medical advance directive? No - Patient declined   No - Patient declined     Copy of Healthcare Power of Attorney in Chart? Yes - validated most recent copy scanned in chart (See row information)  No - copy requested No - copy requested     Would patient like information on creating a medical advance directive?     No - Patient declined  Yes (MAU/Ambulatory/Procedural Areas - Information given)    Current Medications (verified) Outpatient Encounter Medications as of 08/14/2023  Medication Sig   Calcium Carbonate (CALCIUM 500 PO) Take by mouth.   cetirizine (ZYRTEC) 10 MG tablet Take 10  mg by mouth daily.   Cholecalciferol 50 MCG (2000 UT) TABS Take 1 tablet by mouth daily.   cyclobenzaprine (FLEXERIL) 10 MG tablet TAKE ONE TABLET BY MOUTH AT BEDTIME AS NEEDED FOR MUSCLE SPASMS   diazepam (VALIUM) 10 MG tablet Take 1 tablet (10 mg total) by mouth daily as needed.   diclofenac sodium (VOLTAREN) 1 % GEL Apply 2 g topically 4 (four) times daily.   EPINEPHrine 0.3 mg/0.3 mL IJ SOAJ injection INJECT 0.3 ML 0.3MG /0.3ML INTRAMUSCULARLY ONCE AS NEEDED USE FOR INSTANCES OF THROAT SWELLING AND DIFFICULTY BREATHING--THEN GO TO  ER USE FOR INSTANCES OF THROAT SWELLING AND DIFFICULTY BREATHING--THEN GO TO   ER   famotidine (PEPCID) 20 MG tablet Take 1 tablet (20 mg total) by mouth 2 (two) times daily.   finasteride (PROSCAR) 5 MG tablet Take 1 tablet (5 mg total) by mouth daily.   HYDROcodone-acetaminophen (NORCO) 5-325 MG tablet Take 1 tablet by mouth every 6 (six) hours as needed for severe pain.   ibuprofen (ADVIL) 800 MG tablet Take 1 tablet (800 mg total) by mouth every 6 (six) hours for pain.   sildenafil (VIAGRA) 100 MG tablet Take 1 tablet (100 mg total) by mouth 60 minutes prior to intercourse.   tamsulosin (FLOMAX) 0.4 MG CAPS capsule Take 0.8 mg by mouth daily.    traMADol (ULTRAM) 50 MG tablet Take 1 tablet (50 mg total) by mouth daily as needed.   [DISCONTINUED] amoxicillin (AMOXIL) 500  MG capsule Take 1 capsule (500 mg total) by mouth 3 (three) times daily for 7 days.   [DISCONTINUED] omega-3 fish oil (MAXEPA) 1000 MG CAPS capsule Take 2 capsules by mouth daily.   No facility-administered encounter medications on file as of 08/14/2023.    Allergies (verified) Sumatriptan and Tolmetin   History: Past Medical History:  Diagnosis Date   Anxiety 08/05/2017   Arthritis ?   Back pain 08/20/2016   Benign prostatic hyperplasia (BPH) with urinary urgency 02/09/2016   Cataract 2024   Constipation 02/20/2016   Contusion 02/20/2016   Depression    Elevated PSA    H/O measles     Headache    History of colonic polyps 02/09/2016   [facility]. 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   Past Surgical History:  Procedure Laterality Date   ANKLE FRACTURE SURGERY  66   KNEE ARTHROSCOPY WITH SUBCHONDROPLASTY Left 12/13/2017   Procedure: KNEE ARTHROSCOPY WITH SUBCHONDROPLASTY;  Surgeon: Marcene Corning, MD;  Location: [facility];  Service: Orthopedics;  Laterality: Left;   SKIN GRAFT  Age 78   TONSILLECTOMY  Age 60   Family History  Problem Relation Age of Onset   Hypertension Mother    Diabetes Mother    Colon cancer Father    Cancer Father        rectal with mets   Hypertension Brother    Obesity Son    Other Son        fatty liver   Allergic Disorder Son        seasonal   Stroke Paternal Grandfather    Social History   Socioeconomic History   Marital status: Married    Spouse name: Not on file   Number of children: Not on file   Years of education: Not on file   Highest education level: Not on file  Occupational History   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  Tobacco Use   Smoking status: Never   Smokeless tobacco: Never  Substance and Sexual Activity   Alcohol use: Yes    Alcohol/week: 4.0 standard drinks of alcohol    Types: 4 Cans of beer per week   Drug use: No   Sexual activity: Yes    Comment: lives with wife, work in Audiological scientist estate, no dietary restrictions  Other Topics Concern   Not on file  Social History Narrative   Not on file   Social Determinants of Health   Financial Resource Strain: Low Risk  (08/07/2023)   Overall Financial Resource Strain (CARDIA)     Difficulty of Paying Living Expenses: Not hard at all  Food Insecurity: No Food Insecurity (08/07/2023)   Hunger Vital Sign    Worried About Running Out of Food in the Last Year: Never true    Ran Out of Food in the Last Year: Never true  Transportation Needs: No Transportation Needs (08/07/2023)   PRAPARE - Administrator, Civil Service (Medical): No    Lack of Transportation (Non-Medical): No  Physical Activity: Sufficiently Active (08/07/2023)  Exercise Vital Sign    Days of Exercise per Week: 5 days    Minutes of Exercise per Session: 60 min  Stress: Stress Concern Present (08/07/2023)   [employer] of Occupational Health - Occupational Stress Questionnaire    Feeling of Stress : To some extent  Social Connections: Unknown (08/07/2023)   Social Connection and Isolation Panel [NHANES]    Frequency of Communication with Friends and Family: Twice a week    Frequency of Social Gatherings with Friends and Family: Once a week    Attends Religious Services: Not on Marketing executive or Organizations: No    Attends Engineer, structural: 1 to 4 times per year    Marital Status: Married    Tobacco Counseling Counseling given: Not Answered   Clinical Intake:  Pre-visit preparation completed: Yes  Pain : No/denies pain  Nutritional Risks: None Diabetes: No  How often do you need to have someone help you when you read instructions, pamphlets, or other written materials from your doctor or pharmacy?: 1 - Never  Interpreter Needed?: No  Information entered by :: [employer], CMA   Activities of Daily Living    08/07/2023    5:27 PM  In your present state of health, do you have any difficulty performing the following activities:  Hearing? 1  Comment wears hearing aids  Vision? 0  Difficulty concentrating or making decisions? 0  Walking or climbing stairs? 0  Dressing or bathing? 0  Doing errands, shopping? 0  Preparing Food and  eating ? N  Using the Toilet? N  In the past six months, have you accidently leaked urine? N  Do you have problems with loss of bowel control? N  Managing your Medications? N  Managing your Finances? N  Housekeeping or managing your Housekeeping? N    Patient Care Team: Bradd Canary, MD as PCP - General (Family Medicine) Pete Glatter, Danford Bad., MD as Referring Physician (Urology) Mckinley Jewel, MD as Consulting Physician (Ophthalmology) Floyde Parkins, MD (Unknown Physician Specialty) Cleta Alberts, MD as Referring Physician (Internal Medicine) [facility], [facility]  Indicate any recent Medical Services you may have received from other than [facility] providers in the past year (date may be approximate).     Assessment:   This is a routine wellness examination for Lee.  Hearing/Vision screen No results found.  Dietary issues and exercise activities discussed:     Goals Addressed   None    Depression Screen    08/14/2023    1:51 PM 10/22/2022    9:09 AM 07/31/2022    2:29 PM 02/23/2021    1:57 PM 08/25/2020   10:08 AM 02/24/2017    9:57 PM 08/10/2016    9:09 AM  PHQ 2/9 Scores  PHQ - 2 Score 0 0 1 0 1 0 0  PHQ- 9 Score  2   1      Fall Risk    08/07/2023    5:27 PM 10/22/2022    9:08 AM 07/31/2022    2:33 PM 02/23/2021    1:57 PM 02/22/2020    8:45 AM  Fall Risk   Falls in the past year? 0 0 0 0 0  Number falls in past yr: 0 0 0 0 0  Injury with Fall? 0 0 0 0 0  Risk for fall due to : No Fall Risks  Impaired vision    Follow up Falls evaluation completed Falls evaluation completed Falls prevention discussed  MEDICARE RISK AT HOME: Medicare Risk at Home Any stairs in or around the home?: Yes If so, are there any without handrails?: No Home free of loose throw rugs in walkways, pet beds, electrical cords, etc?: Yes Adequate lighting in your home to reduce risk of falls?: Yes Life alert?: No Use of a cane, walker or w/c?: No Grab bars in the bathroom?:  No Shower chair or bench in shower?: No Elevated toilet seat or a handicapped toilet?: No  TIMED UP AND GO:  Was the test performed?  No    Cognitive Function:        08/14/2023    1:52 PM 07/31/2022    2:35 PM  6CIT Screen  What Year? 0 points 0 points  What month? 0 points 0 points  What time? 0 points 0 points  Count back from 20 0 points 0 points  Months in reverse 0 points 0 points  Repeat phrase 0 points 2 points  Total Score 0 points 2 points    Immunizations Immunization History  Administered Date(s) Administered   Fluad Quad(high Dose 65+) 08/24/2019, 09/16/2020, 09/15/2021, 09/14/2022   Influenza, High Dose Seasonal PF 09/24/2016   Influenza,inj,Quad PF,6+ Mos 10/21/2017   Influenza-Unspecified 02/15/2005, 10/11/2006, 10/09/2007, 09/28/2008, 11/30/2009, 09/20/2010, 09/28/2011, 09/18/2012, 11/04/2013, 10/19/2014, 09/24/2015, 11/09/2015, 09/23/2018   PFIZER Comirnaty(Gray Top)Covid-19 Tri-Sucrose Vaccine 05/05/2021   PFIZER(Purple Top)SARS-COV-2 Vaccination 01/15/2020, 02/05/2020, 10/23/2020   Pfizer Covid-19 Vaccine Bivalent Booster 18yrs & up 09/15/2021, 10/18/2022   Pneumococcal Conjugate-13 05/25/2014, 01/29/2018, 10/19/2021   Pneumococcal-Unspecified 12/24/2009, 05/14/2011   Tdap 12/25/2011, 03/24/2012, 10/22/2022   Zoster Recombinant(Shingrix) 04/08/2018, 07/08/2018    TDAP status: Up to date  Flu Vaccine status: Due, Education has been provided regarding the importance of this vaccine. Advised may receive this vaccine at local pharmacy or Health Dept. Aware to provide a copy of the vaccination record if obtained from local pharmacy or Health Dept. Verbalized acceptance and understanding.  Pneumococcal vaccine status: Due, Education has been provided regarding the importance of this vaccine. Advised may receive this vaccine at local pharmacy or Health Dept. Aware to provide a copy of the vaccination record if obtained from local pharmacy or Health Dept.  Verbalized acceptance and understanding.  Covid-19 vaccine status: Information provided on how to obtain vaccines.   Qualifies for Shingles Vaccine? Yes   Zostavax completed No   Shingrix Completed?: Yes  Screening Tests Health Maintenance  Topic Date Due   Colonoscopy  12/24/2018   Pneumonia Vaccine 49+ Years old (2 of 2 - PPSV23 or PCV20) 10/19/2022   COVID-19 Vaccine (7 - 2023-24 season) 12/13/2022   Medicare Annual Wellness (AWV)  08/01/2023   INFLUENZA VACCINE  07/25/2023   DTaP/Tdap/Td (4 - Td or Tdap) 10/22/2032   Hepatitis C Screening  Completed   Zoster Vaccines- Shingrix  Completed   HPV VACCINES  Aged Out    Health Maintenance  Health Maintenance Due  Topic Date Due   Colonoscopy  12/24/2018   Pneumonia Vaccine 74+ Years old (2 of 2 - PPSV23 or PCV20) 10/19/2022   COVID-19 Vaccine (7 - 2023-24 season) 12/13/2022   Medicare Annual Wellness (AWV)  08/01/2023   INFLUENZA VACCINE  07/25/2023    Colorectal cancer screening: Type of screening: Colonoscopy. Completed 07/09/2023. Repeat every N/a years  Lung Cancer Screening: (Low Dose CT Chest recommended if Age 77-80 years, 20 pack-year currently smoking OR have quit w/in 15years.) does not qualify.   Additional Screening:  Hepatitis C Screening: does qualify; Completed 04/15/17  Vision Screening:  Recommended annual ophthalmology exams for early detection of glaucoma and other disorders of the eye. Is the patient up to date with their annual eye exam?  Yes  Who is the provider or what is the name of the office in which the patient attends annual eye exams? [facility] If pt is not established with a provider, would they like to be referred to a provider to establish care? No .   Dental Screening: Recommended annual dental exams for proper oral hygiene  Diabetic Foot Exam: N/a  Community Resource Referral / Chronic Care Management: CRR required this visit?  No   CCM required this visit?  No     Plan:      I have personally reviewed and noted the following in the patient's chart:   Medical and social history Use of alcohol, tobacco or illicit drugs  Current medications and supplements including opioid prescriptions. Patient is not currently taking opioid prescriptions. Functional ability and status Nutritional status Physical activity Advanced directives List of other physicians Hospitalizations, surgeries, and ER visits in previous 12 months Vitals Screenings to include cognitive, depression, and falls Referrals and appointments  In addition, I have reviewed and discussed with patient certain preventive protocols, quality metrics, and best practice recommendations. A written personalized care plan for preventive services as well as general preventive health recommendations were provided to patient.     Donne Anon, CMA   08/14/2023   After Visit Summary: (MyChart) Due to this being a telephonic visit, the after visit summary with patients personalized plan was offered to patient via MyChart   Nurse Notes: None

## 2023-10-04 ENCOUNTER — Other Ambulatory Visit: Payer: Self-pay | Admitting: Family Medicine

## 2023-10-04 NOTE — Telephone Encounter (Signed)
Requesting: diazepam 10mg   Contract:10/22/22 UDS:10/22/22 Last Visit:  10/23/2022 Next Visit: 10/24/23 Last Refill: 08/07/23 #30 and 0RF   Please Advise

## 2023-10-05 MED ORDER — DIAZEPAM 10 MG PO TABS
10.0000 mg | ORAL_TABLET | Freq: Every day | ORAL | 1 refills | Status: DC | PRN
  Filled 2023-10-05 – 2023-10-11 (×2): qty 30, 30d supply, fill #0
  Filled 2023-11-13: qty 30, 30d supply, fill #1

## 2023-10-07 ENCOUNTER — Other Ambulatory Visit (HOSPITAL_BASED_OUTPATIENT_CLINIC_OR_DEPARTMENT_OTHER): Payer: Self-pay

## 2023-10-11 ENCOUNTER — Other Ambulatory Visit (HOSPITAL_BASED_OUTPATIENT_CLINIC_OR_DEPARTMENT_OTHER): Payer: Self-pay

## 2023-10-23 NOTE — Assessment & Plan Note (Signed)
hgba1c acceptable, minimize simple carbs. Increase exercise as tolerated.  

## 2023-10-23 NOTE — Assessment & Plan Note (Addendum)
Patient encouraged to maintain heart healthy diet, regular exercise, adequate sleep. Consider daily probiotics. Take medications as prescribed. Labs ordered and reviewed Colonoscopy was roughly 9 years ago per patient Last Tetanus 2013, repeat every 10 years at [place] RSV (respiratory syncitial virus) vaccine at pharmacy, Arexvy High dose flu shot done at [facility]

## 2023-10-23 NOTE — Assessment & Plan Note (Signed)
Supplement and monitor 

## 2023-10-23 NOTE — Assessment & Plan Note (Signed)
Encourage heart healthy diet such as MIND or DASH diet, increase exercise, avoid trans fats, simple carbohydrates and processed foods, consider a krill or fish or flaxseed oil cap daily.  °

## 2023-10-23 NOTE — Assessment & Plan Note (Signed)
Encouraged DASH or MIND diet, decrease po intake and increase exercise as tolerated. Needs 7-8 hours of sleep nightly. Avoid trans fats, eat small, frequent meals every 4-5 hours with lean proteins, complex carbs and healthy fats. Minimize simple carbs, high fat foods and processed foods 

## 2023-10-24 ENCOUNTER — Encounter: Payer: Self-pay | Admitting: *Deleted

## 2023-10-24 ENCOUNTER — Encounter: Payer: Self-pay | Admitting: Family Medicine

## 2023-10-24 ENCOUNTER — Ambulatory Visit: Payer: Medicare Other | Admitting: Family Medicine

## 2023-10-24 VITALS — BP 126/78 | HR 75 | Temp 98.0°F | Resp 18 | Ht 65.0 in | Wt 186.2 lb

## 2023-10-24 DIAGNOSIS — E782 Mixed hyperlipidemia: Secondary | ICD-10-CM

## 2023-10-24 DIAGNOSIS — Z0000 Encounter for general adult medical examination without abnormal findings: Secondary | ICD-10-CM | POA: Diagnosis not present

## 2023-10-24 DIAGNOSIS — R739 Hyperglycemia, unspecified: Secondary | ICD-10-CM

## 2023-10-24 DIAGNOSIS — E669 Obesity, unspecified: Secondary | ICD-10-CM | POA: Diagnosis not present

## 2023-10-24 DIAGNOSIS — E559 Vitamin D deficiency, unspecified: Secondary | ICD-10-CM

## 2023-10-24 DIAGNOSIS — M549 Dorsalgia, unspecified: Secondary | ICD-10-CM

## 2023-10-24 DIAGNOSIS — G8929 Other chronic pain: Secondary | ICD-10-CM

## 2023-10-24 DIAGNOSIS — R972 Elevated prostate specific antigen [PSA]: Secondary | ICD-10-CM

## 2023-10-24 NOTE — Patient Instructions (Signed)
Netflix Live to 100 the Secrets of the Blue Zones  60-80 ounces of fluids daily ideally 10 ounces every 1-2 hours  Tizanidine is the muscle relaxer   Preventive Care 65 Years and Older, Male Preventive care refers to lifestyle choices and visits with your health care provider that can promote health and wellness. Preventive care visits are also called wellness exams. What can I expect for my preventive care visit? Counseling During your preventive care visit, your health care provider may ask about your: Medical history, including: Past medical problems. Family medical history. History of falls. Current health, including: Emotional well-being. Home life and relationship well-being. Sexual activity. Memory and ability to understand (cognition). Lifestyle, including: Alcohol, nicotine or tobacco, and drug use. Access to firearms. Diet, exercise, and sleep habits. Work and work Astronomer. Sunscreen use. Safety issues such as seatbelt and bike helmet use. Physical exam Your health care provider will check your: Height and weight. These may be used to calculate your BMI (body mass index). BMI is a measurement that tells if you are at a healthy weight. Waist circumference. This measures the distance around your waistline. This measurement also tells if you are at a healthy weight and may help predict your risk of certain diseases, such as type 2 diabetes and high blood pressure. Heart rate and blood pressure. Body temperature. Skin for abnormal spots. What immunizations do I need?  Vaccines are usually given at various ages, according to a schedule. Your health care provider will recommend vaccines for you based on your age, medical history, and lifestyle or other factors, such as travel or where you work. What tests do I need? Screening Your health care provider may recommend screening tests for certain conditions. This may include: Lipid and cholesterol levels. Diabetes  screening. This is done by checking your blood sugar (glucose) after you have not eaten for a while (fasting). Hepatitis C test. Hepatitis B test. HIV (human immunodeficiency virus) test. STI (sexually transmitted infection) testing, if you are at risk. Lung cancer screening. Colorectal cancer screening. Prostate cancer screening. Abdominal aortic aneurysm (AAA) screening. You may need this if you are a current or former smoker. Talk with your health care provider about your test results, treatment options, and if necessary, the need for more tests. Follow these instructions at home: Eating and drinking  Eat a diet that includes fresh fruits and vegetables, whole grains, lean protein, and low-fat dairy products. Limit your intake of foods with high amounts of sugar, saturated fats, and salt. Take vitamin and mineral supplements as recommended by your health care provider. Do not drink alcohol if your health care provider tells you not to drink. If you drink alcohol: Limit how much you have to 0-2 drinks a day. Know how much alcohol is in your drink. In the [place] one drink equals one 12 oz bottle of beer (355 mL), one 5 oz glass of wine (148 mL), or one 1 oz glass of hard liquor (44 mL). Lifestyle Brush your teeth every morning and night with fluoride toothpaste. Floss one time each day. Exercise for at least 30 minutes 5 or more days each week. Do not use any products that contain nicotine or tobacco. These products include cigarettes, chewing tobacco, and vaping devices, such as e-cigarettes. If you need help quitting, ask your health care provider. Do not use drugs. If you are sexually active, practice safe sex. Use a condom or other form of protection to prevent STIs. Take aspirin only as told by your  health care provider. Make sure that you understand how much to take and what form to take. Work with your health care provider to find out whether it is safe and beneficial for you to take  aspirin daily. Ask your health care provider if you need to take a cholesterol-lowering medicine (statin). Find healthy ways to manage stress, such as: Meditation, yoga, or listening to music. Journaling. Talking to a trusted person. Spending time with friends and family. Safety Always wear your seat belt while driving or riding in a vehicle. Do not drive: If you have been drinking alcohol. Do not ride with someone who has been drinking. When you are tired or distracted. While texting. If you have been using any mind-altering substances or drugs. Wear a helmet and other protective equipment during sports activities. If you have firearms in your house, make sure you follow all gun safety procedures. Minimize exposure to UV radiation to reduce your risk of skin cancer. What's next? Visit your health care provider once a year for an annual wellness visit. Ask your health care provider how often you should have your eyes and teeth checked. Stay up to date on all vaccines. This information is not intended to replace advice given to you by your health care provider. Make sure you discuss any questions you have with your health care provider. Document Revised: 06/07/2021 Document Reviewed: 06/07/2021 Elsevier Patient Education  2024 [employer].

## 2023-10-24 NOTE — Progress Notes (Signed)
Subjective:    Patient ID: Christopher Haynes, male    DOB: 08/13/1946, 77 y.o.   MRN: [id number]  Chief Complaint  Patient presents with   Annual Exam    HPI Discussed the use of AI scribe software for clinical note transcription with the patient, who gave verbal consent to proceed.  History of Present Illness   The patient, with a history of back problems including crushed vertebrae and compression fractures, presents with back pain and discomfort. The pain is particularly noticeable after periods of inactivity or prolonged sitting. The patient reports that movement tends to alleviate the discomfort. The patient has been referred for physical therapy by the [facility], but sessions have not yet begun.  In addition to the back issues, the patient has a history of prostate issues and kidney stones. The patient is under the care of a urologist for these conditions. The patient reports having passed kidney stones in the past and is currently on finasteride for prostate issues. The patient's PSA levels have decreased from 9 to 4.  The patient also reports having received recent vaccinations, including the RSV vaccine and the flu shot. The patient plans to receive the COVID-19 booster shot in the near future.     Patient receives a good deal of his ongoing health care at the [facility] in [place]   Past Medical History:  Diagnosis Date   Anxiety 08/05/2017   Arthritis ?   Back pain 08/20/2016   Benign prostatic hyperplasia (BPH) with urinary urgency 02/09/2016   Cataract 2024   Constipation 02/20/2016   Contusion 02/20/2016   Depression    Elevated PSA    H/O measles    Headache    History of colonic polyps 02/09/2016   [facility]. Now on 5 year plan   Hyperglycemia 08/20/2016   Hyperlipidemia, mixed 02/08/2017   Macular degeneration of left eye 02/19/2016   Migraine with visual aura 02/09/2016   Neck pain 08/05/2017   OSA on CPAP    Osteoporosis     Pain in joint, shoulder region 02/19/2016   Peripheral neuropathy 08/05/2017   Sleep apnea 2005   Thyroid disease    thyroiditis, h/o in 74s    Past Surgical History:  Procedure Laterality Date   ANKLE FRACTURE SURGERY  66   KNEE ARTHROSCOPY WITH SUBCHONDROPLASTY Left 12/13/2017   Procedure: KNEE ARTHROSCOPY WITH SUBCHONDROPLASTY;  Surgeon: Marcene Corning, MD;  Location: [facility];  Service: Orthopedics;  Laterality: Left;   SKIN GRAFT  Age 47   TONSILLECTOMY  Age 9    Family History  Problem Relation Age of Onset   Hypertension Mother    Diabetes Mother    Colon cancer Father    Cancer Father        rectal with mets   Hypertension Brother    Obesity Son    Other Son        fatty liver   Allergic Disorder Son        seasonal   Stroke Paternal Grandfather     Social History   Socioeconomic History   Marital status: Married    Spouse name: Not on file   Number of children: Not on file   Years of education: Not on file   Highest education level: Not on file  Occupational History   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  Tobacco Use   Smoking status: Never   Smokeless tobacco: Never  Substance and Sexual  Activity   Alcohol use: Yes    Alcohol/week: 4.0 standard drinks of alcohol    Types: 4 Cans of beer per week   Drug use: No   Sexual activity: Yes    Comment: lives with wife, work in Audiological scientist estate, no dietary restrictions  Other Topics Concern   Not on file  Social History Narrative   Not on file   Social Determinants of Health   Financial Resource Strain: Low Risk  (08/07/2023)   Overall Financial Resource Strain (CARDIA)    Difficulty of Paying Living Expenses: Not hard at all  Food Insecurity: No Food Insecurity (08/07/2023)   Hunger Vital Sign    Worried About Running Out of Food in the Last Year: Never true    Ran Out of Food in the Last Year: Never true  Transportation Needs: No Transportation Needs (08/07/2023)   PRAPARE - Therapist, art (Medical): No    Lack of Transportation (Non-Medical): No  Physical Activity: Sufficiently Active (08/07/2023)   Exercise Vital Sign    Days of Exercise per Week: 5 days    Minutes of Exercise per Session: 60 min  Stress: Stress Concern Present (08/07/2023)   [employer] of Occupational Health - Occupational Stress Questionnaire    Feeling of Stress : To some extent  Social Connections: Unknown (08/07/2023)   Social Connection and Isolation Panel [NHANES]    Frequency of Communication with Friends and Family: Twice a week    Frequency of Social Gatherings with Friends and Family: Once a week    Attends Religious Services: Not on Marketing executive or Organizations: No    Attends Banker Meetings: 1 to 4 times per year    Marital Status: Married  Catering manager Violence: Not At Risk (08/14/2023)   Humiliation, Afraid, Rape, and Kick questionnaire    Fear of Current or Ex-Partner: No    Emotionally Abused: No    Physically Abused: No    Sexually Abused: No    Outpatient Medications Prior to Visit  Medication Sig Dispense Refill   Calcium Carbonate (CALCIUM 500 PO) Take by mouth.     cetirizine (ZYRTEC) 10 MG tablet Take 10 mg by mouth daily.     Cholecalciferol 50 MCG (2000 UT) TABS Take 1 tablet by mouth daily.     cyclobenzaprine (FLEXERIL) 10 MG tablet TAKE ONE TABLET BY MOUTH AT BEDTIME AS NEEDED FOR MUSCLE SPASMS     diazepam (VALIUM) 10 MG tablet Take 1 tablet (10 mg total) by mouth daily as needed. 30 tablet 1   diclofenac sodium (VOLTAREN) 1 % GEL Apply 2 g topically 4 (four) times daily.     EPINEPHrine 0.3 mg/0.3 mL IJ SOAJ injection INJECT 0.3 ML 0.3MG /0.3ML INTRAMUSCULARLY ONCE AS NEEDED USE FOR INSTANCES OF THROAT SWELLING AND DIFFICULTY BREATHING--THEN GO TO  ER USE FOR INSTANCES OF THROAT SWELLING AND DIFFICULTY BREATHING--THEN GO TO   ER     famotidine (PEPCID) 20 MG tablet Take 1 tablet (20 mg total) by mouth 2  (two) times daily. 60 tablet    finasteride (PROSCAR) 5 MG tablet Take 1 tablet (5 mg total) by mouth daily. 90 tablet 3   HYDROcodone-acetaminophen (NORCO) 5-325 MG tablet Take 1 tablet by mouth every 6 (six) hours as needed for severe pain. 10 tablet 0   ibuprofen (ADVIL) 800 MG tablet Take 1 tablet (800 mg total) by mouth every 6 (six) hours for pain. 40  tablet 0   sildenafil (VIAGRA) 100 MG tablet Take 1 tablet (100 mg total) by mouth 60 minutes prior to intercourse. 10 tablet 11   tamsulosin (FLOMAX) 0.4 MG CAPS capsule Take 0.8 mg by mouth daily.      traMADol (ULTRAM) 50 MG tablet Take 1 tablet (50 mg total) by mouth daily as needed. 30 tablet 0   No facility-administered medications prior to visit.    Allergies  Allergen Reactions   Sumatriptan Nausea And Vomiting   Tolmetin     Other reaction(s): Other (See Comments)    Review of Systems  Constitutional:  Positive for malaise/fatigue. Negative for fever.  HENT:  Negative for congestion.   Eyes:  Negative for blurred vision.  Respiratory:  Negative for shortness of breath.   Cardiovascular:  Negative for chest pain, palpitations and leg swelling.  Gastrointestinal:  Negative for abdominal pain, blood in stool and nausea.  Genitourinary:  Negative for dysuria and frequency.  Musculoskeletal:  Positive for back pain and joint pain. Negative for falls.  Skin:  Negative for rash.  Neurological:  Negative for dizziness, loss of consciousness and headaches.  Endo/Heme/Allergies:  Negative for environmental allergies.  Psychiatric/Behavioral:  Negative for depression. The patient is not nervous/anxious.        Objective:    Physical Exam Vitals reviewed.  Constitutional:      General: He is not in acute distress.    Appearance: Normal appearance. He is not ill-appearing or diaphoretic.  HENT:     Head: Normocephalic and atraumatic.     Right Ear: Tympanic membrane, ear canal and external ear normal. There is no impacted  cerumen.     Left Ear: Tympanic membrane, ear canal and external ear normal. There is no impacted cerumen.     Nose: Nose normal. No rhinorrhea.     Mouth/Throat:     Pharynx: Oropharynx is clear.  Eyes:     General: No scleral icterus.    Extraocular Movements: Extraocular movements intact.     Conjunctiva/sclera: Conjunctivae normal.     Pupils: Pupils are equal, round, and reactive to light.  Neck:     Thyroid: No thyroid mass or thyroid tenderness.  Cardiovascular:     Rate and Rhythm: Normal rate and regular rhythm.     Pulses: Normal pulses.     Heart sounds: Normal heart sounds. No murmur heard. Pulmonary:     Effort: Pulmonary effort is normal.     Breath sounds: Normal breath sounds. No wheezing.  Abdominal:     General: Bowel sounds are normal.     Palpations: Abdomen is soft. There is no mass.     Tenderness: There is no guarding.  Musculoskeletal:        General: No swelling. Normal range of motion.     Cervical back: Normal range of motion and neck supple. No rigidity.     Right lower leg: No edema.     Left lower leg: No edema.  Lymphadenopathy:     Cervical: No cervical adenopathy.  Skin:    General: Skin is warm and dry.     Findings: No rash.  Neurological:     General: No focal deficit present.     Mental Status: He is alert and oriented to person, place, and time.     Cranial Nerves: No cranial nerve deficit.     Deep Tendon Reflexes: Reflexes normal.  Psychiatric:        Mood and Affect: Mood normal.  Behavior: Behavior normal.     BP 126/78 (BP Location: Right Arm, Patient Position: Sitting, Cuff Size: Normal)   Pulse 75   Temp 98 F (36.7 C) (Oral)   Resp 18   Ht 5\' 5"  (1.651 m)   Wt 186 lb 3.2 oz (84.5 kg)   SpO2 95%   BMI 30.99 kg/m  Wt Readings from Last 3 Encounters:  10/24/23 186 lb 3.2 oz (84.5 kg)  08/04/23 180 lb (81.6 kg)  10/22/22 186 lb (84.4 kg)    Diabetic Foot Exam - Simple   No data filed    Lab Results   Component Value Date   WBC 8.6 08/04/2023   HGB 13.8 08/04/2023   HCT 40.5 08/04/2023   PLT 217 08/04/2023   GLUCOSE 130 (H) 08/04/2023   CHOL 218 (A) 09/24/2021   TRIG 105 09/24/2021   HDL 59 09/24/2021   LDLCALC 138 09/24/2021   ALT 22 08/04/2023   AST 27 08/04/2023   NA 139 08/04/2023   K 3.8 08/04/2023   CL 105 08/04/2023   CREATININE 1.40 (H) 08/04/2023   BUN 22 08/04/2023   CO2 24 08/04/2023   TSH 2.94 09/24/2021   PSA 9.530 09/24/2021   HGBA1C 6.0 09/24/2021    Lab Results  Component Value Date   TSH 2.94 09/24/2021   Lab Results  Component Value Date   WBC 8.6 08/04/2023   HGB 13.8 08/04/2023   HCT 40.5 08/04/2023   MCV 88.6 08/04/2023   PLT 217 08/04/2023   Lab Results  Component Value Date   NA 139 08/04/2023   K 3.8 08/04/2023   CO2 24 08/04/2023   GLUCOSE 130 (H) 08/04/2023   BUN 22 08/04/2023   CREATININE 1.40 (H) 08/04/2023   BILITOT 0.7 08/04/2023   ALKPHOS 70 08/04/2023   AST 27 08/04/2023   ALT 22 08/04/2023   PROT 6.5 08/04/2023   ALBUMIN 3.8 08/04/2023   CALCIUM 9.4 08/04/2023   ANIONGAP 10 08/04/2023   GFR 82.70 06/01/2021   Lab Results  Component Value Date   CHOL 218 (A) 09/24/2021   Lab Results  Component Value Date   HDL 59 09/24/2021   Lab Results  Component Value Date   LDLCALC 138 09/24/2021   Lab Results  Component Value Date   TRIG 105 09/24/2021   Lab Results  Component Value Date   CHOLHDL 4 06/01/2021   Lab Results  Component Value Date   HGBA1C 6.0 09/24/2021       Assessment & Plan:  Preventative health care Assessment & Plan: Patient encouraged to maintain heart healthy diet, regular exercise, adequate sleep. Consider daily probiotics. Take medications as prescribed. Labs ordered and reviewed Colonoscopy was roughly 9 years ago per patient Last Tetanus 2013, repeat every 10 years at [place] RSV (respiratory syncitial virus) vaccine at pharmacy, Arexvy High dose flu shot done at  [facility]   Hyperglycemia Assessment & Plan: hgba1c acceptable, minimize simple carbs. Increase exercise as tolerated.   Hyperlipidemia, mixed Assessment & Plan: Encourage heart healthy diet such as MIND or DASH diet, increase exercise, avoid trans fats, simple carbohydrates and processed foods, consider a krill or fish or flaxseed oil cap daily.    Obesity, unspecified class, unspecified obesity type, unspecified whether serious comorbidity present Assessment & Plan: Encouraged DASH or MIND diet, decrease po intake and increase exercise as tolerated. Needs 7-8 hours of sleep nightly. Avoid trans fats, eat small, frequent meals every 4-5 hours with lean proteins, complex carbs and healthy  fats. Minimize simple carbs, high fat foods and processed foods   Vitamin D deficiency Assessment & Plan: Supplement and monitor   Elevated PSA Assessment & Plan: He is following with Dr Eldridge Abrahams at [facility] center.    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 Pain is worse with prolonged sitting and improves with movement. Currently managed with heat, ice, and occasional cyclobenzaprine. -Consider switching from cyclobenzaprine to tizanidine for muscle relaxation if needed. -Continue physical therapy as planned at the [place] starting December 3rd.  Prostate Management PSA decreased from 9 to 4 on finasteride and tolterodine. No diagnosis of prostate cancer. -Continue current management with urologist.  Renal Stones Two 3mm stones, one in each kidney. Managed with hydration and lemon water. -Continue current management and discuss further with urologist if needed.  General Health  Maintenance -Received RSV vaccine on October 17, 2023. -Received flu shot (date not specified). -Plan to receive COVID-19 booster shot 2 weeks after RSV vaccine. -Annual dermatology check-ups at the [facility], last one in February 2024 with a small spot removed. -Six-month check-in scheduled, with the option for a virtual visit.         Danise Edge, MD

## 2023-10-24 NOTE — Assessment & Plan Note (Signed)
He is following with Dr Eldridge Abrahams at [facility] center.

## 2023-10-24 NOTE — Assessment & Plan Note (Signed)
Struggling with ongoing back pain and is seeking care at [facility] and has been told he has significant disease throughout the spine will request records

## 2023-10-27 ENCOUNTER — Other Ambulatory Visit: Payer: Medicare Other

## 2023-10-27 ENCOUNTER — Other Ambulatory Visit
Admission: RE | Admit: 2023-10-27 | Discharge: 2023-10-27 | Disposition: A | Discharge status: Home / Self Care | Payer: Medicare Other | Source: Ambulatory Visit | Attending: Oncology | Admitting: Oncology

## 2023-10-27 DIAGNOSIS — Z006 Encounter for examination for normal comparison and control in clinical research program: Secondary | ICD-10-CM | POA: Insufficient documentation

## 2023-10-31 DIAGNOSIS — N400 Enlarged prostate without lower urinary tract symptoms: Secondary | ICD-10-CM | POA: Diagnosis not present

## 2023-10-31 DIAGNOSIS — R972 Elevated prostate specific antigen [PSA]: Secondary | ICD-10-CM | POA: Diagnosis not present

## 2023-11-06 LAB — HELIX MOLECULAR SCREEN: Genetic Analysis Overall Interpretation: NEGATIVE

## 2023-11-06 LAB — GENECONNECT MOLECULAR SCREEN

## 2023-11-12 DIAGNOSIS — K080 Exfoliation of teeth due to systemic causes: Secondary | ICD-10-CM | POA: Diagnosis not present

## 2023-11-13 ENCOUNTER — Other Ambulatory Visit: Payer: Self-pay

## 2023-11-14 ENCOUNTER — Other Ambulatory Visit: Payer: Self-pay

## 2023-11-20 ENCOUNTER — Other Ambulatory Visit (HOSPITAL_BASED_OUTPATIENT_CLINIC_OR_DEPARTMENT_OTHER): Payer: Self-pay

## 2023-11-20 MED ORDER — COMIRNATY 30 MCG/0.3ML IM SUSY
0.3000 mL | PREFILLED_SYRINGE | Freq: Once | INTRAMUSCULAR | 0 refills | Status: AC
  Filled 2023-11-20: qty 0.3, 1d supply, fill #0

## 2023-12-11 ENCOUNTER — Other Ambulatory Visit: Payer: Self-pay | Admitting: Family

## 2023-12-17 ENCOUNTER — Telehealth: Payer: Self-pay | Admitting: Family Medicine

## 2023-12-17 ENCOUNTER — Other Ambulatory Visit (HOSPITAL_BASED_OUTPATIENT_CLINIC_OR_DEPARTMENT_OTHER): Payer: Self-pay

## 2023-12-17 ENCOUNTER — Other Ambulatory Visit: Payer: Self-pay | Admitting: Family Medicine

## 2023-12-17 MED ORDER — DIAZEPAM 10 MG PO TABS
10.0000 mg | ORAL_TABLET | Freq: Every day | ORAL | 1 refills | Status: AC | PRN
  Filled 2023-12-17: qty 30, 30d supply, fill #0
  Filled 2024-02-03: qty 30, 30d supply, fill #1

## 2023-12-17 NOTE — Telephone Encounter (Signed)
Requesting: Diazepam (Valium) 10 mg Tablet Contract: 10/30/2022 UDS:10/22/2022 Last Visit:10/24/2023 Next Visit:04/23/2024 Last Refill:10/05/2023  Please Advise

## 2023-12-17 NOTE — Telephone Encounter (Signed)
Pt came in needinf refill of Valium sent to pharmacy downstairs. Please call pt when sent in.

## 2024-03-03 LAB — BASIC METABOLIC PANEL WITH GFR
BUN: 14 (ref 4–21)
CO2: 29 — AB (ref 13–22)
Chloride: 108 (ref 99–108)
Creatinine: 1.1 (ref 0.6–1.3)
Glucose: 104
Potassium: 4.6 meq/L (ref 3.5–5.1)
Sodium: 146 (ref 137–147)

## 2024-03-03 LAB — LIPID PANEL
Cholesterol: 221 — AB (ref 0–200)
HDL: 61 (ref 35–70)
LDL Cholesterol: 141
Triglycerides: 145 (ref 40–160)

## 2024-03-03 LAB — CBC AND DIFFERENTIAL
HCT: 43 (ref 41–53)
Hemoglobin: 14.4 (ref 13.5–17.5)
Platelets: 224 10*3/uL (ref 150–400)
WBC: 4.5

## 2024-03-03 LAB — COMPREHENSIVE METABOLIC PANEL WITH GFR
Albumin: 4.4 (ref 3.5–5.0)
Calcium: 9.8 (ref 8.7–10.7)
eGFR: 71

## 2024-03-03 LAB — HEMOGLOBIN A1C
EGFR (Non-African Amer.): 71
Hemoglobin A1C: 5.8
TSH: 2.05 (ref 0.41–5.90)

## 2024-03-03 LAB — VITAMIN D 25 HYDROXY (VIT D DEFICIENCY, FRACTURES): Vit D, 25-Hydroxy: 48.4

## 2024-03-03 LAB — CBC: RBC: 4.79 (ref 3.87–5.11)

## 2024-03-03 LAB — HEPATIC FUNCTION PANEL
ALT: 24 U/L (ref 10–40)
AST: 22 (ref 14–40)
Alkaline Phosphatase: 78 (ref 25–125)
Bilirubin, Direct: 0.1
Bilirubin, Total: 0.7

## 2024-04-19 NOTE — Assessment & Plan Note (Signed)
 Encourage heart healthy diet such as MIND or DASH diet, increase exercise, avoid trans fats, simple carbohydrates and processed foods, consider a krill or fish or flaxseed oil cap daily.

## 2024-04-19 NOTE — Assessment & Plan Note (Signed)
 hgba1c acceptable, minimize simple carbs. Increase exercise as tolerated.

## 2024-04-19 NOTE — Assessment & Plan Note (Signed)
 Encouraged DASH or MIND diet, decrease po intake and increase exercise as tolerated. Needs 7-8 hours of sleep nightly. Avoid trans fats, eat small, frequent meals every 4-5 hours with lean proteins, complex carbs and healthy fats. Minimize simple carbs, high fat foods and processed foods

## 2024-04-19 NOTE — Assessment & Plan Note (Signed)
 Supplement and monitor

## 2024-04-23 ENCOUNTER — Ambulatory Visit (INDEPENDENT_AMBULATORY_CARE_PROVIDER_SITE_OTHER): Payer: Medicare Other | Admitting: Family Medicine

## 2024-04-23 ENCOUNTER — Encounter: Payer: Self-pay | Admitting: Family Medicine

## 2024-04-23 VITALS — BP 134/82 | HR 76 | Temp 97.9°F | Resp 16 | Ht 65.0 in | Wt 178.0 lb

## 2024-04-23 DIAGNOSIS — E669 Obesity, unspecified: Secondary | ICD-10-CM

## 2024-04-23 DIAGNOSIS — E559 Vitamin D deficiency, unspecified: Secondary | ICD-10-CM

## 2024-04-23 DIAGNOSIS — E782 Mixed hyperlipidemia: Secondary | ICD-10-CM

## 2024-04-23 DIAGNOSIS — R739 Hyperglycemia, unspecified: Secondary | ICD-10-CM | POA: Diagnosis not present

## 2024-04-23 DIAGNOSIS — Z1211 Encounter for screening for malignant neoplasm of colon: Secondary | ICD-10-CM

## 2024-04-23 NOTE — Patient Instructions (Signed)
Arthritis Arthritis is a term that is commonly used to refer to joint pain or joint disease. There are more than 100 types of arthritis. What are the causes? The most common cause of this condition is wear and tear of a joint. Other causes include: Gout. Inflammation of a joint. An infection of a joint. Sprains and other injuries near the joint. A reaction to medicines or drugs, or an allergic reaction. In some cases, the cause may not be known. What are the signs or symptoms? The main symptom of this condition is pain in the joint during movement. Other symptoms include: Redness, swelling, or stiffness at a joint. Warmth coming from the joint. Fever. Overall feeling of illness. How is this diagnosed? This condition may be diagnosed with a physical exam and tests, including: Blood tests. Urine tests. Imaging tests, such as X-rays, an MRI, or a CT scan. Sometimes, fluid is removed from a joint for testing. How is this treated? This condition may be treated with: Treatment of the cause, if it is known. Rest. Raising (elevating) the joint. Applying cold or hot packs to the joint. Medicines to improve symptoms and reduce inflammation. Injections of a steroid, such as cortisone, into the joint to help reduce pain and inflammation. Depending on the cause of your arthritis, you may need to make lifestyle changes to reduce stress on your joint. Changes may include: Exercising more. Losing weight. Follow these instructions at home: Medicines Take over-the-counter and prescription medicines only as told by your health care provider. Do not take aspirin to relieve pain if your health care provider thinks that gout may be causing your pain. Activity Rest your joint if told by your health care provider. Rest is important when your disease is active and your joint feels painful, swollen, or stiff. Avoid activities that make the pain worse. Balance activity with rest. Exercise your joint  regularly with range-of-motion exercises as told by your health care provider. Try doing low-impact exercise, such as: Swimming. Water aerobics. Biking. Walking. Managing pain, stiffness, and swelling     If directed, put ice on the affected joint. To do this: Put ice in a plastic bag. Place a towel between your skin and the bag. Leave the ice on for 20 minutes, 2-3 times a day. Remove the ice if your skin turns bright red. This is very important. If you cannot feel pain, heat, or cold, you have a greater risk of damage to the area. If your joint is swollen, raise (elevate) it above the level of your heart if directed by your health care provider. If your joint feels stiff in the morning, try taking a warm shower. If directed, apply heat to the affected area as often as told by your health care provider. Use the heat source that your health care provider recommends, such as a moist heat pack or a heating pad. To apply heat: Place a towel between your skin and the heat source. Leave the heat on for 20-30 minutes. Remove the heat if your skin turns bright red. This is especially important if you are unable to feel pain, heat, or cold. You have a greater risk of getting burned. General instructions Maintain a healthy weight. Follow instructions from your health care provider for weight control. Do not use any products that contain nicotine or tobacco. These products include cigarettes, chewing tobacco, and vaping devices, such as e-cigarettes. If you need help quitting, ask your health care provider. Keep all follow-up visits. This is important. Where  to find more information [employer]: www.niams.http://www.myers.net/ Contact a health care provider if: The pain gets worse. You have a fever. Get help right away if: You develop severe joint pain, swelling, or redness. Many joints become painful and swollen. You develop severe back pain. You develop severe weakness in your  leg. Summary Arthritis is a term that is commonly used to refer to joint pain or joint disease. There are more than 100 types of arthritis. The most common cause of this condition is wear and tear of a joint. Other causes include gout, inflammation or infection of the joint, sprains, or allergies. Symptoms of this condition include redness, swelling, or stiffness of the joint. Other symptoms include warmth, fever, or feeling ill. This condition is treated with rest, elevation, medicines, and applying cold or hot packs. Follow your health care provider's instructions about medicines, activity, exercises, and other home care treatments. This information is not intended to replace advice given to you by your health care provider. Make sure you discuss any questions you have with your health care provider. Document Revised: 09/19/2021 Document Reviewed: 09/19/2021 Elsevier Patient Education  2024 [employer].

## 2024-04-26 ENCOUNTER — Encounter: Payer: Self-pay | Admitting: Family Medicine

## 2024-04-26 NOTE — Progress Notes (Signed)
 Subjective:    Patient ID: Christopher Haynes, male    DOB: Sep 18, 1946, 78 y.o.   MRN: [id number]  Chief Complaint  Patient presents with   Medical Management of Chronic Issues    Patient presents today for a 6 month follow-up    HPI Discussed the use of AI scribe software for clinical note transcription with the patient, who gave verbal consent to proceed.  History of Present Illness Christopher Haynes is a 78 year old male with osteoarthritis who presents with joint pain management.  He experiences significant joint pain, particularly in his knees and knuckles. His knees are worsening, with difficulty in activities such as climbing stairs. The right knee is especially problematic, with a clicking sound and occasional buckling when tired. He attributes some of this to his active lifestyle, which includes a lot of lifting and climbing.  He uses cyclobenzaprine  as a muscle relaxer and occasionally takes diazepam  and tramadol  for pain management, particularly when over-the-counter medications like Advil  are ineffective. He previously used ibuprofen  800 mg but has since reduced the dosage to 400 mg due to concerns about side effects.  He experiences postnasal drip, which he attributes to seasonal allergies and pollen. He uses nasal saline frequently to manage symptoms, especially after being outdoors. He does not currently use Zyrtec or cetirizine.  He has been taking atorvastatin, prescribed by the [place], and reports occasional muscle spasms around his chest, which he associates with physical activity. No significant side effects from the atorvastatin.  No recent emergency room visits, significant chest pain, or febrile illnesses. No new issues with bowel or urinary function.    Past Medical History:  Diagnosis Date   Anxiety 08/05/2017   Arthritis ?   Back pain 08/20/2016   Benign prostatic hyperplasia (BPH) with urinary urgency 02/09/2016   Cataract 2024   Constipation 02/20/2016   Contusion  02/20/2016   Depression    Elevated PSA    H/O measles    Headache    History of colonic polyps 02/09/2016   [facility] years [facility]. Now on 5 year plan   Hyperglycemia 08/20/2016   Hyperlipidemia, mixed 02/08/2017   Macular degeneration of left eye 02/19/2016   Migraine with visual aura 02/09/2016   Neck pain 08/05/2017   OSA on CPAP    Osteoporosis    Pain in joint, shoulder region 02/19/2016   Peripheral neuropathy 08/05/2017   Sleep apnea 2005   Thyroid  disease    thyroiditis, h/o in 20s    Past Surgical History:  Procedure Laterality Date   ANKLE FRACTURE SURGERY  66   KNEE ARTHROSCOPY WITH SUBCHONDROPLASTY Left 12/13/2017   Procedure: KNEE ARTHROSCOPY WITH SUBCHONDROPLASTY;  Surgeon: Dayne Even, MD;  Location: [facility];  Service: Orthopedics;  Laterality: Left;   SKIN GRAFT  Age 64   TONSILLECTOMY  Age 58    Family History  Problem Relation Age of Onset   Hypertension Mother    Diabetes Mother    Colon cancer Father    Cancer Father        rectal with mets   Hypertension Brother    Obesity Son    Other Son        fatty liver   Allergic Disorder Son        seasonal   Stroke Paternal Grandfather     Social History   Socioeconomic History   Marital status: Married    Spouse name: Not on file  Number of children: Not on file   Years of education: Not on file   Highest education level: Bachelor's degree (e.g., BA, AB, BS)  Occupational History   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  Tobacco Use   Smoking status: Never   Smokeless tobacco: Never  Substance and Sexual Activity   Alcohol use: Yes    Alcohol/week: 4.0 standard drinks of alcohol    Types: 4 Cans of beer per week   Drug use: No   Sexual activity: Yes    Comment: lives with wife, work in Audiological scientist estate, no dietary restrictions  Other Topics Concern   Not on file  Social History Narrative   Not on file   Social Drivers of Health   Financial  Resource Strain: Low Risk  (04/22/2024)   Overall Financial Resource Strain (CARDIA)    Difficulty of Paying Living Expenses: Not very hard  Food Insecurity: No Food Insecurity (04/22/2024)   Hunger Vital Sign    Worried About Running Out of Food in the Last Year: Never true    Ran Out of Food in the Last Year: Never true  Transportation Needs: No Transportation Needs (04/22/2024)   PRAPARE - Administrator, Civil Service (Medical): No    Lack of Transportation (Non-Medical): No  Physical Activity: Sufficiently Active (04/22/2024)   Exercise Vital Sign    Days of Exercise per Week: 4 days    Minutes of Exercise per Session: 60 min  Stress: Stress Concern Present (04/22/2024)   [employer] of Occupational Health - Occupational Stress Questionnaire    Feeling of Stress : To some extent  Social Connections: Unknown (04/22/2024)   Social Connection and Isolation Panel [NHANES]    Frequency of Communication with Friends and Family: Twice a week    Frequency of Social Gatherings with Friends and Family: Once a week    Attends Religious Services: Patient declined    Database administrator or Organizations: No    Attends Engineer, structural: 1 to 4 times per year    Marital Status: Married  Catering manager Violence: Not At Risk (08/14/2023)   Humiliation, Afraid, Rape, and Kick questionnaire    Fear of Current or Ex-Partner: No    Emotionally Abused: No    Physically Abused: No    Sexually Abused: No    Outpatient Medications Prior to Visit  Medication Sig Dispense Refill   atorvastatin (LIPITOR) 20 MG tablet Take 20 mg by mouth daily.     Calcium Carbonate (CALCIUM 500 PO) Take by mouth.     Cholecalciferol 50 MCG (2000 UT) TABS Take 1 tablet by mouth daily.     cyclobenzaprine  (FLEXERIL ) 10 MG tablet TAKE ONE TABLET BY MOUTH AT BEDTIME AS NEEDED FOR MUSCLE SPASMS     diazepam  (VALIUM ) 10 MG tablet Take 1 tablet (10 mg total) by mouth daily as needed. 30 tablet 1    diclofenac sodium (VOLTAREN) 1 % GEL Apply 2 g topically 4 (four) times daily.     EPINEPHrine  0.3 mg/0.3 mL IJ SOAJ injection INJECT 0.3 ML 0.3MG /0.3ML INTRAMUSCULARLY ONCE AS NEEDED USE FOR INSTANCES OF THROAT SWELLING AND DIFFICULTY BREATHING--THEN GO TO  ER USE FOR INSTANCES OF THROAT SWELLING AND DIFFICULTY BREATHING--THEN GO TO   ER     famotidine  (PEPCID ) 20 MG tablet Take 1 tablet (20 mg total) by mouth 2 (two) times daily. 60 tablet    finasteride  (PROSCAR ) 5 MG tablet Take 1 tablet (5 mg total) by mouth daily.  90 tablet 3   sildenafil  (VIAGRA ) 100 MG tablet Take 1 tablet (100 mg total) by mouth 60 minutes prior to intercourse. 10 tablet 11   tamsulosin (FLOMAX) 0.4 MG CAPS capsule Take 0.8 mg by mouth daily.      traMADol  (ULTRAM ) 50 MG tablet Take 1 tablet (50 mg total) by mouth daily as needed. 30 tablet 0   traZODone (DESYREL) 100 MG tablet Take by mouth.     cetirizine (ZYRTEC) 10 MG tablet Take 10 mg by mouth daily.     ibuprofen  (ADVIL ) 800 MG tablet Take 1 tablet (800 mg total) by mouth every 6 (six) hours for pain. 40 tablet 0   HYDROcodone -acetaminophen  (NORCO) 5-325 MG tablet Take 1 tablet by mouth every 6 (six) hours as needed for severe pain. 10 tablet 0   No facility-administered medications prior to visit.    Allergies  Allergen Reactions   Sumatriptan Nausea And Vomiting   Tolmetin     Other reaction(s): Other (See Comments)    Review of Systems  Constitutional:  Negative for fever and malaise/fatigue.  HENT:  Positive for congestion.   Eyes:  Negative for blurred vision.  Respiratory:  Negative for shortness of breath.   Cardiovascular:  Negative for chest pain, palpitations and leg swelling.  Gastrointestinal:  Negative for abdominal pain, blood in stool and nausea.  Genitourinary:  Negative for dysuria and frequency.  Musculoskeletal:  Positive for joint pain and myalgias. Negative for falls.  Skin:  Negative for rash.  Neurological:  Negative for  dizziness, loss of consciousness and headaches.  Endo/Heme/Allergies:  Negative for environmental allergies.  Psychiatric/Behavioral:  Negative for depression. The patient is not nervous/anxious.        Objective:    Physical Exam Vitals reviewed.  Constitutional:      Appearance: Normal appearance. He is not ill-appearing.  HENT:     Head: Normocephalic and atraumatic.     Nose: Nose normal.  Eyes:     Conjunctiva/sclera: Conjunctivae normal.  Cardiovascular:     Rate and Rhythm: Normal rate.     Pulses: Normal pulses.     Heart sounds: Normal heart sounds. No murmur heard. Pulmonary:     Effort: Pulmonary effort is normal.     Breath sounds: Normal breath sounds. No wheezing.  Abdominal:     Palpations: Abdomen is soft. There is no mass.     Tenderness: There is no abdominal tenderness.  Musculoskeletal:     Cervical back: Normal range of motion.     Right lower leg: No edema.     Left lower leg: No edema.  Skin:    General: Skin is warm and dry.  Neurological:     General: No focal deficit present.     Mental Status: He is alert and oriented to person, place, and time.  Psychiatric:        Mood and Affect: Mood normal.     BP 134/82   Pulse 76   Temp 97.9 F (36.6 C)   Resp 16   Ht 5\' 5"  (1.651 m)   Wt 178 lb (80.7 kg)   SpO2 97%   BMI 29.62 kg/m  Wt Readings from Last 3 Encounters:  04/23/24 178 lb (80.7 kg)  10/24/23 186 lb 3.2 oz (84.5 kg)  08/04/23 180 lb (81.6 kg)    Diabetic Foot Exam - Simple   No data filed    Lab Results  Component Value Date   WBC 4.5 03/03/2024  HGB 14.4 03/03/2024   HCT 43 03/03/2024   PLT 224 03/03/2024   GLUCOSE 130 (H) 08/04/2023   CHOL 221 (A) 03/03/2024   TRIG 145 03/03/2024   HDL 61 03/03/2024   LDLCALC 141 03/03/2024   ALT 24 03/03/2024   AST 22 03/03/2024   NA 146 03/03/2024   K 4.6 03/03/2024   CL 108 03/03/2024   CREATININE 1.1 03/03/2024   BUN 14 03/03/2024   CO2 29 (A) 03/03/2024   TSH 2.94  09/24/2021   PSA 9.530 09/24/2021   HGBA1C 5.8 03/03/2024    Lab Results  Component Value Date   TSH 2.94 09/24/2021   Lab Results  Component Value Date   WBC 4.5 03/03/2024   HGB 14.4 03/03/2024   HCT 43 03/03/2024   MCV 88.6 08/04/2023   PLT 224 03/03/2024   Lab Results  Component Value Date   NA 146 03/03/2024   K 4.6 03/03/2024   CO2 29 (A) 03/03/2024   GLUCOSE 130 (H) 08/04/2023   BUN 14 03/03/2024   CREATININE 1.1 03/03/2024   BILITOT 0.7 08/04/2023   ALKPHOS 78 03/03/2024   AST 22 03/03/2024   ALT 24 03/03/2024   PROT 6.5 08/04/2023   ALBUMIN 4.4 03/03/2024   CALCIUM 9.8 03/03/2024   ANIONGAP 10 08/04/2023   EGFR 71 03/03/2024   GFR 82.70 06/01/2021   Lab Results  Component Value Date   CHOL 221 (A) 03/03/2024   Lab Results  Component Value Date   HDL 61 03/03/2024   Lab Results  Component Value Date   LDLCALC 141 03/03/2024   Lab Results  Component Value Date   TRIG 145 03/03/2024   Lab Results  Component Value Date   CHOLHDL 4 06/01/2021   Lab Results  Component Value Date   HGBA1C 5.8 03/03/2024       Assessment & Plan:  Hyperglycemia Assessment & Plan: hgba1c acceptable, minimize simple carbs. Increase exercise as tolerated.   Hyperlipidemia, mixed Assessment & Plan: Encourage heart healthy diet such as MIND or DASH diet, increase exercise, avoid trans fats, simple carbohydrates and processed foods, consider a krill or fish or flaxseed oil cap daily.    Obesity, unspecified class, unspecified obesity type, unspecified whether serious comorbidity present Assessment & Plan: Encouraged DASH or MIND diet, decrease po intake and increase exercise as tolerated. Needs 7-8 hours of sleep nightly. Avoid trans fats, eat small, frequent meals every 4-5 hours with lean proteins, complex carbs and healthy fats. Minimize simple carbs, high fat foods and processed foods   Vitamin D  deficiency Assessment & Plan: Supplement and  monitor   Screening for colon cancer    Assessment and Plan Assessment & Plan Osteoarthritis of the Knee Chronic osteoarthritis affecting knees, right knee more symptomatic. Discussed non-surgical interventions to maintain function. - Encourage continued activity. - Consider sports medicine referral for evaluation and potential injections if symptoms worsen.  Wellness Visit Routine wellness visit. Discussed activity, hydration, and diet for aging. Reviewed medications and potential future need for Prevnar 20 booster. - Continue Cyclobenzaprine , Diazepam , Tramadol  as needed. - Discontinue Ibuprofen  800 mg; use 400 mg every 6 hours as needed. - Monitor blood work in 6-8 months, including sugar and cholesterol levels. [facility]. - Consider sports medicine referral if symptoms worsen.  Hyperlipidemia, mixed Mixed hyperlipidemia managed with atorvastatin. No significant muscle issues reported. Discussed cholesterol monitoring in 3-6 months. - Continue atorvastatin. - Monitor cholesterol levels in 3-6 months.  Allergic Rhinitis Allergic rhinitis  with postnasal drip, likely pollen-related. Nasal saline used for relief. Cetirizine discontinued. - Continue nasal saline as needed. - Discontinue cetirizine.     Randie Bustle, MD

## 2024-05-20 ENCOUNTER — Ambulatory Visit: Payer: Self-pay | Admitting: Family Medicine

## 2024-05-20 DIAGNOSIS — K080 Exfoliation of teeth due to systemic causes: Secondary | ICD-10-CM | POA: Diagnosis not present

## 2024-08-12 ENCOUNTER — Ambulatory Visit (INDEPENDENT_AMBULATORY_CARE_PROVIDER_SITE_OTHER)

## 2024-08-12 VITALS — BP 130/70 | Ht 65.0 in | Wt 170.0 lb

## 2024-08-12 DIAGNOSIS — Z0000 Encounter for general adult medical examination without abnormal findings: Secondary | ICD-10-CM

## 2024-08-12 NOTE — Progress Notes (Signed)
 Because this visit was a virtual/telehealth visit,  certain criteria was not obtained, such a blood pressure, CBG if applicable, and timed get up and go. Any medications not marked as taking were not mentioned during the medication reconciliation part of the visit. Any vitals not documented were not able to be obtained due to this being a telehealth visit or patient was unable to self-report a recent blood pressure reading due to a lack of equipment at home via telehealth. Vitals that have been documented are verbally provided by the patient.  This visit was performed by a medical professional under my direct supervision. I was immediately available for consultation/collaboration. I have reviewed and agree with the Annual Wellness Visit documentation.  Subjective:   Christopher Haynes is a 77 y.o. who presents for a Medicare Wellness preventive visit.  As a reminder, Annual Wellness Visits don't include a physical exam, and some assessments may be limited, especially if this visit is performed virtually. We may recommend an in-person follow-up visit with your provider if needed.  Visit Complete: Virtual I connected with  Christopher Haynes on 08/12/24 by a audio enabled telemedicine application and verified that I am speaking with the correct person using two identifiers.  Patient Location: Home  Provider Location: Home Office  I discussed the limitations of evaluation and management by telemedicine. The patient expressed understanding and agreed to proceed.  Vital Signs: Because this visit was a virtual/telehealth visit, some criteria may be missing or patient reported. Any vitals not documented were not able to be obtained and vitals that have been documented are patient reported.  VideoDeclined- This patient declined Librarian, academic. Therefore the visit was completed with audio only.  Persons Participating in Visit: Patient.  AWV Questionnaire: Yes: Patient Medicare  AWV questionnaire was completed by the patient on 08/11/2024; I have confirmed that all information answered by patient is correct and no changes since this date.  Cardiac Risk Factors include: advanced age (>22men, >39 women);male gender     Objective:    Today's Vitals   08/12/24 1420  BP: 130/70  Weight: 170 lb (77.1 kg)  Height: 5' 5 (1.651 m)   Body mass index is 28.29 kg/m.     08/12/2024    2:23 PM 08/14/2023    1:45 PM 08/04/2023    1:17 PM 07/31/2022    2:32 PM 05/04/2020   10:02 AM 12/13/2017   11:12 AM 12/06/2017   11:24 AM  Advanced Directives  Does Patient Have a Medical Advance Directive? Yes Yes No Yes Yes No  No   Type of Estate agent of [place];Living will Living will;Healthcare Power of [employer] Power of [employer] Power of [place];Living will    Does patient want to make changes to medical advance directive? No - Patient declined No - Patient declined   No - Patient declined    Copy of Healthcare Power of Attorney in Chart? Yes - validated most recent copy scanned in chart (See row information) Yes - validated most recent copy scanned in chart (See row information)  No - copy requested No - copy requested    Would patient like information on creating a medical advance directive?      No - Patient declined       Data saved with a previous flowsheet row definition    Current Medications (verified) Outpatient Encounter Medications as of 08/12/2024  Medication Sig   atorvastatin (LIPITOR) 20 MG tablet Take 20 mg by mouth  daily.   Calcium Carbonate (CALCIUM 500 PO) Take by mouth.   Cholecalciferol 50 MCG (2000 UT) TABS Take 1 tablet by mouth daily.   cyclobenzaprine  (FLEXERIL ) 10 MG tablet TAKE ONE TABLET BY MOUTH AT BEDTIME AS NEEDED FOR MUSCLE SPASMS   diclofenac sodium (VOLTAREN) 1 % GEL Apply 2 g topically 4 (four) times daily.   EPINEPHrine  0.3 mg/0.3 mL IJ SOAJ injection INJECT 0.3 ML 0.3MG /0.3ML INTRAMUSCULARLY  ONCE AS NEEDED USE FOR INSTANCES OF THROAT SWELLING AND DIFFICULTY BREATHING--THEN GO TO  ER USE FOR INSTANCES OF THROAT SWELLING AND DIFFICULTY BREATHING--THEN GO TO   ER   famotidine  (PEPCID ) 20 MG tablet Take 1 tablet (20 mg total) by mouth 2 (two) times daily.   finasteride  (PROSCAR ) 5 MG tablet Take 1 tablet (5 mg total) by mouth daily.   sildenafil  (VIAGRA ) 100 MG tablet Take 1 tablet (100 mg total) by mouth 60 minutes prior to intercourse.   tamsulosin (FLOMAX) 0.4 MG CAPS capsule Take 0.8 mg by mouth daily.    traMADol  (ULTRAM ) 50 MG tablet Take 1 tablet (50 mg total) by mouth daily as needed.   traZODone (DESYREL) 100 MG tablet Take by mouth.   No facility-administered encounter medications on file as of 08/12/2024.    Allergies (verified) Sumatriptan and Tolmetin   History: Past Medical History:  Diagnosis Date   Anxiety 08/05/2017   Arthritis ?   Back pain 08/20/2016   Benign prostatic hyperplasia (BPH) with urinary urgency 02/09/2016   Cataract 2024   Constipation 02/20/2016   Contusion 02/20/2016   Depression    Elevated PSA    H/O measles    Headache    History of colonic polyps 02/09/2016   [facility] years [facility]. Now on 5 year plan   Hyperglycemia 08/20/2016   Hyperlipidemia, mixed 02/08/2017   Macular degeneration of left eye 02/19/2016   Migraine with visual aura 02/09/2016   Neck pain 08/05/2017   OSA on CPAP    Osteoporosis    Pain in joint, shoulder region 02/19/2016   Peripheral neuropathy 08/05/2017   Sleep apnea 2005   Thyroid  disease    thyroiditis, h/o in 51s   Past Surgical History:  Procedure Laterality Date   ANKLE FRACTURE SURGERY  66   KNEE ARTHROSCOPY WITH SUBCHONDROPLASTY Left 12/13/2017   Procedure: KNEE ARTHROSCOPY WITH SUBCHONDROPLASTY;  Surgeon: Sheril Coy, MD;  Location: [facility];  Service: Orthopedics;  Laterality: Left;   SKIN GRAFT  Age 11   TONSILLECTOMY  Age 68   Family  History  Problem Relation Age of Onset   Hypertension Mother    Diabetes Mother    Colon cancer Father    Cancer Father        rectal with mets   Hypertension Brother    Obesity Son    Other Son        fatty liver   Allergic Disorder Son        seasonal   Stroke Paternal Grandfather    Social History   Socioeconomic History   Marital status: Married    Spouse name: Not on file   Number of children: Not on file   Years of education: Not on file   Highest education level: Bachelor's degree (e.g., BA, AB, BS)  Occupational History   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  Tobacco Use   Smoking status: Never   Smokeless tobacco: Never  Substance and Sexual Activity   Alcohol use: Yes  Alcohol/week: 4.0 standard drinks of alcohol    Types: 4 Cans of beer per week   Drug use: No   Sexual activity: Yes    Comment: lives with wife, work in Audiological scientist estate, no dietary restrictions  Other Topics Concern   Not on file  Social History Narrative   Not on file   Social Drivers of Health   Financial Resource Strain: Low Risk  (08/11/2024)   Overall Financial Resource Strain (CARDIA)    Difficulty of Paying Living Expenses: Not very hard  Food Insecurity: No Food Insecurity (08/11/2024)   Hunger Vital Sign    Worried About Running Out of Food in the Last Year: Never true    Ran Out of Food in the Last Year: Never true  Transportation Needs: No Transportation Needs (08/11/2024)   PRAPARE - Administrator, Civil Service (Medical): No    Lack of Transportation (Non-Medical): No  Physical Activity: Insufficiently Active (08/11/2024)   Exercise Vital Sign    Days of Exercise per Week: 5 days    Minutes of Exercise per Session: 20 min  Stress: Stress Concern Present (08/11/2024)   [employer] of Occupational Health - Occupational Stress Questionnaire    Feeling of Stress: Rather much  Social Connections: Socially Isolated (08/11/2024)   Social Connection and Isolation Panel     Frequency of Communication with Friends and Family: Once a week    Frequency of Social Gatherings with Friends and Family: Once a week    Attends Religious Services: Never    Database administrator or Organizations: No    Attends Engineer, structural: Not on file    Marital Status: Married    Tobacco Counseling Counseling given: Not Answered    Clinical Intake:  Pre-visit preparation completed: Yes  Pain : No/denies pain     BMI - recorded: 28.29 Nutritional Status: BMI 25 -29 Overweight Nutritional Risks: None Diabetes: No  Lab Results  Component Value Date   HGBA1C 5.8 03/03/2024   HGBA1C 6.0 09/24/2021   HGBA1C 5.9 02/23/2021     How often do you need to have someone help you when you read instructions, pamphlets, or other written materials from your doctor or pharmacy?: 1 - Never  Interpreter Needed?: No  Information entered by :: Rhyse Skowron,CMA   Activities of Daily Living     08/11/2024    3:54 PM  In your present state of health, do you have any difficulty performing the following activities:  Hearing? 1  Vision? 0  Difficulty concentrating or making decisions? 0  Walking or climbing stairs? 0  Dressing or bathing? 0  Doing errands, shopping? 0  Preparing Food and eating ? N  Using the Toilet? N  In the past six months, have you accidently leaked urine? N  Do you have problems with loss of bowel control? N  Managing your Medications? N  Managing your Finances? N  Housekeeping or managing your Housekeeping? N    Patient Care Team: Domenica Harlene LABOR, MD as PCP - General (Family Medicine) Roseann, Adine PARAS., MD as Referring Physician (Urology) Rosan Credit, MD as Consulting Physician (Ophthalmology) Verdie Bruckner, MD (Unknown Physician Specialty) Mulles, Corazon, MD as Referring Physician (Internal Medicine) [facility]  I have updated your Care Teams any recent Medical Services you may have received from  other providers in the past year.     Assessment:   This is a routine wellness examination for Christopher Haynes.  Hearing/Vision screen Hearing  Screening - Comments:: Patient wears hearing aids  Vision Screening - Comments:: Patient wears glasses    Goals Addressed             This Visit's Progress    Patient Stated       To stretch more       Depression Screen     08/12/2024    2:24 PM 10/24/2023    8:58 AM 08/14/2023    1:51 PM 10/22/2022    9:09 AM 07/31/2022    2:29 PM 02/23/2021    1:57 PM 08/25/2020   10:08 AM  PHQ 2/9 Scores  PHQ - 2 Score 2 0 0 0 1 0 1  PHQ- 9 Score 3   2   1     Fall Risk     08/11/2024    3:54 PM 10/24/2023    8:58 AM 08/07/2023    5:27 PM 10/22/2022    9:08 AM 07/31/2022    2:33 PM  Fall Risk   Falls in the past year? 0 0 0 0 0  Number falls in past yr: 0 0 0 0 0  Injury with Fall? 0 0 0 0 0  Risk for fall due to : No Fall Risks No Fall Risks No Fall Risks  Impaired vision  Follow up Falls evaluation completed Falls evaluation completed Falls evaluation completed Falls evaluation completed  Falls prevention discussed      Data saved with a previous flowsheet row definition    MEDICARE RISK AT HOME:  Medicare Risk at Home Any stairs in or around the home?: (Patient-Rptd) Yes If so, are there any without handrails?: (Patient-Rptd) No Home free of loose throw rugs in walkways, pet beds, electrical cords, etc?: (Patient-Rptd) Yes Adequate lighting in your home to reduce risk of falls?: (Patient-Rptd) Yes Life alert?: (Patient-Rptd) No Use of a cane, walker or w/c?: (Patient-Rptd) No Grab bars in the bathroom?: (Patient-Rptd) No Shower chair or bench in shower?: (Patient-Rptd) No Elevated toilet seat or a handicapped toilet?: (Patient-Rptd) No  TIMED UP AND GO:  Was the test performed?  No  Cognitive Function: 6CIT completed        08/12/2024    2:23 PM 08/14/2023    1:52 PM 07/31/2022    2:35 PM  6CIT Screen  What Year? 0 points 0 points 0  points  What month? 0 points 0 points 0 points  What time? 0 points 0 points 0 points  Count back from 20 0 points 0 points 0 points  Months in reverse 0 points 0 points 0 points  Repeat phrase 0 points 0 points 2 points  Total Score 0 points 0 points 2 points    Immunizations Immunization History  Administered Date(s) Administered   Fluad Quad(high Dose 65+) 08/24/2019, 09/16/2020, 09/15/2021, 09/14/2022   Influenza, High Dose Seasonal PF 09/24/2016   Influenza,inj,Quad PF,6+ Mos 10/21/2017   Influenza-Unspecified 02/15/2005, 10/11/2006, 10/09/2007, 09/28/2008, 11/30/2009, 09/20/2010, 09/28/2011, 09/18/2012, 11/04/2013, 10/19/2014, 09/24/2015, 11/09/2015, 09/23/2018   PFIZER Comirnaty ETTERGray Top)Covid-19 Tri-Sucrose Vaccine 05/05/2021   PFIZER(Purple Top)SARS-COV-2 Vaccination 01/15/2020, 02/05/2020, 10/23/2020   Pfizer Covid-19 Vaccine Bivalent Booster 71yrs & up 09/15/2021, 10/18/2022   Pfizer(Comirnaty )Fall Seasonal Vaccine 12 years and older 11/20/2023   Pneumococcal Conjugate-13 05/25/2014, 01/29/2018, 10/19/2021   Pneumococcal-Unspecified 12/24/2009, 05/14/2011   RSV,unspecified 10/17/2023   Tdap 12/25/2011, 03/24/2012, 10/22/2022   Zoster Recombinant(Shingrix) 04/08/2018, 07/08/2018    Screening Tests Health Maintenance  Topic Date Due   COVID-19 Vaccine (8 - Pfizer risk 2024-25 season) 05/19/2024  INFLUENZA VACCINE  07/24/2024   Pneumococcal Vaccine: 50+ Years (2 of 2 - PCV20 or PCV21) 10/23/2026 (Originally 10/19/2022)   Medicare Annual Wellness (AWV)  08/12/2025   Colonoscopy  07/08/2028   DTaP/Tdap/Td (4 - Td or Tdap) 10/22/2032   Hepatitis C Screening  Completed   Zoster Vaccines- Shingrix  Completed   HPV VACCINES  Aged Out   Meningococcal B Vaccine  Aged Out    Health Maintenance  Health Maintenance Due  Topic Date Due   COVID-19 Vaccine (8 - Pfizer risk 2024-25 season) 05/19/2024   INFLUENZA VACCINE  07/24/2024   Health Maintenance Items  Addressed:patient declined flu at this time   Additional Screening:  Vision Screening: Recommended annual ophthalmology exams for early detection of glaucoma and other disorders of the eye. Would you like a referral to an eye doctor? No    Dental Screening: Recommended annual dental exams for proper oral hygiene  Community Resource Referral / Chronic Care Management: CRR required this visit?  No   CCM required this visit?  No   Plan:    I have personally reviewed and noted the following in the patient's chart:   Medical and social history Use of alcohol, tobacco or illicit drugs  Current medications and supplements including opioid prescriptions. Patient is not currently taking opioid prescriptions. Functional ability and status Nutritional status Physical activity Advanced directives List of other physicians Hospitalizations, surgeries, and ER visits in previous 12 months Vitals Screenings to include cognitive, depression, and falls Referrals and appointments  In addition, I have reviewed and discussed with patient certain preventive protocols, quality metrics, and best practice recommendations. A written personalized care plan for preventive services as well as general preventive health recommendations were provided to patient.   Lyle MARLA Right, [place]   08/12/2024   After Visit Summary: (MyChart) Due to this being a telephonic visit, the after visit summary with patients personalized plan was offered to patient via MyChart   Notes: Nothing significant to report at this time.

## 2024-08-12 NOTE — Patient Instructions (Signed)
 Mr. Christopher Haynes , Thank you for taking time out of your busy schedule to complete your Annual Wellness Visit with me. I enjoyed our conversation and look forward to speaking with you again next year. I, as well as your care team,  appreciate your ongoing commitment to your health goals. Please review the following plan we discussed and let me know if I can assist you in the future. Your Game plan/ To Do List    Referrals: If you haven't heard from the office you've been referred to, please reach out to them at the phone provided.   Follow up Visits: We will see or speak with you next year for your Next Medicare AWV with our clinical staff Have you seen your provider in the last 6 months (3 months if uncontrolled diabetes)? Yes  Clinician Recommendations:  Aim for 30 minutes of exercise or brisk walking, 6-8 glasses of water, and 5 servings of fruits and vegetables each day.       This is a list of the screenings recommended for you:  Health Maintenance  Topic Date Due   COVID-19 Vaccine (8 - Pfizer risk 2024-25 season) 05/19/2024   Flu Shot  07/24/2024   Pneumococcal Vaccine for age over 93 (2 of 2 - PCV20 or PCV21) 10/23/2026*   Medicare Annual Wellness Visit  08/12/2025   Colon Cancer Screening  07/08/2028   DTaP/Tdap/Td vaccine (4 - Td or Tdap) 10/22/2032   Hepatitis C Screening  Completed   Zoster (Shingles) Vaccine  Completed   HPV Vaccine  Aged Out   Meningitis B Vaccine  Aged Out  *Topic was postponed. The date shown is not the original due date.    Advanced directives: (In Chart) A copy of your advanced directives are scanned into your chart should your provider ever need it. Advance Care Planning is important because it:  [x]  Makes sure you receive the medical care that is consistent with your values, goals, and preferences  [x]  It provides guidance to your family and loved ones and reduces their decisional burden about whether or not they are making the right decisions based on  your wishes.  Follow the link provided in your after visit summary or read over the paperwork we have mailed to you to help you started getting your Advance Directives in place. If you need assistance in completing these, please reach out to us  so that we can help you!  See attachments for Preventive Care and Fall Prevention Tips.

## 2024-09-04 LAB — LIPID PANEL
Cholesterol: 136 (ref 0–200)
HDL: 64 (ref 35–70)
LDL Cholesterol: 58
Triglycerides: 102 (ref 40–160)

## 2024-09-04 LAB — BASIC METABOLIC PANEL WITH GFR
BUN: 12 (ref 4–21)
CO2: 28 — AB (ref 13–22)
Chloride: 105 (ref 99–108)
Creatinine: 1 (ref 0.6–1.3)
Glucose: 104
Potassium: 4.6 meq/L (ref 3.5–5.1)
Sodium: 141 (ref 137–147)

## 2024-09-04 LAB — LAB REPORT - SCANNED
A1c: 6
EGFR: 104
TSH: 2.54 (ref 0.41–5.90)

## 2024-09-04 LAB — CBC AND DIFFERENTIAL
HCT: 43 (ref 41–53)
Hemoglobin: 14.4 (ref 13.5–17.5)
Platelets: 202 K/uL (ref 150–400)
WBC: 4.3

## 2024-09-04 LAB — HEPATIC FUNCTION PANEL
ALT: 19 U/L (ref 10–40)
AST: 21 (ref 14–40)
Alkaline Phosphatase: 91 (ref 25–125)
Bilirubin, Direct: 0.2 (ref 0.01–0.4)
Bilirubin, Total: 0.8

## 2024-09-04 LAB — VITAMIN D 25 HYDROXY (VIT D DEFICIENCY, FRACTURES): Vit D, 25-Hydroxy: 64.7

## 2024-09-04 LAB — COMPREHENSIVE METABOLIC PANEL WITH GFR
Albumin: 4.5 (ref 3.5–5.0)
Calcium: 9.9 (ref 8.7–10.7)
eGFR: 74

## 2024-09-04 LAB — PSA: PSA: 2.32

## 2024-09-04 LAB — CBC: RBC: 4.79 (ref 3.87–5.11)

## 2024-09-04 LAB — HEMOGLOBIN A1C: Hemoglobin A1C: 6

## 2024-09-04 LAB — TSH: TSH: 2.54 (ref 0.41–5.90)

## 2024-11-03 NOTE — Assessment & Plan Note (Signed)
 Encourage heart healthy diet such as MIND or DASH diet, increase exercise, avoid trans fats, simple carbohydrates and processed foods, consider a krill or fish or flaxseed oil cap daily.

## 2024-11-03 NOTE — Progress Notes (Signed)
 "  Subjective:    Patient ID: Christopher Haynes, male    DOB: Jul 03, 1946, 78 y.o.   MRN: [id number]  Chief Complaint  Patient presents with   Medical Management of Chronic Issues    Patient presents today for a 6 month follow-up.    HPI Discussed the use of AI scribe software for clinical note transcription with the patient, who gave verbal consent to proceed.  History of Present Illness Christopher Haynes is a 78 year old male who presents with worsening joint pain and functional limitations.  He has arthritis in his hands, with recent worsening characterized by sharp, needle-like pain in the knuckles. The pain is intermittent but severe, impacting his ability to use power tools due to vibration sensitivity. He is unable to make a fist as some knuckles do not bend fully.  He experiences shoulder pain severe enough to prevent him from throwing a ball overhand. X-rays have been taken, and he is currently undergoing physical therapy.  He has a history of kidney stones, with an episode occurring in August 2024. He passed a stone with a burning sensation but without blood. He was informed of having three more small stones, likely millimeters in size. There has been no follow-up with a urologist, and he has not had recurrent episodes since then.  He mentions a past issue with running due to back, hip, and knee pain, which led him to stop running. He discusses the importance of maintaining joint movement to prevent further degeneration.  He is currently on a statin, though the specific medication and dosage are not mentioned. He also discusses challenges with maintaining a healthy diet, often resorting to convenience foods due to his wife's dietary restrictions.  He reports hearing difficulties, particularly in noisy environments, and is scheduled for a hearing test. He uses hearing aids but still struggles with understanding speech in certain situations.    Past Medical History:  Diagnosis Date    Anxiety 08/05/2017   Arthritis ?   Back pain 08/20/2016   Benign prostatic hyperplasia (BPH) with urinary urgency 02/09/2016   Cataract 2024   Constipation 02/20/2016   Contusion 02/20/2016   Depression    Elevated PSA    H/O measles    Headache    History of colonic polyps 02/09/2016   [facility] roughly 2 years ago. Now on 5 year plan   Hyperglycemia 08/20/2016   Hyperlipidemia, mixed 02/08/2017   Macular degeneration of left eye 02/19/2016   Migraine with visual aura 02/09/2016   Neck pain 08/05/2017   OSA on CPAP    Osteoporosis    Pain in joint, shoulder region 02/19/2016   Peripheral neuropathy 08/05/2017   Sleep apnea 2005   Thyroid  disease    thyroiditis, h/o in 8s    Past Surgical History:  Procedure Laterality Date   ANKLE FRACTURE SURGERY  66   KNEE ARTHROSCOPY WITH SUBCHONDROPLASTY Left 12/13/2017   Procedure: KNEE ARTHROSCOPY WITH SUBCHONDROPLASTY;  Surgeon: Sheril Coy, MD;  Location: [facility];  Service: Orthopedics;  Laterality: Left;   SKIN GRAFT  Age 89   TONSILLECTOMY  Age 89    Family History  Problem Relation Age of Onset   Hypertension Mother    Diabetes Mother    Colon cancer Father    Cancer Father        rectal with mets   Hypertension Brother    Obesity Son    Other Son  fatty liver   Allergic Disorder Son        seasonal   Stroke Paternal Grandfather     Social History   Socioeconomic History   Marital status: Married    Spouse name: Not on file   Number of children: Not on file   Years of education: Not on file   Highest education level: Bachelor's degree (e.g., BA, AB, BS)  Occupational History   Occupation: Real Insurance Account Manager  Tobacco Use   Smoking status: Never   Smokeless tobacco: Never  Substance and Sexual Activity   Alcohol use: Yes    Alcohol/week: 4.0 standard drinks of alcohol    Types: 4 Cans of beer per week   Drug use: No   Sexual activity: Yes    Comment:  lives with wife, work in audiological scientist estate, no dietary restrictions  Other Topics Concern   Not on file  Social History Narrative   Not on file   Social Drivers of Health   Financial Resource Strain: Low Risk  (08/11/2024)   Overall Financial Resource Strain (CARDIA)    Difficulty of Paying Living Expenses: Not very hard  Food Insecurity: No Food Insecurity (08/11/2024)   Hunger Vital Sign    Worried About Running Out of Food in the Last Year: Never true    Ran Out of Food in the Last Year: Never true  Transportation Needs: No Transportation Needs (08/11/2024)   PRAPARE - Administrator, Civil Service (Medical): No    Lack of Transportation (Non-Medical): No  Physical Activity: Insufficiently Active (08/11/2024)   Exercise Vital Sign    Days of Exercise per Week: 5 days    Minutes of Exercise per Session: 20 min  Stress: Stress Concern Present (08/11/2024)   [employer] of Occupational Health - Occupational Stress Questionnaire    Feeling of Stress: Rather much  Social Connections: Socially Isolated (08/11/2024)   Social Connection and Isolation Panel    Frequency of Communication with Friends and Family: Once a week    Frequency of Social Gatherings with Friends and Family: Once a week    Attends Religious Services: Never    Database Administrator or Organizations: No    Attends Engineer, Structural: Not on file    Marital Status: Married  Catering Manager Violence: Not At Risk (08/12/2024)   Humiliation, Afraid, Rape, and Kick questionnaire    Fear of Current or Ex-Partner: No    Emotionally Abused: No    Physically Abused: No    Sexually Abused: No    Outpatient Medications Prior to Visit  Medication Sig Dispense Refill   atorvastatin (LIPITOR) 20 MG tablet Take 20 mg by mouth daily.     Calcium Carbonate (CALCIUM 500 PO) Take by mouth.     Cholecalciferol 50 MCG (2000 UT) TABS Take 1 tablet by mouth daily.     cyclobenzaprine  (FLEXERIL ) 10 MG tablet  TAKE ONE TABLET BY MOUTH AT BEDTIME AS NEEDED FOR MUSCLE SPASMS     diclofenac sodium (VOLTAREN) 1 % GEL Apply 2 g topically 4 (four) times daily.     EPINEPHrine  0.3 mg/0.3 mL IJ SOAJ injection INJECT 0.3 ML 0.3MG /0.3ML INTRAMUSCULARLY ONCE AS NEEDED USE FOR INSTANCES OF THROAT SWELLING AND DIFFICULTY BREATHING--THEN GO TO  ER USE FOR INSTANCES OF THROAT SWELLING AND DIFFICULTY BREATHING--THEN GO TO   ER     famotidine  (PEPCID ) 20 MG tablet Take 1 tablet (20 mg total) by mouth 2 (two) times daily. 60  tablet    finasteride  (PROSCAR ) 5 MG tablet Take 1 tablet (5 mg total) by mouth daily. 90 tablet 3   sildenafil  (VIAGRA ) 100 MG tablet Take 1 tablet (100 mg total) by mouth 60 minutes prior to intercourse. 10 tablet 11   tamsulosin (FLOMAX) 0.4 MG CAPS capsule Take 0.8 mg by mouth daily.      traMADol  (ULTRAM ) 50 MG tablet Take 1 tablet (50 mg total) by mouth daily as needed. 30 tablet 0   traZODone (DESYREL) 100 MG tablet Take by mouth.     No facility-administered medications prior to visit.    Allergies  Allergen Reactions   Sumatriptan Nausea And Vomiting   Tolmetin     Other reaction(s): Other (See Comments)    Review of Systems  Constitutional:  Negative for fever and malaise/fatigue.  HENT:  Positive for hearing loss. Negative for congestion and tinnitus.   Eyes:  Negative for blurred vision.  Respiratory:  Negative for shortness of breath.   Cardiovascular:  Negative for chest pain, palpitations and leg swelling.  Gastrointestinal:  Negative for abdominal pain, blood in stool and nausea.  Genitourinary:  Negative for dysuria and frequency.  Musculoskeletal:  Positive for back pain and joint pain. Negative for falls.  Skin:  Negative for rash.  Neurological:  Negative for dizziness, loss of consciousness and headaches.  Endo/Heme/Allergies:  Negative for environmental allergies.  Psychiatric/Behavioral:  Negative for depression. The patient is not nervous/anxious.         Objective:    Physical Exam Vitals reviewed.  Constitutional:      Appearance: Normal appearance. He is not ill-appearing.  HENT:     Head: Normocephalic and atraumatic.     Nose: Nose normal.  Eyes:     Conjunctiva/sclera: Conjunctivae normal.  Cardiovascular:     Rate and Rhythm: Normal rate.     Pulses: Normal pulses.     Heart sounds: Normal heart sounds. No murmur heard. Pulmonary:     Effort: Pulmonary effort is normal.     Breath sounds: Normal breath sounds. No wheezing.  Abdominal:     Palpations: Abdomen is soft. There is no mass.     Tenderness: There is no abdominal tenderness.  Musculoskeletal:     Cervical back: Normal range of motion.     Right lower leg: No edema.     Left lower leg: No edema.  Skin:    General: Skin is warm and dry.  Neurological:     General: No focal deficit present.     Mental Status: He is alert and oriented to person, place, and time.  Psychiatric:        Mood and Affect: Mood normal.     BP 128/80   Pulse 67   Temp 97.6 F (36.4 C)   Resp 16   Ht 5' 5 (1.651 m)   Wt 177 lb 6.4 oz (80.5 kg)   SpO2 97%   BMI 29.52 kg/m  Wt Readings from Last 3 Encounters:  11/05/24 177 lb 6.4 oz (80.5 kg)  08/12/24 170 lb (77.1 kg)  04/23/24 178 lb (80.7 kg)    Diabetic Foot Exam - Simple   No data filed    Lab Results  Component Value Date   WBC 4.3 09/04/2024   HGB 14.4 09/04/2024   HCT 43 09/04/2024   PLT 202 09/04/2024   GLUCOSE 130 (H) 08/04/2023   CHOL 136 09/04/2024   TRIG 102 09/04/2024   HDL 64 09/04/2024  LDLCALC 58 09/04/2024   ALT 19 09/04/2024   AST 21 09/04/2024   NA 141 09/04/2024   K 4.6 09/04/2024   CL 105 09/04/2024   CREATININE 1.0 09/04/2024   BUN 12 09/04/2024   CO2 28 (A) 09/04/2024   TSH 2.54 09/04/2024   PSA 2.32 09/04/2024   HGBA1C 6.0 09/04/2024    Lab Results  Component Value Date   TSH 2.54 09/04/2024   Lab Results  Component Value Date   WBC 4.3 09/04/2024   HGB 14.4 09/04/2024    HCT 43 09/04/2024   MCV 88.6 08/04/2023   PLT 202 09/04/2024   Lab Results  Component Value Date   NA 141 09/04/2024   K 4.6 09/04/2024   CO2 28 (A) 09/04/2024   GLUCOSE 130 (H) 08/04/2023   BUN 12 09/04/2024   CREATININE 1.0 09/04/2024   BILITOT 0.7 08/04/2023   ALKPHOS 91 09/04/2024   AST 21 09/04/2024   ALT 19 09/04/2024   PROT 6.5 08/04/2023   ALBUMIN 4.5 09/04/2024   CALCIUM 9.9 09/04/2024   ANIONGAP 10 08/04/2023   EGFR 74 09/04/2024   GFR 82.70 06/01/2021   Lab Results  Component Value Date   CHOL 136 09/04/2024   Lab Results  Component Value Date   HDL 64 09/04/2024   Lab Results  Component Value Date   LDLCALC 58 09/04/2024   Lab Results  Component Value Date   TRIG 102 09/04/2024   Lab Results  Component Value Date   CHOLHDL 4 06/01/2021   Lab Results  Component Value Date   HGBA1C 6.0 09/04/2024       Assessment & Plan:  Hyperglycemia Assessment & Plan: hgba1c acceptable, minimize simple carbs. Increase exercise as tolerated.   Hyperlipidemia, mixed Assessment & Plan: Encourage heart healthy diet such as MIND or DASH diet, increase exercise, avoid trans fats, simple carbohydrates and processed foods, consider a krill or fish or flaxseed oil cap daily.    Obesity, unspecified class, unspecified obesity type, unspecified whether serious comorbidity present Assessment & Plan: Encouraged DASH or MIND diet, decrease po intake and increase exercise as tolerated. Needs 7-8 hours of sleep nightly. Avoid trans fats, eat small, frequent meals every 4-5 hours with lean proteins, complex carbs and healthy fats. Minimize simple carbs, high fat foods and processed foods   Vitamin D  deficiency Assessment & Plan: Supplement and monitor     Assessment and Plan Assessment & Plan Osteoarthritis involving hands, shoulders, knees, hips, and back Chronic osteoarthritis with severe symptoms in hands and shoulders, including sharp pain in knuckles and  inability to make a fist. Symptoms exacerbated by vibration from power tools. Current management includes physical therapy. Emphasis on maintaining joint mobility to slow degeneration. Discussed the importance of using joints to prevent further decay and the potential for future interventions such as injections or medications if physical therapy is insufficient. - Continue physical therapy for joint mobility and strengthening. - Encouraged use of stress balls and intentional joint movement exercises. - Will consider future interventions such as injections or medications if physical therapy is insufficient.  Mixed hyperlipidemia Cholesterol levels are well-controlled with current medication regimen.  Obesity Weight management is a concern, with dietary habits needing improvement. Discussed the benefits of the Mediterranean and MIND diets in reducing risk of chronic diseases. - Encouraged adoption of Mediterranean or MIND diet to improve overall health and reduce risk of chronic diseases.  Vitamin D  deficiency Vitamin D  levels are within normal range with current supplementation.  History of nephrolithiasis Previous episode of kidney stones in August 2024, with small stones passing without significant complications. No recurrence since then. Discussed the importance of hydration in preventing future episodes. - Encouraged consistent hydration throughout the day to prevent recurrence. - Will consider urology referral if stones recur.  Hearing loss Progressive hearing loss with difficulty understanding speech, especially in noisy environments. Current hearing aids are not fully effective in crowded settings. Discussed the importance of hearing aids in preventing dementia and maintaining social engagement. - Continue with hearing aid use and follow up with hearing evaluation appointment.  General Health Maintenance Routine health maintenance is up to date. Discussed new guidelines for  pneumococcal vaccination and potential future need for Prevnar 20 booster. - Will consider Prevnar 20 booster in 2027 if guidelines remain unchanged. - Continue routine health maintenance and vaccinations as per guidelines.  Recording duration: 20 minutes     Harlene Horton, MD "

## 2024-11-03 NOTE — Assessment & Plan Note (Signed)
 hgba1c acceptable, minimize simple carbs. Increase exercise as tolerated.

## 2024-11-03 NOTE — Assessment & Plan Note (Signed)
 Supplement and monitor

## 2024-11-03 NOTE — Assessment & Plan Note (Signed)
 Encouraged DASH or MIND diet, decrease po intake and increase exercise as tolerated. Needs 7-8 hours of sleep nightly. Avoid trans fats, eat small, frequent meals every 4-5 hours with lean proteins, complex carbs and healthy fats. Minimize simple carbs, high fat foods and processed foods

## 2024-11-05 ENCOUNTER — Encounter: Payer: Self-pay | Admitting: Family Medicine

## 2024-11-05 ENCOUNTER — Ambulatory Visit (INDEPENDENT_AMBULATORY_CARE_PROVIDER_SITE_OTHER): Admitting: Family Medicine

## 2024-11-05 VITALS — BP 128/80 | HR 67 | Temp 97.6°F | Resp 16 | Ht 65.0 in | Wt 177.4 lb

## 2024-11-05 DIAGNOSIS — E559 Vitamin D deficiency, unspecified: Secondary | ICD-10-CM | POA: Diagnosis not present

## 2024-11-05 DIAGNOSIS — R739 Hyperglycemia, unspecified: Secondary | ICD-10-CM

## 2024-11-05 DIAGNOSIS — E782 Mixed hyperlipidemia: Secondary | ICD-10-CM | POA: Diagnosis not present

## 2024-11-05 DIAGNOSIS — E669 Obesity, unspecified: Secondary | ICD-10-CM

## 2024-11-05 NOTE — Patient Instructions (Addendum)
 Mediterranean or MIND diet  Mediterranean Diet A Mediterranean diet is based on the traditions of countries on the [employer]. It focuses on eating more: Fruits and vegetables. Whole grains, beans, nuts, and seeds. Heart-healthy fats. These are fats that are good for your heart. It involves eating less: Dairy. Meat and eggs. Processed foods with added sugar, salt, and fat. This type of diet can help prevent certain conditions. It can also improve outcomes if you have a long-term (chronic) disease, such as kidney or heart disease. What are tips for following this plan? Reading food labels Check packaged foods for: The serving size. For foods such as rice and pasta, the serving size is the amount of cooked product, not dry. The total fat. Avoid foods with saturated fat or trans fat. Added sugars, such as corn syrup. Shopping  Try to have a balanced diet. Buy a variety of foods, such as: Fresh fruits and vegetables. You may be able to get these from local farmers markets. You can also buy them frozen. Grains, beans, nuts, and seeds. Some of these can be bought in bulk. Fresh seafood. Poultry and eggs. Low-fat dairy products. Buy whole ingredients instead of foods that have already been packaged. If you can't get fresh seafood, buy precooked frozen shrimp or canned fish, such as tuna, salmon, or sardines. Stock your pantry so you always have certain foods on hand, such as olive oil, canned tuna, canned tomatoes, rice, pasta, and beans. Cooking Cook foods with extra-virgin olive oil instead of using butter or other vegetable oils. Have meat as a side dish. Have vegetables or grains as your main dish. This means having meat in small portions or adding small amounts of meat to foods like pasta or stew. Use beans or vegetables instead of meat in common dishes like chili or lasagna. Try out different cooking methods. Try roasting, broiling, steaming, and sauting vegetables. Add  frozen vegetables to soups, stews, pasta, or rice. Add nuts or seeds for added healthy fats and plant protein at each meal. You can add these to yogurt, salads, or vegetable dishes. Marinate fish or vegetables using olive oil, lemon juice, garlic, and fresh herbs. Meal planning Plan to eat a vegetarian meal one day each week. Try to work up to two vegetarian meals, if possible. Eat seafood two or more times a week. Have healthy snacks on hand. These may include: Vegetable sticks with hummus. Greek yogurt. Fruit and nut trail mix. Eat balanced meals. These should include: Fruit: 2-3 servings a day. Vegetables: 4-5 servings a day. Low-fat dairy: 2 servings a day. Fish, poultry, or lean meat: 1 serving a day. Beans and legumes: 2 or more servings a week. Nuts and seeds: 1-2 servings a day. Whole grains: 6-8 servings a day. Extra-virgin olive oil: 3-4 servings a day. Limit red meat and sweets to just a few servings a month. Lifestyle  Try to cook and eat meals with your family. Drink enough fluid to keep your pee (urine) pale yellow. Be active every day. This includes: Aerobic exercise, which is exercise that causes your heart to beat faster. Examples include running and swimming. Leisure activities like gardening, walking, or housework. Get 7-8 hours of sleep each night. Drink red wine if your provider says you can. A glass of wine is 5 oz (150 mL). You may be allowed to have: Up to 1 glass a day if you're male and not pregnant. Up to 2 glasses a day if you're male. What foods should I  eat? Fruits Apples. Apricots. Avocado. Berries. Bananas. Cherries. Dates. Figs. Grapes. Lemons. Melon. Oranges. Peaches. Plums. Pomegranate. Vegetables Artichokes. Beets. Broccoli. Cabbage. Carrots. Eggplant. Green beans. Chard. Kale. Spinach. Onions. Leeks. Peas. Squash. Tomatoes. Peppers. Radishes. Grains Whole-grain pasta. Brown rice. Bulgur wheat. Polenta. Couscous. Whole-wheat bread. [place] Madeira. Meats and other proteins Beans. Almonds. Sunflower seeds. Pine nuts. Peanuts. Cod. Salmon. Scallops. Shrimp. Tuna. Tilapia. Clams. Oysters. Eggs. Chicken or turkey without skin. Dairy Low-fat milk. Cheese. Greek yogurt. Fats and oils Extra-virgin olive oil. Avocado oil. Grapeseed oil. Beverages Water. Red wine. Herbal tea. Sweets and desserts Greek yogurt with honey. Baked apples. Poached pears. Trail mix. Seasonings and condiments Basil. Cilantro. Coriander. Cumin. Mint. Parsley. Sage. Rosemary. Tarragon. Garlic. Oregano. Thyme. Pepper. Balsamic vinegar. Tahini. Hummus. Tomato sauce. Olives. Mushrooms. The items listed above may not be all the foods and drinks you can have. Talk to a dietitian to learn more. What foods should I limit? This is a list of foods that should be eaten rarely. Fruits Fruit canned in syrup. Vegetables Deep-fried potatoes, like French fries. Grains Packaged pasta or rice dishes. Cereal with added sugar. Snacks with added sugar. Meats and other proteins Beef. Pork. Lamb. Chicken or turkey with skin. Hot dogs. [place]. Dairy Ice cream. Sour cream. Whole milk. Fats and oils Butter. Canola oil. Vegetable oil. Beef fat (tallow). Lard. Beverages Juice. Sugar-sweetened soft drinks. Beer. Liquor and spirits. Sweets and desserts Cookies. Cakes. Pies. Candy. Seasonings and condiments Mayonnaise. Pre-made sauces and marinades. The items listed above may not be all the foods and drinks you should limit. Talk to a dietitian to learn more. Where to find more information American Heart Association (AHA): heart.org This information is not intended to replace advice given to you by your health care provider. Make sure you discuss any questions you have with your health care provider. Document Revised: 03/24/2023 Document Reviewed: 03/24/2023 Elsevier Patient Education  2024 [employer]    Kidney Stones  Kidney stones are solid, rock-like deposits that  form inside of the kidneys. The kidneys are a pair of organs that make urine. A kidney stone may form in a kidney and move into other parts of the urinary tract, including the tubes that connect the kidneys to the bladder (ureters), the bladder, and the tube that carries urine out of the body (urethra). As the stone moves through these areas, it can cause intense pain and block the flow of urine. Kidney stones are created when high levels of certain minerals are found in the urine. The stones are usually passed out of the body through urination, but in some cases, medical treatment may be needed to remove them. What are the causes? Kidney stones may be caused by: A condition in which certain glands produce too much parathyroid hormone (primary hyperparathyroidism), which causes too much calcium buildup in the blood. A buildup of uric acid crystals in the bladder (hyperuricosuria). Uric acid is a chemical that the body produces when you eat certain foods. It usually leaves the body in the urine. Narrowing (stricture) of one or both of the ureters. A kidney blockage that is present at birth (congenital obstruction). Past surgery on the kidney or the ureters. What increases the risk? The following factors may make you more likely to develop this condition: Having had a kidney stone in the past. Having a family history of kidney stones. Not drinking enough water. Eating a diet that is high in protein, salt (sodium), or sugar. Being overweight or obese. What are the  signs or symptoms? Symptoms of a kidney stone may include: Pain in the side of the abdomen, right below the ribs (flank pain). Pain usually spreads (radiates) to the groin. Needing to urinate often or urgently. Painful urination. Blood in the urine (hematuria). Nausea. Vomiting. Fever and chills. How is this diagnosed? This condition may be diagnosed based on: Your symptoms and medical history. A physical exam. Blood tests. Urine  tests. These may be done before and after the stone passes out of your body through urination. Imaging tests, such as a CT scan, abdominal X-ray, or ultrasound. A procedure to examine the inside of the bladder (cystoscopy). How is this treated? Treatment for kidney stones depends on the size, location, and makeup of the stones. Kidney stones will often pass out of the body through urination. You may need to: Increase your fluid intake to help pass the stone. In some cases, you may be given fluids through an IV and may need to be monitored in the hospital. Take medicine for pain. Make changes in your diet to help prevent kidney stones from coming back. Sometimes, procedures are needed to remove a kidney stone. This may involve: A procedure to break up kidney stones using: A focused beam of light (laser therapy). Shock waves (extracorporeal shock wave lithotripsy). Surgery to remove kidney stones. This may be needed if you have severe pain or have stones that block your urinary tract. Follow these instructions at home: Medicines Take over-the-counter and prescription medicines only as told by your health care provider. Ask your health care provider if the medicine prescribed to you requires you to avoid driving or using heavy machinery. Eating and drinking Drink enough fluid to keep your urine pale yellow. You may be instructed to drink at least 8-10 glasses of water each day. This will help you pass the kidney stone. If directed, change your diet. This may include: Limiting how much sodium you eat. Eating more fruits and vegetables. Limiting how much animal protein you eat. Animal proteins include red meat, poultry, fish, and eggs. Eating a normal amount of calcium (1,000-1,300 mg per day). Follow instructions from your health care provider about eating or drinking restrictions. General instructions Collect urine samples as told by your health care provider. You may need to collect a urine  sample: 24 hours after you pass the stone. 8-12 weeks after you pass the kidney stone, and every 6-12 months after that. Strain your urine every time you urinate, for as long as directed. Use the strainer that your health care provider recommends. Do not throw out the kidney stone after passing it. Keep the stone so it can be tested by your health care provider. Testing the makeup of your kidney stone may help prevent you from getting kidney stones in the future. Keep all follow-up visits. You may need follow-up X-rays or ultrasounds to make sure that your stone has passed. How is this prevented? To prevent another kidney stone: Drink enough fluid to keep your urine pale yellow. This is the best way to prevent kidney stones. Eat a healthy diet. Follow recommendations from your health care provider about foods to avoid. Recommendations vary depending on the type of kidney stone that you have. You may be instructed to eat a low-protein diet. Maintain a healthy weight. Where to find more information National Kidney Foundation (NKF): www.kidney.org [facility]): www.urologyhealth.org Contact a health care provider if: You have pain that gets worse or does not get better with medicine. Get help right  away if: You have a fever or chills. You develop severe pain. You develop new abdominal pain. You faint. You are unable to urinate. Summary Kidney stones are solid, rock-like deposits that form inside of the kidneys. Kidney stones can cause nausea, vomiting, blood in the urine, abdominal pain, and the urge to urinate often. Treatment for kidney stones depends on the size, location, and makeup of the stones. Kidney stones will often pass out of the body through urination. Kidney stones can be prevented by drinking enough fluids, eating a healthy diet, and maintaining a healthy weight. This information is not intended to replace advice given to you by your health care provider. Make  sure you discuss any questions you have with your health care provider. Document Revised: 03/21/2022 Document Reviewed: 03/21/2022 Elsevier Patient Education  2024 [employer].

## 2024-11-06 ENCOUNTER — Encounter: Payer: Self-pay | Admitting: Family Medicine

## 2024-11-10 DIAGNOSIS — N138 Other obstructive and reflux uropathy: Secondary | ICD-10-CM | POA: Diagnosis not present

## 2024-11-10 DIAGNOSIS — R972 Elevated prostate specific antigen [PSA]: Secondary | ICD-10-CM | POA: Diagnosis not present

## 2024-11-10 DIAGNOSIS — N529 Male erectile dysfunction, unspecified: Secondary | ICD-10-CM | POA: Diagnosis not present

## 2024-11-10 DIAGNOSIS — N401 Enlarged prostate with lower urinary tract symptoms: Secondary | ICD-10-CM | POA: Diagnosis not present

## 2024-11-10 DIAGNOSIS — N400 Enlarged prostate without lower urinary tract symptoms: Secondary | ICD-10-CM | POA: Diagnosis not present

## 2025-05-27 ENCOUNTER — Encounter: Admitting: Student

## 2025-06-07 ENCOUNTER — Encounter: Admitting: Family Medicine
